# Patient Record
Sex: Female | Born: 1940 | Race: Black or African American | Hispanic: No | Smoking: Never smoker
Health system: Southern US, Community
[De-identification: ages and names within clinical notes are randomized; demographics above are authoritative.]

## PROBLEM LIST (undated history)

## (undated) DIAGNOSIS — E7800 Pure hypercholesterolemia, unspecified: Secondary | ICD-10-CM

## (undated) DIAGNOSIS — Z9889 Other specified postprocedural states: Secondary | ICD-10-CM

## (undated) DIAGNOSIS — F329 Major depressive disorder, single episode, unspecified: Secondary | ICD-10-CM

## (undated) DIAGNOSIS — M1990 Unspecified osteoarthritis, unspecified site: Secondary | ICD-10-CM

## (undated) DIAGNOSIS — F32A Depression, unspecified: Secondary | ICD-10-CM

## (undated) DIAGNOSIS — I10 Essential (primary) hypertension: Secondary | ICD-10-CM

## (undated) HISTORY — PX: UTERINE FIBROID SURGERY: SHX826

## (undated) HISTORY — DX: Essential (primary) hypertension: I10

## (undated) HISTORY — PX: TOTAL ABDOMINAL HYSTERECTOMY: SHX209

## (undated) HISTORY — DX: Pure hypercholesterolemia, unspecified: E78.00

## (undated) HISTORY — DX: Other specified postprocedural states: Z98.890

---

## 2001-07-26 ENCOUNTER — Encounter: Payer: Self-pay | Admitting: *Deleted

## 2001-07-26 ENCOUNTER — Ambulatory Visit (HOSPITAL_COMMUNITY): Admission: RE | Admit: 2001-07-26 | Discharge: 2001-07-26 | Payer: Self-pay | Admitting: *Deleted

## 2002-08-08 ENCOUNTER — Ambulatory Visit (HOSPITAL_COMMUNITY): Admission: RE | Admit: 2002-08-08 | Discharge: 2002-08-08 | Payer: Self-pay | Admitting: *Deleted

## 2002-08-08 ENCOUNTER — Encounter: Payer: Self-pay | Admitting: *Deleted

## 2003-08-19 ENCOUNTER — Ambulatory Visit (HOSPITAL_COMMUNITY): Admission: RE | Admit: 2003-08-19 | Discharge: 2003-08-19 | Payer: Self-pay | Admitting: Family Medicine

## 2004-08-31 ENCOUNTER — Ambulatory Visit (HOSPITAL_COMMUNITY): Admission: RE | Admit: 2004-08-31 | Discharge: 2004-08-31 | Payer: Self-pay | Admitting: Family Medicine

## 2005-02-17 ENCOUNTER — Ambulatory Visit: Payer: Self-pay | Admitting: Internal Medicine

## 2005-02-17 ENCOUNTER — Ambulatory Visit (HOSPITAL_COMMUNITY): Admission: RE | Admit: 2005-02-17 | Discharge: 2005-02-17 | Payer: Self-pay | Admitting: Internal Medicine

## 2005-02-17 HISTORY — PX: COLONOSCOPY: SHX174

## 2005-09-05 ENCOUNTER — Ambulatory Visit (HOSPITAL_COMMUNITY): Admission: RE | Admit: 2005-09-05 | Discharge: 2005-09-05 | Payer: Self-pay | Admitting: Family Medicine

## 2006-10-02 ENCOUNTER — Ambulatory Visit (HOSPITAL_COMMUNITY): Admission: RE | Admit: 2006-10-02 | Discharge: 2006-10-02 | Payer: Self-pay | Admitting: Family Medicine

## 2007-10-24 DIAGNOSIS — Z9889 Other specified postprocedural states: Secondary | ICD-10-CM

## 2007-10-24 HISTORY — DX: Other specified postprocedural states: Z98.890

## 2007-11-01 ENCOUNTER — Ambulatory Visit (HOSPITAL_COMMUNITY): Admission: RE | Admit: 2007-11-01 | Discharge: 2007-11-01 | Payer: Self-pay | Admitting: Family Medicine

## 2008-02-17 ENCOUNTER — Ambulatory Visit (HOSPITAL_COMMUNITY): Admission: RE | Admit: 2008-02-17 | Discharge: 2008-02-17 | Payer: Self-pay | Admitting: Internal Medicine

## 2008-02-17 ENCOUNTER — Encounter: Payer: Self-pay | Admitting: Internal Medicine

## 2008-02-17 ENCOUNTER — Ambulatory Visit: Payer: Self-pay | Admitting: Internal Medicine

## 2008-02-18 HISTORY — PX: COLONOSCOPY: SHX174

## 2008-11-06 ENCOUNTER — Ambulatory Visit (HOSPITAL_COMMUNITY): Admission: RE | Admit: 2008-11-06 | Discharge: 2008-11-06 | Payer: Self-pay | Admitting: Family Medicine

## 2009-11-08 ENCOUNTER — Ambulatory Visit (HOSPITAL_COMMUNITY): Admission: RE | Admit: 2009-11-08 | Discharge: 2009-11-08 | Payer: Self-pay | Admitting: Family Medicine

## 2010-11-10 ENCOUNTER — Ambulatory Visit (HOSPITAL_COMMUNITY)
Admission: RE | Admit: 2010-11-10 | Discharge: 2010-11-10 | Payer: Self-pay | Source: Home / Self Care | Attending: Family Medicine | Admitting: Family Medicine

## 2010-11-13 ENCOUNTER — Encounter: Payer: Self-pay | Admitting: Family Medicine

## 2010-11-23 ENCOUNTER — Encounter: Payer: Self-pay | Admitting: Family Medicine

## 2011-01-18 ENCOUNTER — Ambulatory Visit (INDEPENDENT_AMBULATORY_CARE_PROVIDER_SITE_OTHER): Payer: Medicare Other | Admitting: Gastroenterology

## 2011-01-18 ENCOUNTER — Encounter: Payer: Self-pay | Admitting: Gastroenterology

## 2011-01-18 VITALS — BP 122/78 | HR 104 | Temp 97.2°F | Ht 68.0 in | Wt 230.0 lb

## 2011-01-18 DIAGNOSIS — K6289 Other specified diseases of anus and rectum: Secondary | ICD-10-CM

## 2011-01-18 DIAGNOSIS — K629 Disease of anus and rectum, unspecified: Secondary | ICD-10-CM

## 2011-01-18 NOTE — Progress Notes (Signed)
Denies abdominal pain, no N/V, no GERD. No dysphagia, odynophagia. Has BM once/day. No blood in stool.  Noticed a "knot" a few weeks ago. Tried preparation H, still did not go away. Was using bathroom and had small amount of soreness, says it "moves". No itching.

## 2011-01-19 ENCOUNTER — Encounter: Payer: Medicare Other | Admitting: Internal Medicine

## 2011-01-19 DIAGNOSIS — K629 Disease of anus and rectum, unspecified: Secondary | ICD-10-CM

## 2011-01-19 LAB — IFOBT (OCCULT BLOOD): IFOBT: NEGATIVE

## 2011-01-19 NOTE — Progress Notes (Signed)
Referring Provider: No ref. provider found Primary Care Physician:  COMSTOCK,[place], [place]  Chief Complaint  Patient presents with  . Rectal Pain    taking shower and "checked" rectum and found a "knott"    HPI:  [name] is a  70 year old African American female who presents today with concerns regarding a knot in her rectum. She has essentially has no other complaints today. She denies any abdominal pain, nausea or vomiting. She has one bowel movement daily. She has seen no melena or bright red blood per rectum. She reports that a few weeks ago, at she was having a BM, she had a mild amount of discomfort in the rectum area. She actually digitally inserted her finger to check her rectum and felt a "knot". She used preparation H., but states it is not painful at all. She is just quite concerned about this as she does not understand why there is a knot in her rectum. Her last colonoscopy was performed in 2009. It was noted at that time that she had a rectal polyp and right colon polyp. Otherwise she had a normal rectum and colon. Again the biopsies were noted to be a tubular adenoma. She is due for repeat colonoscopy in 5 years.  Past Medical History  Diagnosis Date  . HTN (hypertension)   . Hypercholesteremia   . S/P colonoscopy 2009    adenoma, repeat 5 years    Past Surgical History  Procedure Date  . Total abdominal hysterectomy     fibroid tumors    Current Outpatient Prescriptions  Medication Sig Dispense Refill  . hydrochlorothiazide 25 MG tablet Take 25 mg by mouth daily.        Marland Kitchen lisinopril (PRINIVIL,ZESTRIL) 5 MG tablet Take 5 mg by mouth daily.        Marland Kitchen lovastatin (MEVACOR) 20 MG tablet Take 20 mg by mouth at bedtime.        . naproxen (NAPROSYN) 500 MG tablet Take 500 mg by mouth 2 (two) times daily with a meal.          Allergies as of 01/18/2011  . (No Known Allergies)    Family History  Problem Relation Age of Onset  . Stroke Mother     deceased  .  Emphysema Father     deceased    History   Social History  . Marital Status: Divorced    Spouse Name: N/A    Number of Children: N/A  . 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 Smoking status: Never Smoker   . Smokeless tobacco: Never Used  . Alcohol Use: No  . Drug Use: No  . Sexually Active: No   Other Topics Concern  . Not on file   Social History Narrative  . No narrative on file    Review of Systems: Gen: Denies any fever, chills, sweats, anorexia CV: Denies chest pain, angina, palpitations, syncope, orthopnea, PND, peripheral edema Resp: Denies dyspnea at rest, wheezing,  pleurisy. GI: SEE HPI Derm: Denies rash, itching, dry skin Psych: Denies depression, anxiety, memory loss, suicidal ideation, confusion Heme: Denies bruising, bleeding, and enlarged lymph nodes.  Physical Exam: BP 122/78  Pulse 104  Temp 97.2 F (36.2 C)  Ht 5\' 8"  (1.727 m)  Wt 230 lb (104.327 kg)  BMI 34.97 kg/m2 General:   Alert,  Well-developed, well-nourished, pleasant and cooperative in NAD Head:  Normocephalic and  atraumatic. Eyes:  Sclera clear, no icterus.   Conjunctiva pink. Mouth:  No deformity or lesions, dentition normal. Neck:  Supple; no masses or thyromegaly. Heart:  Regular rate and rhythm; no murmurs, clicks, rubs,  or gallops. Abdomen:  Soft, nontender and nondistended. No masses, hepatosplenomegaly or hernias noted. Normal bowel sounds, without guarding, and without rebound.   Msk:  Symmetrical without gross deformities. Normal posture. Rectal: external visualization showed evidence of several hemorrhoid tags, no thrombosed or bleeding hemorrhoids. DRE performed; good sphincter tone. Unable to appreciate any palpable masses; however, there was a small, almost grainy-like palpable area, like a piece of rice, non-fixed, posteriorly in rectal vault. Anoscope was inserted without any difficulties. No obvious lesions,  mass noted. No overt evidence of bleeding. Unable to appreciate supposed area of concern.   Extremities:  Without edema. Neurologic:  Alert and  oriented x4;  grossly normal neurologically. Skin:  Intact without significant lesions or rashes. Cervical Nodes:  No significant cervical adenopathy. Psych:  Alert and cooperative. Normal mood and affect.

## 2011-01-19 NOTE — Assessment & Plan Note (Signed)
70 year old [place] American female who presents with concerns regarding a rectal "knot" that she noticed 2 weeks ago while having a bowel movement. She denies any discomfort, itching or pain currently. She states that she felt the knot herself and was concerned. Denies melena or brbpr.  She has bowel movements every other day. Digital rectal exam revealed good sphincter tone and a small posterior area that was non-fixed about the size of a grain of rice. It is quite difficult to characterize what this is. Anoscope was used as well without any evidence of lesion or mass or if overt bleeding.  #1  we will obtain recent outside labs. If she has not had a CBC, we will have to proceed with this. #2 although her last colonoscopy was in 2009, we will proceed with an ifobt to assess for any occult bleeding. #3 after review of her labs and stool sample, we will consider proceeding with a flexible sigmoidoscopy in order to assess her internal rectal vault.

## 2011-01-20 NOTE — Progress Notes (Signed)
Pt aware ifobt is neg. And we will call with any further recomendations

## 2011-01-25 NOTE — Progress Notes (Signed)
You may inform her that we are waiting on outside labs that have been requested. She will need a CBC if not already done at outside facility.

## 2011-02-02 NOTE — Progress Notes (Unsigned)
Please have pt obtain a CBC. Need to assess where she is .

## 2011-02-06 ENCOUNTER — Other Ambulatory Visit: Payer: Self-pay | Admitting: Gastroenterology

## 2011-02-06 DIAGNOSIS — K6289 Other specified diseases of anus and rectum: Secondary | ICD-10-CM

## 2011-02-06 NOTE — Progress Notes (Signed)
Unable to reach pt- lab order and letter mailed to pt

## 2011-02-07 ENCOUNTER — Encounter: Payer: Self-pay | Admitting: Gastroenterology

## 2011-03-07 NOTE — Op Note (Signed)
NAMEDAGMAR, ADCOX               ACCOUNT NO.:  [id number]   MEDICAL RECORD NO.:  [id number]          PATIENT TYPE:  AMB   LOCATION:  DAY                           FACILITY:  [facility]   PHYSICIAN:  R. Roetta Sessions, M.D. DATE OF BIRTH:  02/22/1941   DATE OF PROCEDURE:  DATE OF DISCHARGE:                               OPERATIVE REPORT   INDICATIONS FOR PROCEDURE:  A 70 year old lady with a history of colonic  adenomas removed back in 2006.  She is here for surveillance.  Risks,  benefits, and alternatives have been reviewed, questions answered, and  she is agreeable.  Please see the documentation in the medical record.   PROCEDURE NOTE:  O2 saturation, blood pressure, pulse, and respirations  were monitored throughout the entire procedure.   CONSCIOUS SEDATION:  Versed 5 mg IV and Demerol 100 g IV in divided  doses.   INSTRUMENT:  Pentax video chip system.   FINDINGS:  Digital rectal exam revealed no abnormalities.  Endoscopic  findings:  The prep was good.  Colon:  Colonic mucosa was surveyed from  the rectosigmoid junction through the left, transverse, right colon,  to  the appendiceal orifice, ileocecal valve, and cecum.  These structures  were well seen and photographed for the record.  From this level, the  scope was slowly cautiously withdrawn.  All previously mentioned mucosal  surfaces were again seen.  The patient had a 7-mm polyp in the hepatic  flexure which was cold snared.  There were 2 diminutive polyps at the  base of the cecum, which were cold biopsied/removed.  The remainder of  the colonic mucosa appeared normal.  The patient had a relatively  haustral left and transverse segments which made the exam straight  forward.  The scope was pulled down to the rectum where thorough  examination of rectal mucosa including retroflexed view of the anal  verge demonstrated no abnormalities.  The patient tolerated the  procedure well and was reactive in endoscopy.   IMPRESSION:  1. Normal rectum.  2. Polyp in the hepatic flexure removed with snare.  Remainder of the      cecal polyps were cold biopsied/removed; remainder of the colonic      mucosa appeared normal.   RECOMMENDATIONS:  1. Follow up on path.  2. Further recommendations to follow.       Jonathon Bellows, M.D.  Electronically Signed     RMR/MEDQ  D:  02/17/2008  T:  02/18/2008  Job:  [id number]

## 2011-03-10 NOTE — Op Note (Signed)
[name], [name]               ACCOUNT NO.:  [id number]   MEDICAL RECORD NO.:  [id number]          PATIENT TYPE:  AMB   LOCATION:  DAY                           FACILITY:  [facility]   PHYSICIAN:  R. Roetta Sessions, M.D. DATE OF BIRTH:  10/11/1943   DATE OF PROCEDURE:  02/17/2005  DATE OF DISCHARGE:                                 OPERATIVE REPORT   PROCEDURE:  Colonoscopy with snare polypectomy.   INDICATION FOR PROCEDURE:  The patient is a 70 year old lady who is referred  out of courtesy of Dr. Bethena Midget in [place] for colorectal cancer  screening.  I performed a sigmoidoscopy on her back in 2000 for screening.  She had a negative exam to 60 cm.  She is devoid of any lower GI tract  symptoms.  Family history continues to be negative for colorectal neoplasia.  Colonoscopy is now being done as a screening maneuver.  This approach has  been discussed with the patient at length, the potential risks, benefits,  and alternatives have been reviewed, questions answered.  She is agreeable.  Please see documentation in the medical record.   PROCEDURE NOTE:  O2 saturation, blood pressure, pulse and respirations were  monitored throughout the entire procedure.   CONSCIOUS SEDATION:  Demerol 75 mg IV, Versed 4 mg IV in divided doses.   INSTRUMENT USED:  Olympus video chip system.   FINDINGS:  Digital rectal exam revealed no abnormalities.  Endoscopic  findings:  Prep was good.   Rectum:  Examination of the rectal mucosa including retroflexion of the anal  verge revealed a 4 mm polyp in at 15 cm from the anal verge.  Please see  photos.  The remainder of the rectal mucosa appeared normal.   Colon:  The colonic mucosa was surveyed from the rectosigmoid junction  through the left, transverse and right colon to the area of the appendiceal  orifice, ileocecal valve and cecum.  These structures were well-seen and  photographed for the record.  From this level the scope was slowly  withdrawn  and all previously-mentioned mucosal surfaces were again seen.  The patient  was noted to have a 7 mm polyp between two folds in the right colon just 5  cm distal to the ileocecal valve.  This lesion was resected with snare  cautery and recovered through the scope.  The remainder of the colonic  mucosa appeared normal.  The rectal polyp likewise was removed with snare  cautery and recovered through the scope.  The patient tolerated the  procedure very well, was reacted in endoscopy.   IMPRESSION:  1.  Rectal polyp and right colon polyp, status post snare polypectomy.  2.  Otherwise normal rectum and colon.   RECOMMENDATIONS:  1.  No aspirin or arthritis medications for the next 10 days.  2.  Follow up on pathology.  3.  Further recommendations to follow.      RMR/MEDQ  D:  02/17/2005  T:  02/17/2005  Job:  [id number]   cc:   Bethena Midget, M.D.  [facility]  [place], [place]

## 2011-05-09 NOTE — Telephone Encounter (Signed)
Opened in error

## 2011-10-27 ENCOUNTER — Other Ambulatory Visit (HOSPITAL_COMMUNITY): Payer: Self-pay | Admitting: Family Medicine

## 2011-10-27 DIAGNOSIS — Z139 Encounter for screening, unspecified: Secondary | ICD-10-CM

## 2011-11-13 ENCOUNTER — Ambulatory Visit (HOSPITAL_COMMUNITY)
Admission: RE | Admit: 2011-11-13 | Discharge: 2011-11-13 | Disposition: A | Discharge status: Home / Self Care | Payer: Medicare Other | Source: Ambulatory Visit | Attending: Family Medicine | Admitting: Family Medicine

## 2011-11-13 DIAGNOSIS — Z1231 Encounter for screening mammogram for malignant neoplasm of breast: Secondary | ICD-10-CM | POA: Insufficient documentation

## 2011-11-13 DIAGNOSIS — Z139 Encounter for screening, unspecified: Secondary | ICD-10-CM

## 2012-02-13 ENCOUNTER — Telehealth: Payer: Self-pay | Admitting: Gastroenterology

## 2012-02-13 NOTE — Telephone Encounter (Signed)
After review of chart, no labs performed when last seen. ifobt was negative, however. Let's have pt return for a routine visit (non-urgent) to see how she is doing.  Reason: f/u rectal concerns, consider TCS if indicated.

## 2012-02-22 ENCOUNTER — Encounter: Payer: Self-pay | Admitting: Internal Medicine

## 2012-02-22 NOTE — Telephone Encounter (Signed)
Mailed letter to patient to call office to set up OV °

## 2012-10-23 ENCOUNTER — Other Ambulatory Visit: Payer: Self-pay | Admitting: Occupational Therapy

## 2012-10-23 ENCOUNTER — Other Ambulatory Visit (HOSPITAL_COMMUNITY): Payer: Self-pay | Admitting: Nurse Practitioner

## 2012-10-23 DIAGNOSIS — Z139 Encounter for screening, unspecified: Secondary | ICD-10-CM

## 2012-11-12 ENCOUNTER — Ambulatory Visit (HOSPITAL_COMMUNITY): Payer: Medicare Other

## 2012-11-14 ENCOUNTER — Ambulatory Visit (HOSPITAL_COMMUNITY)
Admission: RE | Admit: 2012-11-14 | Discharge: 2012-11-14 | Disposition: A | Discharge status: Home / Self Care | Payer: Medicare Other | Source: Ambulatory Visit | Attending: Nurse Practitioner | Admitting: Nurse Practitioner

## 2012-11-14 DIAGNOSIS — Z1231 Encounter for screening mammogram for malignant neoplasm of breast: Secondary | ICD-10-CM | POA: Insufficient documentation

## 2012-11-14 DIAGNOSIS — Z139 Encounter for screening, unspecified: Secondary | ICD-10-CM

## 2013-03-04 ENCOUNTER — Telehealth: Payer: Self-pay | Admitting: Internal Medicine

## 2013-03-04 NOTE — Telephone Encounter (Signed)
I called and spoke to pt and she has OV with Gerrit Halls, NP on 03/18/2013 at 2:30 PM. ( She has hx of tubular adenoma ) She is asking to have an appt letter mailed to her with address and time and I am doing so now.

## 2013-03-04 NOTE — Telephone Encounter (Signed)
Thank you :)

## 2013-03-04 NOTE — Telephone Encounter (Signed)
Pt called to see when we were going to set up her next colonoscopy. I told her that see was due this month for a 5 year colonoscopy and she would need to come in for an OV first prior to setting her up. She refuses OV because she has never had to come in before. I tried to explain that our policy was if you have a Hx of polyps or family history of cancer that we bring patients into the office versus being triaged. She does not want OV and said the nurse can set it up and mail her the information. I tried to tell her that the nurse will need to speak with her to get some information first and she said that the nurse could call her in the afternoons and DO NOT call her in the mornings. Please call and advise after 2pm. [phone]

## 2013-03-14 ENCOUNTER — Encounter: Payer: Self-pay | Admitting: Internal Medicine

## 2013-03-18 ENCOUNTER — Encounter: Payer: Self-pay | Admitting: Gastroenterology

## 2013-03-18 ENCOUNTER — Ambulatory Visit (INDEPENDENT_AMBULATORY_CARE_PROVIDER_SITE_OTHER): Payer: Medicare Other | Admitting: Gastroenterology

## 2013-03-18 VITALS — BP 109/75 | HR 109 | Temp 97.4°F | Ht 69.0 in | Wt 236.6 lb

## 2013-03-18 DIAGNOSIS — D126 Benign neoplasm of colon, unspecified: Secondary | ICD-10-CM

## 2013-03-18 MED ORDER — PEG 3350-KCL-NA BICARB-NACL 420 G PO SOLR: 4000.0000 mL | ORAL | Status: DC

## 2013-03-18 NOTE — Patient Instructions (Addendum)
We have scheduled you for a colonoscopy with Dr. Rourk in the near future.  Further recommendations to follow!   

## 2013-03-18 NOTE — Progress Notes (Signed)
Referring Provider: Reynolds Bowl, MD Primary Care Physician:  Reynolds Bowl, MD Primary GI: Dr. Jena Gauss   Chief Complaint  Patient presents with  . Colonoscopy    HPI:   [name] is a pleasant 72 year old female with a history of adenomatous polyps, due for routine surveillance. Last colonoscopy in 2009 by Dr. Jena Gauss. +Constipation, bloating, gas. BM usually every day but reports lower abdominal discomfort. Improved after a BM. Intermittent. Small amount. Present X 1 month. Scant hematochezia with straining. No N/V. No GERD. No dysphagia. Good appetite.   Past Medical History  Diagnosis Date  . HTN (hypertension)   . Hypercholesteremia   . S/P colonoscopy 2009    adenoma, repeat 5 years    Past Surgical History  Procedure Laterality Date  . Total abdominal hysterectomy      fibroid tumors  . Colonoscopy  02/17/2005    YNW:GNFAOZ polyp and right colon polyp, status post snare polypectomy/Otherwise normal rectum and colon  . Colonoscopy  02/18/2008    RMR: Normal rectum/Polyp in the hepatic flexure removed with snare cecal polyps were cold biopsied/removed; remainder of the colonic mucosa appeared normal. Tubluar adenoma    Current Outpatient Prescriptions  Medication Sig Dispense Refill  . hydrochlorothiazide 25 MG tablet Take 25 mg by mouth daily.        Marland Kitchen lisinopril (PRINIVIL,ZESTRIL) 5 MG tablet Take 5 mg by mouth daily.        Marland Kitchen lovastatin (MEVACOR) 20 MG tablet Take 20 mg by mouth at bedtime.        . naproxen (NAPROSYN) 500 MG tablet Take 500 mg by mouth 2 (two) times daily with a meal.         No current facility-administered medications for this visit.    Allergies as of 03/18/2013  . (No Known Allergies)    Family History  Problem Relation Age of Onset  . Stroke Mother     deceased  . Emphysema Father     deceased  . Colon cancer Neg Hx     History   Social History  . Marital Status: Divorced    Spouse Name: N/A    Number of Children: N/A  .  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 Smoking status: Never Smoker   . Smokeless tobacco: Never Used  . Alcohol Use: No  . Drug Use: No  . Sexually Active: No   Other Topics Concern  . None   Social History Narrative  . None    Review of Systems: Gen: Denies fever, chills, anorexia. Denies fatigue, weakness, weight loss.  CV: Denies chest pain, palpitations, syncope, peripheral edema, and claudication. Resp: Denies dyspnea at rest, cough, wheezing, coughing up blood, and pleurisy. GI: SEE HPI Derm: Denies rash, itching, dry skin Psych: Denies depression, anxiety, memory loss, confusion. No homicidal or suicidal ideation.  Heme: Denies bruising, bleeding, and enlarged lymph nodes.  Physical Exam: BP 109/75  Pulse 109  Temp(Src) 97.4 F (36.3 C) (Oral)  Ht 5\' 9"  (1.753 m)  Wt 236 lb 9.6 oz (107.321 kg)  BMI 34.92 kg/m2 General:   Alert and oriented. No distress noted. Pleasant and cooperative.  Head:  Normocephalic and atraumatic. Eyes:  Conjuctiva clear without scleral icterus. Mouth:  Oral mucosa pink and moist. Good dentition. No lesions. Neck:  Supple, without mass or thyromegaly. Heart:  S1, S2 present without murmurs, rubs, or gallops. Regular  rate and rhythm. Abdomen:  +BS, soft, non-tender and non-distended. No rebound or guarding. No HSM or masses noted. Msk:  Symmetrical without gross deformities. Normal posture. Extremities:  Left pedal edema, chronic per patient Neurologic:  Alert and  oriented x4;  grossly normal neurologically. Skin:  Intact without significant lesions or rashes. Cervical Nodes:  No significant cervical adenopathy. Psych:  Alert and cooperative. Normal mood and affect.

## 2013-03-19 ENCOUNTER — Encounter (HOSPITAL_COMMUNITY): Payer: Self-pay | Admitting: Pharmacy Technician

## 2013-03-19 NOTE — Progress Notes (Signed)
Cc PCP 

## 2013-03-19 NOTE — Assessment & Plan Note (Signed)
72 year old female due for surveillance colonoscopy at this time due to history of adenomatous polyps; last colonoscopy in 2009 by Dr. Jena Gauss. She has scant hematochezia in the presence of intermittent constipation, likely benign. May benefit from Linzess in the future.  Proceed with TCS with Dr. Jena Gauss in near future: the risks, benefits, and alternatives have been discussed with the patient in detail. The patient states understanding and desires to proceed.

## 2013-03-24 ENCOUNTER — Telehealth: Payer: Self-pay | Admitting: Internal Medicine

## 2013-03-24 NOTE — Telephone Encounter (Signed)
I LMOM for patient to call back

## 2013-03-24 NOTE — Telephone Encounter (Signed)
Pt LMOM over the weekend. She is scheduled on 6/4 with RMR. [id number]

## 2013-03-26 ENCOUNTER — Ambulatory Visit (HOSPITAL_COMMUNITY)
Admission: RE | Admit: 2013-03-26 | Discharge: 2013-03-26 | Disposition: A | Discharge status: Home / Self Care | Payer: Medicare Other | Source: Ambulatory Visit | Attending: Internal Medicine | Admitting: Internal Medicine

## 2013-03-26 ENCOUNTER — Encounter (HOSPITAL_COMMUNITY): Payer: Self-pay | Admitting: *Deleted

## 2013-03-26 ENCOUNTER — Encounter (HOSPITAL_COMMUNITY)
Admission: RE | Disposition: A | Discharge status: Home / Self Care | Payer: Self-pay | Source: Ambulatory Visit | Attending: Internal Medicine

## 2013-03-26 DIAGNOSIS — Z860100 Personal history of colon polyps, unspecified: Secondary | ICD-10-CM | POA: Insufficient documentation

## 2013-03-26 DIAGNOSIS — I10 Essential (primary) hypertension: Secondary | ICD-10-CM | POA: Insufficient documentation

## 2013-03-26 DIAGNOSIS — Z1211 Encounter for screening for malignant neoplasm of colon: Secondary | ICD-10-CM

## 2013-03-26 DIAGNOSIS — E7800 Pure hypercholesterolemia, unspecified: Secondary | ICD-10-CM | POA: Insufficient documentation

## 2013-03-26 DIAGNOSIS — D126 Benign neoplasm of colon, unspecified: Secondary | ICD-10-CM

## 2013-03-26 HISTORY — PX: COLONOSCOPY: SHX5424

## 2013-03-26 SURGERY — COLONOSCOPY
Anesthesia: Moderate Sedation

## 2013-03-26 MED ORDER — MIDAZOLAM HCL 5 MG/5ML IJ SOLN
INTRAMUSCULAR | Status: DC | PRN
  Administered 2013-03-26: 2 mg via INTRAVENOUS
  Administered 2013-03-26: 1 mg via INTRAVENOUS
  Administered 2013-03-26: 2 mg via INTRAVENOUS

## 2013-03-26 MED ORDER — MEPERIDINE HCL 100 MG/ML IJ SOLN
INTRAMUSCULAR | Status: DC | PRN
  Administered 2013-03-26 (×2): 25 mg via INTRAVENOUS
  Administered 2013-03-26: 50 mg via INTRAVENOUS

## 2013-03-26 MED ORDER — MEPERIDINE HCL 100 MG/ML IJ SOLN
INTRAMUSCULAR | Status: AC
  Filled 2013-03-26: qty 2

## 2013-03-26 MED ORDER — ONDANSETRON HCL 4 MG/2ML IJ SOLN
INTRAMUSCULAR | Status: DC | PRN
  Administered 2013-03-26: 4 mg via INTRAVENOUS

## 2013-03-26 MED ORDER — MIDAZOLAM HCL 5 MG/5ML IJ SOLN
INTRAMUSCULAR | Status: AC
  Filled 2013-03-26: qty 10

## 2013-03-26 MED ORDER — ONDANSETRON HCL 4 MG/2ML IJ SOLN
INTRAMUSCULAR | Status: AC
  Filled 2013-03-26: qty 2

## 2013-03-26 MED ORDER — SODIUM CHLORIDE 0.9 % IV SOLN
INTRAVENOUS | Status: DC
  Administered 2013-03-26: 1000 mL via INTRAVENOUS

## 2013-03-26 MED ORDER — STERILE WATER FOR IRRIGATION IR SOLN
Status: DC | PRN
  Administered 2013-03-26: 13:00:00

## 2013-03-26 NOTE — Interval H&P Note (Signed)
History and Physical Interval Note:  03/26/2013 1:21 PM  [name]  has presented today for surgery, with the diagnosis of ADENOMATOUS POLYPS  The various methods of treatment have been discussed with the patient and family. After consideration of risks, benefits and other options for treatment, the patient has consented to  Procedure(s) with comments: COLONOSCOPY (N/A) - 1:15 as a surgical intervention .  The patient's history has been reviewed, patient examined, no change in status, stable for surgery.  I have reviewed the patient's chart and labs.  Questions were answered to the patient's satisfaction.     [name]  Colonoscopy today per plan.The risks, benefits, limitations, alternatives and imponderables have been reviewed with the patient. Questions have been answered. All parties are agreeable.

## 2013-03-26 NOTE — H&P (View-Only) (Signed)
 Referring Provider: Comstock, Lloyd, MD Primary Care Physician:  COMSTOCK, LLOYD, MD Primary GI: Dr. Rourk   Chief Complaint  Patient presents with  . Colonoscopy    HPI:   [name] is a pleasant 71-year-old female with a history of adenomatous polyps, due for routine surveillance. Last colonoscopy in 2009 by Dr. Rourk. +Constipation, bloating, gas. BM usually every day but reports lower abdominal discomfort. Improved after a BM. Intermittent. Small amount. Present X 1 month. Scant hematochezia with straining. No N/V. No GERD. No dysphagia. Good appetite.   Past Medical History  Diagnosis Date  . HTN (hypertension)   . Hypercholesteremia   . S/P colonoscopy 2009    adenoma, repeat 5 years    Past Surgical History  Procedure Laterality Date  . Total abdominal hysterectomy      fibroid tumors  . Colonoscopy  02/17/2005    RMR:Rectal polyp and right colon polyp, status post snare polypectomy/Otherwise normal rectum and colon  . Colonoscopy  02/18/2008    RMR: Normal rectum/Polyp in the hepatic flexure removed with snare cecal polyps were cold biopsied/removed; remainder of the colonic mucosa appeared normal. Tubluar adenoma    Current Outpatient Prescriptions  Medication Sig Dispense Refill  . hydrochlorothiazide 25 MG tablet Take 25 mg by mouth daily.        . lisinopril (PRINIVIL,ZESTRIL) 5 MG tablet Take 5 mg by mouth daily.        . lovastatin (MEVACOR) 20 MG tablet Take 20 mg by mouth at bedtime.        . naproxen (NAPROSYN) 500 MG tablet Take 500 mg by mouth 2 (two) times daily with a meal.         No current facility-administered medications for this visit.    Allergies as of 03/18/2013  . (No Known Allergies)    Family History  Problem Relation Age of Onset  . Stroke Mother     deceased  . Emphysema Father     deceased  . Colon cancer Neg Hx     History   Social History  . Marital Status: Divorced    Spouse Name: N/A    Number of Children: N/A  .  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 Smoking status: Never Smoker   . Smokeless tobacco: Never Used  . Alcohol Use: No  . Drug Use: No  . Sexually Active: No   Other Topics Concern  . None   Social History Narrative  . None    Review of Systems: Gen: Denies fever, chills, anorexia. Denies fatigue, weakness, weight loss.  CV: Denies chest pain, palpitations, syncope, peripheral edema, and claudication. Resp: Denies dyspnea at rest, cough, wheezing, coughing up blood, and pleurisy. GI: SEE HPI Derm: Denies rash, itching, dry skin Psych: Denies depression, anxiety, memory loss, confusion. No homicidal or suicidal ideation.  Heme: Denies bruising, bleeding, and enlarged lymph nodes.  Physical Exam: BP 109/75  Pulse 109  Temp(Src) 97.4 F (36.3 C) (Oral)  Ht 5' 9" (1.753 m)  Wt 236 lb 9.6 oz (107.321 kg)  BMI 34.92 kg/m2 General:   Alert and oriented. No distress noted. Pleasant and cooperative.  Head:  Normocephalic and atraumatic. Eyes:  Conjuctiva clear without scleral icterus. Mouth:  Oral mucosa pink and moist. Good dentition. No lesions. Neck:  Supple, without mass or thyromegaly. Heart:  S1, S2 present without murmurs, rubs, or gallops. Regular   rate and rhythm. Abdomen:  +BS, soft, non-tender and non-distended. No rebound or guarding. No HSM or masses noted. Msk:  Symmetrical without gross deformities. Normal posture. Extremities:  Left pedal edema, chronic per patient Neurologic:  Alert and  oriented x4;  grossly normal neurologically. Skin:  Intact without significant lesions or rashes. Cervical Nodes:  No significant cervical adenopathy. Psych:  Alert and cooperative. Normal mood and affect.  

## 2013-03-26 NOTE — Op Note (Signed)
[facility] [address] [place], [address]   COLONOSCOPY PROCEDURE REPORT  PATIENT: Denise, [name]  MR#:         [id number] BIRTHDATE: 11-May-1941 , 71  yrs. old GENDER: Female ENDOSCOPIST: R.  Roetta Sessions, MD FACP [facility] REFERRED BY:  Reynolds Bowl, M.D. PROCEDURE DATE:  03/26/2013 PROCEDURE:     Colonoscopy with biopsy  INDICATIONS: history of colonic adenoma  INFORMED CONSENT:  The risks, benefits, alternatives and imponderables including but not limited to bleeding, perforation as well as the possibility of a missed lesion have been reviewed.  The potential for biopsy, lesion removal, etc. have also been discussed.  Questions have been answered.  All parties agreeable. Please see the history and physical in the medical record for more information.  MEDICATIONS: Versed 5 mg IV and Demerol 100 mg IV in divided doses. Zofran 4 mg IV  DESCRIPTION OF PROCEDURE:  After a digital rectal exam was performed, the EC-[id number] ([id number])  colonoscope was advanced from the anus through the rectum and colon to the area of the cecum, ileocecal valve and appendiceal orifice.  The cecum was deeply intubated.  These structures were well-seen and photographed for the record.  From the level of the cecum and ileocecal valve, the scope was slowly and cautiously withdrawn.  The mucosal surfaces were carefully surveyed utilizing scope tip deflection to facilitate fold flattening as needed.  The scope was pulled down into the rectum where a thorough examination including retroflexion was performed.    FINDINGS:  Adequate preparation. Normal rectum. (1) 2 mm polyp in the base of the cecum; otherwise, the remainder of the colonic mucosa appeared normal.  THERAPEUTIC / DIAGNOSTIC MANEUVERS PERFORMED:  The above-mentioned polyp wascold biopsied/removed.  COMPLICATIONS: None  CECAL WITHDRAWAL TIME:  6 minutes  IMPRESSION:  Single colonic polyp-removed as described  above  RECOMMENDATIONS: Followup up on pathology.   _______________________________ eSigned:  R. Roetta Sessions, MD FACP [facility] 03/26/2013 1:52 PM   CC:    PATIENT NAME:  Denise, [name] MR#: [id number]

## 2013-03-28 ENCOUNTER — Encounter: Payer: Self-pay | Admitting: Internal Medicine

## 2013-03-31 ENCOUNTER — Encounter (HOSPITAL_COMMUNITY): Payer: Self-pay | Admitting: Internal Medicine

## 2013-06-05 ENCOUNTER — Emergency Department (HOSPITAL_COMMUNITY)
Admission: EM | Admit: 2013-06-05 | Discharge: 2013-06-05 | Disposition: A | Discharge status: Home / Self Care | Payer: Medicare Other | Attending: Emergency Medicine | Admitting: Emergency Medicine

## 2013-06-05 ENCOUNTER — Emergency Department (HOSPITAL_COMMUNITY): Payer: Medicare Other

## 2013-06-05 ENCOUNTER — Encounter (HOSPITAL_COMMUNITY): Payer: Self-pay | Admitting: Emergency Medicine

## 2013-06-05 DIAGNOSIS — M5416 Radiculopathy, lumbar region: Secondary | ICD-10-CM

## 2013-06-05 DIAGNOSIS — IMO0002 Reserved for concepts with insufficient information to code with codable children: Secondary | ICD-10-CM | POA: Insufficient documentation

## 2013-06-05 DIAGNOSIS — I10 Essential (primary) hypertension: Secondary | ICD-10-CM | POA: Insufficient documentation

## 2013-06-05 DIAGNOSIS — E7800 Pure hypercholesterolemia, unspecified: Secondary | ICD-10-CM | POA: Insufficient documentation

## 2013-06-05 DIAGNOSIS — Z79899 Other long term (current) drug therapy: Secondary | ICD-10-CM | POA: Insufficient documentation

## 2013-06-05 DIAGNOSIS — Z8659 Personal history of other mental and behavioral disorders: Secondary | ICD-10-CM | POA: Insufficient documentation

## 2013-06-05 HISTORY — DX: Depression, unspecified: F32.A

## 2013-06-05 HISTORY — DX: Major depressive disorder, single episode, unspecified: F32.9

## 2013-06-05 LAB — URINALYSIS, ROUTINE W REFLEX MICROSCOPIC
Nitrite: NEGATIVE
Specific Gravity, Urine: >1.03 — ABNORMAL HIGH (ref 1.005–1.030)
Urobilinogen, UA: 2 mg/dL — ABNORMAL HIGH (ref 0.0–1.0)
pH: 5.5 (ref 5.0–8.0)

## 2013-06-05 LAB — URINE MICROSCOPIC-ADD ON

## 2013-06-05 MED ORDER — HYDROCODONE-ACETAMINOPHEN 5-325 MG PO TABS: ORAL_TABLET | ORAL | Status: DC

## 2013-06-05 MED ORDER — HYDROCODONE-ACETAMINOPHEN 5-325 MG PO TABS
1.0000 | ORAL_TABLET | Freq: Once | ORAL | Status: AC
  Administered 2013-06-05: 1 via ORAL
  Filled 2013-06-05: qty 1

## 2013-06-05 NOTE — ED Notes (Signed)
Patient c/o lower back pain that alternates sides radiating down legs. Per patient started last week while moving bags of clothes. Patient reports taking ibuprofen 600mg  with no relief.

## 2013-06-05 NOTE — ED Notes (Signed)
Pt c/o lower back pain x1 week. Pt states pain began after moving a heavy bag of clothes. Pain becomes worse with any movement. Pt was seen for pain previously and given 600mg  ibuprofen for home. Pt denies any change in symptoms.

## 2013-06-05 NOTE — ED Provider Notes (Signed)
CSN: [id number]     Arrival date & time 06/05/13  1478 History     First MD Initiated Contact with Patient 06/05/13 [phone]     Chief Complaint  Patient presents with  . Back Pain   (Consider location/radiation/quality/duration/timing/severity/associated sxs/prior Treatment) HPI Comments: [name] is a 72 y.o. female who presents to the Emergency Department complaining of low back pain for one week.  States the pain began after lifting heavy bags of clothing.  She states she was seen by her PMD for same and was given Ibuprofen which is not controlling her pain.  She states the pain is mostly in her lower back but also states that "it moves from leg to leg".  She currently states the pain is radiating into her left leg down to her thigh.  She denies numbness, incontinence of bowel or bladder, dysuria, abdominal pain, or fever. No hx of trauma  Patient is a 72 y.o. female presenting with back pain. The history is provided by the patient.  Back Pain Location:  Lumbar spine Quality:  Aching Radiates to:  L posterior upper leg Pain severity:  Moderate Pain is:  Same all the time Onset quality:  Gradual Duration:  1 week Timing:  Constant Progression:  Worsening Chronicity:  New Context: lifting heavy objects and twisting   Relieved by:  Bed rest Worsened by:  Ambulation, bending, standing and twisting Ineffective treatments:  NSAIDs Associated symptoms: leg pain   Associated symptoms: no abdominal pain, no abdominal swelling, no bladder incontinence, no bowel incontinence, no chest pain, no dysuria, no fever, no headaches, no numbness, no paresthesias, no pelvic pain, no perianal numbness, no tingling and no weakness     Past Medical History  Diagnosis Date  . HTN (hypertension)   . Hypercholesteremia   . S/P colonoscopy 2009    adenoma, repeat 5 years  . Depression    Past Surgical History  Procedure Laterality Date  . Total abdominal hysterectomy      fibroid tumors  .  Colonoscopy  02/17/2005    OZH:[id number] polyp and right colon polyp, status post snare polypectomy/Otherwise normal rectum and colon  . Colonoscopy  02/18/2008    RMR: Normal rectum/Polyp in the hepatic flexure removed with snare cecal polyps were cold biopsied/removed; remainder of the colonic mucosa appeared normal. Tubluar adenoma  . Colonoscopy N/A 03/26/2013    Procedure: COLONOSCOPY;  Surgeon: Corbin Ade, MD;  Location: [facility];  Service: Endoscopy;  Laterality: N/A;  1:15  . Uterine fibroid surgery     Family History  Problem Relation Age of Onset  . Stroke Mother     deceased  . Emphysema Father     deceased  . Colon cancer Neg Hx   . Stroke Brother    History  Substance Use Topics  . Smoking status: Never Smoker   . Smokeless tobacco: Never Used  . Alcohol Use: No   OB History   Grav Para Term Preterm Abortions TAB SAB Ect Mult Living   3 3 3       3      Review of Systems  Constitutional: Negative for fever.  Respiratory: Negative for shortness of breath.   Cardiovascular: Negative for chest pain.  Gastrointestinal: Negative for vomiting, abdominal pain, constipation and bowel incontinence.  Genitourinary: Negative for bladder incontinence, dysuria, hematuria, flank pain, decreased urine volume, difficulty urinating and pelvic pain.       No perineal numbness or incontinence of urine or feces  Musculoskeletal: Positive for back pain. Negative for joint swelling.  Skin: Negative for rash.  Neurological: Negative for tingling, weakness, numbness, headaches and paresthesias.  All other systems reviewed and are negative.    Allergies  Review of patient's allergies indicates no known allergies.  Home Medications   Current Outpatient Rx  Name  Route  Sig  Dispense  Refill  . hydrochlorothiazide 25 MG tablet   Oral   Take 25 mg by mouth daily.           Marland Kitchen ibuprofen (ADVIL,MOTRIN) 600 MG tablet   Oral   Take 600 mg by mouth 3 (three) times daily as  needed (back pain).         Marland Kitchen lisinopril (PRINIVIL,ZESTRIL) 5 MG tablet   Oral   Take 5 mg by mouth daily.           Marland Kitchen lovastatin (MEVACOR) 20 MG tablet   Oral   Take 20 mg by mouth at bedtime.            BP 130/81  Pulse 107  Temp(Src) 97.9 F (36.6 C) (Oral)  Resp 21  Ht 5\' 9"  (1.753 m)  Wt 227 lb (102.967 kg)  BMI 33.51 kg/m2  SpO2 98% Physical Exam  Nursing note and vitals reviewed. Constitutional: She is oriented to person, place, and time. She appears well-developed and well-nourished. No distress.  HENT:  Head: Normocephalic and atraumatic.  Neck: Normal range of motion. Neck supple.  Cardiovascular: Normal rate, regular rhythm, normal heart sounds and intact distal pulses.   No murmur heard. Pulmonary/Chest: Effort normal and breath sounds normal. No respiratory distress.  Abdominal: Soft. She exhibits no distension. There is no tenderness. There is no rebound and no guarding.  Musculoskeletal: She exhibits tenderness. She exhibits no edema.       Lumbar back: She exhibits tenderness and pain. She exhibits normal range of motion, no swelling, no deformity, no laceration and normal pulse.  ttp of the bilateral lumbar paraspinal muscles.  No spinal tenderness.  DP pulses are brisk and symmetrical.  Distal sensation intact.  Hip Flexors/Extensors are intact  Neurological: She is alert and oriented to person, place, and time. No cranial nerve deficit or sensory deficit. She exhibits normal muscle tone. Coordination and gait normal.  Reflex Scores:      Patellar reflexes are 2+ on the right side and 2+ on the left side.      Achilles reflexes are 2+ on the right side and 2+ on the left side. Skin: Skin is warm and dry.    ED Course   Procedures (including critical care time)  Labs Reviewed  URINALYSIS, ROUTINE W REFLEX MICROSCOPIC - Abnormal; Notable for the following:    Specific Gravity, Urine >1.030 (*)    Hgb urine dipstick TRACE (*)    Bilirubin Urine  SMALL (*)    Urobilinogen, UA 2.0 (*)    Leukocytes, UA MODERATE (*)    All other components within normal limits  URINE MICROSCOPIC-ADD ON - Abnormal; Notable for the following:    Squamous Epithelial / LPF MANY (*)    Bacteria, UA MANY (*)    All other components within normal limits  URINE CULTURE   Dg Lumbar Spine Complete  06/05/2013   *RADIOLOGY REPORT*  Clinical Data: Low back pain.  LUMBAR SPINE - COMPLETE 4+ VIEW  Comparison: None.  Findings: Alignment is anatomic.  Vertebral body height is maintained.  Mild endplate degenerative changes at L3-4.  Moderate degenerative endplate  changes at L4-5 with slight loss of disc space height.  Advanced endplate degenerative changes and loss of disc space height at L5-S1.  Facet hypertrophy in the mid and lower lumbar spine.  No definite pars defects.  A fair amount of stool is seen in the colon.  IMPRESSION: Multilevel spondylosis, worst at L5-S1.   Original Report Authenticated By: Leanna Battles, M.D.   Urine culture is pending.    MDM     Patient is feeling better, pain improved. Now rates pain at "4" down from "10".  No focal neuro deficits, ambulates with a steady gait.  Discussed x-ray results with the patient including constipation.  I doubt emergent neurological or infectious process.     She appears stable for discharge.  Denies urinary sx's, so U/A is likely contaminant.   She agrees to f/u with her PMD for recheck and to increase fluids and take OTC Miralax as directed for constipation  Miyo Aina L. Trisha Mangle, PA-C 06/06/13 2148

## 2013-06-06 LAB — URINE CULTURE

## 2013-06-08 NOTE — ED Provider Notes (Signed)
Medical screening examination/treatment/procedure(s) were performed by non-physician practitioner and as supervising physician I was immediately available for consultation/collaboration.    Vida Roller, MD 06/08/13 [phone]

## 2013-09-10 ENCOUNTER — Encounter (HOSPITAL_COMMUNITY): Payer: Self-pay

## 2013-09-10 ENCOUNTER — Encounter (HOSPITAL_COMMUNITY)
Admission: RE | Admit: 2013-09-10 | Discharge: 2013-09-10 | Disposition: A | Discharge status: Home / Self Care | Payer: Medicare Other | Source: Ambulatory Visit | Attending: Ophthalmology | Admitting: Ophthalmology

## 2013-09-10 ENCOUNTER — Other Ambulatory Visit: Payer: Self-pay

## 2013-09-10 ENCOUNTER — Encounter (HOSPITAL_COMMUNITY): Payer: Self-pay | Admitting: Pharmacy Technician

## 2013-09-10 DIAGNOSIS — Z01810 Encounter for preprocedural cardiovascular examination: Secondary | ICD-10-CM | POA: Insufficient documentation

## 2013-09-10 DIAGNOSIS — Z01812 Encounter for preprocedural laboratory examination: Secondary | ICD-10-CM | POA: Insufficient documentation

## 2013-09-10 DIAGNOSIS — Z01818 Encounter for other preprocedural examination: Secondary | ICD-10-CM | POA: Insufficient documentation

## 2013-09-10 HISTORY — DX: Unspecified osteoarthritis, unspecified site: M19.90

## 2013-09-10 LAB — HEMOGLOBIN AND HEMATOCRIT, BLOOD
HCT: 38.9 % (ref 36.0–46.0)
Hemoglobin: 12.5 g/dL (ref 12.0–15.0)

## 2013-09-10 LAB — BASIC METABOLIC PANEL
GFR calc Af Amer: 85 mL/min — ABNORMAL LOW (ref >90)
GFR calc non Af Amer: 73 mL/min — ABNORMAL LOW (ref >90)
Potassium: 3.7 mEq/L (ref 3.5–5.1)
Sodium: 140 mEq/L (ref 135–145)

## 2013-09-10 NOTE — Patient Instructions (Signed)
Your procedure is scheduled on: 09/16/2013  Report to [facility] at   700  AM.  Call this number if you have problems the morning of surgery: [phone]   Do not eat food or drink liquids :After Midnight.      Take these medicines the morning of surgery with A SIP OF WATER: norco, lisinopril   Do not wear jewelry, make-up or nail polish.  Do not wear lotions, powders, or perfumes.   Do not shave 48 hours prior to surgery.  Do not bring valuables to the hospital.  Contacts, dentures or bridgework may not be worn into surgery.  Leave suitcase in the car. After surgery it may be brought to your room.  For patients admitted to the hospital, checkout time is 11:00 AM the day of discharge.   Patients discharged the day of surgery will not be allowed to drive home.  :     Please read over the following fact sheets that you were given: Coughing and Deep Breathing, Surgical Site Infection Prevention, Anesthesia Post-op Instructions and Care and Recovery After Surgery    Cataract A cataract is a clouding of the lens of the eye. When a lens becomes cloudy, vision is reduced based on the degree and nature of the clouding. Many cataracts reduce vision to some degree. Some cataracts make people more near-sighted as they develop. Other cataracts increase glare. Cataracts that are ignored and become worse can sometimes look white. The white color can be seen through the pupil. CAUSES   Aging. However, cataracts may occur at any age, even in newborns.   Certain drugs.   Trauma to the eye.   Certain diseases such as diabetes.   Specific eye diseases such as chronic inflammation inside the eye or a sudden attack of a rare form of glaucoma.   Inherited or acquired medical problems.  SYMPTOMS   Gradual, progressive drop in vision in the affected eye.   Severe, rapid visual loss. This most often happens when trauma is the cause.  DIAGNOSIS  To detect a cataract, an eye doctor examines the lens.  Cataracts are best diagnosed with an exam of the eyes with the pupils enlarged (dilated) by drops.  TREATMENT  For an early cataract, vision may improve by using different eyeglasses or stronger lighting. If that does not help your vision, surgery is the only effective treatment. A cataract needs to be surgically removed when vision loss interferes with your everyday activities, such as driving, reading, or watching TV. A cataract may also have to be removed if it prevents examination or treatment of another eye problem. Surgery removes the cloudy lens and usually replaces it with a substitute lens (intraocular lens, IOL).  At a time when both you and your doctor agree, the cataract will be surgically removed. If you have cataracts in both eyes, only one is usually removed at a time. This allows the operated eye to heal and be out of danger from any possible problems after surgery (such as infection or poor wound healing). In rare cases, a cataract may be doing damage to your eye. In these cases, your caregiver may advise surgical removal right away. The vast majority of people who have cataract surgery have better vision afterward. HOME CARE INSTRUCTIONS  If you are not planning surgery, you may be asked to do the following:  Use different eyeglasses.   Use stronger or brighter lighting.   Ask your eye doctor about reducing your medicine dose or changing  medicines if it is thought that a medicine caused your cataract. Changing medicines does not make the cataract go away on its own.   Become familiar with your surroundings. Poor vision can lead to injury. Avoid bumping into things on the affected side. You are at a higher risk for tripping or falling.   Exercise extreme care when driving or operating machinery.   Wear sunglasses if you are sensitive to bright light or experiencing problems with glare.  SEEK IMMEDIATE MEDICAL CARE IF:   You have a worsening or sudden vision loss.   You notice  redness, swelling, or increasing pain in the eye.   You have a fever.  Document Released: 10/09/2005 Document Revised: 09/28/2011 Document Reviewed: 06/02/2011 [facility] Patient Information 2012 [place].PATIENT INSTRUCTIONS POST-ANESTHESIA  IMMEDIATELY FOLLOWING SURGERY:  Do not drive or operate machinery for the first twenty four hours after surgery.  Do not make any important decisions for twenty four hours after surgery or while taking narcotic pain medications or sedatives.  If you develop intractable nausea and vomiting or a severe headache please notify your doctor immediately.  FOLLOW-UP:  Please make an appointment with your surgeon as instructed. You do not need to follow up with anesthesia unless specifically instructed to do so.  WOUND CARE INSTRUCTIONS (if applicable):  Keep a dry clean dressing on the anesthesia/puncture wound site if there is drainage.  Once the wound has quit draining you may leave it open to air.  Generally you should leave the bandage intact for twenty four hours unless there is drainage.  If the epidural site drains for more than 36-48 hours please call the anesthesia department.  QUESTIONS?:  Please feel free to call your physician or the hospital operator if you have any questions, and they will be happy to assist you.

## 2013-09-23 ENCOUNTER — Encounter (HOSPITAL_COMMUNITY): Payer: Self-pay

## 2013-09-23 ENCOUNTER — Other Ambulatory Visit (HOSPITAL_COMMUNITY): Payer: Medicare Other

## 2013-09-23 MED ORDER — CYCLOPENTOLATE-PHENYLEPHRINE 0.2-1 % OP SOLN: 1.0000 [drp] | OPHTHALMIC | Status: DC

## 2013-09-23 MED ORDER — TETRACAINE HCL 0.5 % OP SOLN: 1.0000 [drp] | OPHTHALMIC | Status: DC

## 2013-09-23 MED ORDER — KETOROLAC TROMETHAMINE 0.5 % OP SOLN: 1.0000 [drp] | OPHTHALMIC | Status: DC

## 2013-09-23 MED ORDER — PHENYLEPHRINE HCL 2.5 % OP SOLN: 1.0000 [drp] | OPHTHALMIC | Status: DC

## 2013-09-24 ENCOUNTER — Encounter (HOSPITAL_COMMUNITY)
Admission: RE | Admit: 2013-09-24 | Discharge: 2013-09-24 | Disposition: A | Discharge status: Home / Self Care | Payer: Medicare Other | Source: Ambulatory Visit | Attending: Ophthalmology | Admitting: Ophthalmology

## 2013-09-29 ENCOUNTER — Encounter (HOSPITAL_COMMUNITY): Payer: Self-pay

## 2013-09-30 ENCOUNTER — Ambulatory Visit (HOSPITAL_COMMUNITY): Admission: RE | Admit: 2013-09-30 | Payer: Medicare Other | Source: Ambulatory Visit | Admitting: Ophthalmology

## 2013-09-30 ENCOUNTER — Encounter (HOSPITAL_COMMUNITY): Admission: RE | Payer: Self-pay | Source: Ambulatory Visit

## 2013-09-30 SURGERY — PHACOEMULSIFICATION, CATARACT, WITH IOL INSERTION
Anesthesia: Monitor Anesthesia Care | Laterality: Right

## 2013-10-08 ENCOUNTER — Other Ambulatory Visit (HOSPITAL_COMMUNITY): Payer: Medicare Other

## 2013-10-14 ENCOUNTER — Encounter (HOSPITAL_COMMUNITY): Admission: RE | Payer: Self-pay | Source: Ambulatory Visit

## 2013-10-14 ENCOUNTER — Ambulatory Visit (HOSPITAL_COMMUNITY): Admission: RE | Admit: 2013-10-14 | Payer: Medicare Other | Source: Ambulatory Visit | Admitting: Ophthalmology

## 2013-10-14 SURGERY — PHACOEMULSIFICATION, CATARACT, WITH IOL INSERTION
Anesthesia: Monitor Anesthesia Care | Laterality: Left

## 2013-10-20 ENCOUNTER — Other Ambulatory Visit (HOSPITAL_COMMUNITY): Payer: Self-pay | Admitting: Family Medicine

## 2013-10-20 DIAGNOSIS — Z139 Encounter for screening, unspecified: Secondary | ICD-10-CM

## 2013-11-17 ENCOUNTER — Ambulatory Visit (HOSPITAL_COMMUNITY)
Admission: RE | Admit: 2013-11-17 | Discharge: 2013-11-17 | Disposition: A | Discharge status: Home / Self Care | Payer: Medicare Other | Source: Ambulatory Visit | Attending: Family Medicine | Admitting: Family Medicine

## 2013-11-17 DIAGNOSIS — Z1231 Encounter for screening mammogram for malignant neoplasm of breast: Secondary | ICD-10-CM | POA: Insufficient documentation

## 2013-11-17 DIAGNOSIS — Z139 Encounter for screening, unspecified: Secondary | ICD-10-CM

## 2014-04-07 ENCOUNTER — Encounter (HOSPITAL_COMMUNITY): Payer: Self-pay | Admitting: Pharmacy Technician

## 2014-04-09 NOTE — Patient Instructions (Signed)
Your procedure is scheduled on:  04/14/14  Report to [facility] at 07:30 AM.  Call this number if you have problems the morning of surgery: [phone]   Remember:   Do not eat food or drink liquids after midnight.   Take these medicines the morning of surgery with A SIP OF WATER: Hydrochlorothiazide   Do not wear jewelry, make-up or nail polish.  Do not wear lotions, powders, or perfumes. You may wear deodorant.  Do not bring valuables to the hospital.  [facility] is not responsible for any belongings or valuables.               Contacts, dentures or bridgework may not be worn into surgery.               Patients discharged the day of surgery will not be allowed to drive home.   Special Instructions: Start using your eye drops prior to surgery as directed by your eye doctor.   Please read over the following fact sheets that you were given: Anesthesia Post-op Instructions and Care and Recovery After Surgery     Cataract Surgery  A cataract is a clouding of the lens of the eye. When a lens becomes cloudy, vision is reduced based on the degree and nature of the clouding. Surgery may be needed to improve vision. Surgery removes the cloudy lens and usually replaces it with a substitute lens (intraocular lens, IOL). LET YOUR EYE DOCTOR KNOW ABOUT:  Allergies to food or medicine.  Medicines taken including herbs, eyedrops, over-the-counter medicines, and creams.  Use of steroids (by mouth or creams).  Previous problems with anesthetics or numbing medicine.  History of bleeding problems or blood clots.  Previous surgery.  Other health problems, including diabetes and kidney problems.  Possibility of pregnancy, if this applies. RISKS AND COMPLICATIONS  Infection.  Inflammation of the eyeball (endophthalmitis) that can spread to both eyes (sympathetic ophthalmia).  Poor wound healing.  If an IOL is inserted, it can later fall out of proper position. This is very  uncommon.  Clouding of the part of your eye that holds an IOL in place. This is called an "after-cataract." These are uncommon, but easily treated. BEFORE THE PROCEDURE  Do not eat or drink anything except small amounts of water for 8 to 12 before your surgery, or as directed by your caregiver.  Unless you are told otherwise, continue any eyedrops you have been prescribed.  Talk to your primary caregiver about all other medicines that you take (both prescription and non-prescription). In some cases, you may need to stop or change medicines near the time of your surgery. This is most important if you are taking blood-thinning medicine.Do not stop medicines unless you are told to do so.  Arrange for someone to drive you to and from the procedure.  Do not put contact lenses in either eye on the day of your surgery. PROCEDURE There is more than one method for safely removing a cataract. Your doctor can explain the differences and help determine which is best for you. Phacoemulsification surgery is the most common form of cataract surgery.  An injection is given behind the eye or eyedrops are given to make this a painless procedure.  A small cut (incision) is made on the edge of the clear, dome-shaped surface that covers the front of the eye (cornea).  A tiny probe is painlessly inserted into the eye. This device gives off ultrasound waves that soften and break up the  cloudy center of the lens. This makes it easier for the cloudy lens to be removed by suction.  An IOL may be implanted.  The normal lens of the eye is covered by a clear capsule. Part of that capsule is intentionally left in the eye to support the IOL.  Your surgeon may or may not use stitches to close the incision. There are other forms of cataract surgery that require a larger incision and stiches to close the eye. This approach is taken in cases where the doctor feels that the cataract cannot be easily removed using  phacoemulsification. AFTER THE PROCEDURE  When an IOL is implanted, it does not need care. It becomes a permanent part of your eye and cannot be seen or felt.  Your doctor will schedule follow-up exams to check on your progress.  Review your other medicines with your doctor to see which can be resumed after surgery.  Use eyedrops or take medicine as prescribed by your doctor. Document Released: 09/28/2011 Document Revised: 01/01/2012 Document Reviewed: 09/28/2011 [facility] Patient Information 2013 [place].    PATIENT INSTRUCTIONS POST-ANESTHESIA  IMMEDIATELY FOLLOWING SURGERY:  Do not drive or operate machinery for the first twenty four hours after surgery.  Do not make any important decisions for twenty four hours after surgery or while taking narcotic pain medications or sedatives.  If you develop intractable nausea and vomiting or a severe headache please notify your doctor immediately.  FOLLOW-UP:  Please make an appointment with your surgeon as instructed. You do not need to follow up with anesthesia unless specifically instructed to do so.  WOUND CARE INSTRUCTIONS (if applicable):  Keep a dry clean dressing on the anesthesia/puncture wound site if there is drainage.  Once the wound has quit draining you may leave it open to air.  Generally you should leave the bandage intact for twenty four hours unless there is drainage.  If the epidural site drains for more than 36-48 hours please call the anesthesia department.  QUESTIONS?:  Please feel free to call your physician or the hospital operator if you have any questions, and they will be happy to assist you.

## 2014-04-10 ENCOUNTER — Encounter (HOSPITAL_COMMUNITY): Payer: Self-pay

## 2014-04-10 ENCOUNTER — Encounter (HOSPITAL_COMMUNITY)
Admission: RE | Admit: 2014-04-10 | Discharge: 2014-04-10 | Disposition: A | Discharge status: Home / Self Care | Payer: Medicare Other | Source: Ambulatory Visit | Attending: Ophthalmology | Admitting: Ophthalmology

## 2014-04-10 DIAGNOSIS — Z01812 Encounter for preprocedural laboratory examination: Secondary | ICD-10-CM | POA: Diagnosis present

## 2014-04-10 LAB — BASIC METABOLIC PANEL
BUN: 19 mg/dL (ref 6–23)
CHLORIDE: 99 meq/L (ref 96–112)
CO2: 28 mEq/L (ref 19–32)
Calcium: 9.2 mg/dL (ref 8.4–10.5)
Creatinine, Ser: 0.72 mg/dL (ref 0.50–1.10)
GFR calc non Af Amer: 84 mL/min — ABNORMAL LOW (ref >90)
Glucose, Bld: 92 mg/dL (ref 70–99)
POTASSIUM: 4.3 meq/L (ref 3.7–5.3)
Sodium: 140 mEq/L (ref 137–147)

## 2014-04-10 LAB — CBC WITH DIFFERENTIAL/PLATELET
Basophils Absolute: 0 10*3/uL (ref 0.0–0.1)
Basophils Relative: 0 % (ref 0–1)
Eosinophils Absolute: 0.1 10*3/uL (ref 0.0–0.7)
Eosinophils Relative: 3 % (ref 0–5)
HCT: 37.2 % (ref 36.0–46.0)
Hemoglobin: 12.3 g/dL (ref 12.0–15.0)
Lymphocytes Relative: 45 % (ref 12–46)
Lymphs Abs: 2.2 10*3/uL (ref 0.7–4.0)
MCH: 27.6 pg (ref 26.0–34.0)
MCHC: 33.1 g/dL (ref 30.0–36.0)
MCV: 83.4 fL (ref 78.0–100.0)
Monocytes Absolute: 0.5 10*3/uL (ref 0.1–1.0)
Monocytes Relative: 10 % (ref 3–12)
Neutro Abs: 2 10*3/uL (ref 1.7–7.7)
Neutrophils Relative %: 42 % — ABNORMAL LOW (ref 43–77)
Platelets: 311 10*3/uL (ref 150–400)
RBC: 4.46 MIL/uL (ref 3.87–5.11)
RDW: 13 % (ref 11.5–15.5)
WBC: 4.9 10*3/uL (ref 4.0–10.5)

## 2014-04-10 NOTE — Pre-Procedure Instructions (Signed)
Patient given information to sign up for my chart at home. 

## 2014-04-14 ENCOUNTER — Ambulatory Visit (HOSPITAL_COMMUNITY): Payer: Medicare Other | Admitting: Anesthesiology

## 2014-04-14 ENCOUNTER — Encounter (HOSPITAL_COMMUNITY)
Admission: RE | Disposition: A | Discharge status: Home / Self Care | Payer: Self-pay | Source: Ambulatory Visit | Attending: Ophthalmology

## 2014-04-14 ENCOUNTER — Encounter (HOSPITAL_COMMUNITY): Payer: Self-pay | Admitting: *Deleted

## 2014-04-14 ENCOUNTER — Encounter (HOSPITAL_COMMUNITY): Payer: Medicare Other | Admitting: Anesthesiology

## 2014-04-14 ENCOUNTER — Ambulatory Visit (HOSPITAL_COMMUNITY)
Admission: RE | Admit: 2014-04-14 | Discharge: 2014-04-14 | Disposition: A | Discharge status: Home / Self Care | Payer: Medicare Other | Source: Ambulatory Visit | Attending: Ophthalmology | Admitting: Ophthalmology

## 2014-04-14 DIAGNOSIS — Z79899 Other long term (current) drug therapy: Secondary | ICD-10-CM | POA: Insufficient documentation

## 2014-04-14 DIAGNOSIS — H2510 Age-related nuclear cataract, unspecified eye: Secondary | ICD-10-CM | POA: Insufficient documentation

## 2014-04-14 DIAGNOSIS — I10 Essential (primary) hypertension: Secondary | ICD-10-CM | POA: Insufficient documentation

## 2014-04-14 DIAGNOSIS — F329 Major depressive disorder, single episode, unspecified: Secondary | ICD-10-CM | POA: Insufficient documentation

## 2014-04-14 DIAGNOSIS — F3289 Other specified depressive episodes: Secondary | ICD-10-CM | POA: Insufficient documentation

## 2014-04-14 HISTORY — PX: CATARACT EXTRACTION W/PHACO: SHX586

## 2014-04-14 SURGERY — PHACOEMULSIFICATION, CATARACT, WITH IOL INSERTION
Anesthesia: Monitor Anesthesia Care | Site: Eye | Laterality: Right

## 2014-04-14 MED ORDER — BSS IO SOLN
INTRAOCULAR | Status: DC | PRN
  Administered 2014-04-14: 15 mL via INTRAOCULAR

## 2014-04-14 MED ORDER — CYCLOPENTOLATE-PHENYLEPHRINE OP SOLN OPTIME - NO CHARGE
OPHTHALMIC | Status: AC
  Filled 2014-04-14: qty 2

## 2014-04-14 MED ORDER — CYCLOPENTOLATE-PHENYLEPHRINE 0.2-1 % OP SOLN
1.0000 [drp] | OPHTHALMIC | Status: AC
  Administered 2014-04-14 (×3): 1 [drp] via OPHTHALMIC

## 2014-04-14 MED ORDER — TETRACAINE HCL 0.5 % OP SOLN
1.0000 [drp] | OPHTHALMIC | Status: AC
  Administered 2014-04-14 (×3): 1 [drp] via OPHTHALMIC

## 2014-04-14 MED ORDER — TETRACAINE HCL 0.5 % OP SOLN
OPHTHALMIC | Status: AC
Start: 2014-04-14 — End: 2014-04-14
  Filled 2014-04-14: qty 2

## 2014-04-14 MED ORDER — FENTANYL CITRATE 0.05 MG/ML IJ SOLN
25.0000 ug | INTRAMUSCULAR | Status: AC
  Administered 2014-04-14 (×2): 25 ug via INTRAVENOUS

## 2014-04-14 MED ORDER — PROVISC 10 MG/ML IO SOLN
INTRAOCULAR | Status: DC | PRN
  Administered 2014-04-14: 0.85 mL via INTRAOCULAR

## 2014-04-14 MED ORDER — MIDAZOLAM HCL 2 MG/2ML IJ SOLN
1.0000 mg | INTRAMUSCULAR | Status: DC | PRN
  Administered 2014-04-14: 2 mg via INTRAVENOUS

## 2014-04-14 MED ORDER — KETOROLAC TROMETHAMINE 0.5 % OP SOLN
OPHTHALMIC | Status: AC
  Filled 2014-04-14: qty 5

## 2014-04-14 MED ORDER — KETOROLAC TROMETHAMINE 0.5 % OP SOLN
1.0000 [drp] | OPHTHALMIC | Status: AC
  Administered 2014-04-14 (×3): 1 [drp] via OPHTHALMIC

## 2014-04-14 MED ORDER — PHENYLEPHRINE HCL 2.5 % OP SOLN
1.0000 [drp] | OPHTHALMIC | Status: AC
  Administered 2014-04-14 (×3): 1 [drp] via OPHTHALMIC

## 2014-04-14 MED ORDER — MIDAZOLAM HCL 2 MG/2ML IJ SOLN
INTRAMUSCULAR | Status: AC
  Filled 2014-04-14: qty 2

## 2014-04-14 MED ORDER — PHENYLEPHRINE HCL 2.5 % OP SOLN
OPHTHALMIC | Status: AC
  Filled 2014-04-14: qty 15

## 2014-04-14 MED ORDER — TETRACAINE 0.5 % OP SOLN OPTIME - NO CHARGE
OPHTHALMIC | Status: DC | PRN
  Administered 2014-04-14: 2 [drp] via OPHTHALMIC

## 2014-04-14 MED ORDER — EPINEPHRINE HCL 1 MG/ML IJ SOLN
INTRAOCULAR | Status: DC | PRN
  Administered 2014-04-14: 09:00:00

## 2014-04-14 MED ORDER — FENTANYL CITRATE 0.05 MG/ML IJ SOLN
INTRAMUSCULAR | Status: AC
  Filled 2014-04-14: qty 2

## 2014-04-14 MED ORDER — LACTATED RINGERS IV SOLN
INTRAVENOUS | Status: DC
  Administered 2014-04-14: 1000 mL via INTRAVENOUS

## 2014-04-14 MED ORDER — EPINEPHRINE HCL 1 MG/ML IJ SOLN
INTRAMUSCULAR | Status: AC
  Filled 2014-04-14: qty 1

## 2014-04-14 SURGICAL SUPPLY — 25 items
CAPSULAR TENSION RING-AMO (OPHTHALMIC RELATED) IMPLANT
CLOTH BEACON ORANGE TIMEOUT ST (SAFETY) ×1 IMPLANT
EYE SHIELD UNIVERSAL CLEAR (GAUZE/BANDAGES/DRESSINGS) ×1 IMPLANT
GLOVE BIO SURGEON STRL SZ 6.5 (GLOVE) IMPLANT
GLOVE BIOGEL PI IND STRL 6.5 (GLOVE) IMPLANT
GLOVE BIOGEL PI INDICATOR 6.5 (GLOVE) ×1
GLOVE ECLIPSE 6.5 STRL STRAW (GLOVE) IMPLANT
GLOVE ECLIPSE 7.0 STRL STRAW (GLOVE) IMPLANT
GLOVE EXAM NITRILE LRG STRL (GLOVE) IMPLANT
GLOVE EXAM NITRILE MD LF STRL (GLOVE) IMPLANT
GLOVE SKINSENSE NS SZ6.5 (GLOVE)
GLOVE SKINSENSE STRL SZ6.5 (GLOVE) IMPLANT
HEALON 5 0.6 ML (INTRAOCULAR LENS) IMPLANT
KIT VITRECTOMY (OPHTHALMIC RELATED) IMPLANT
PAD ARMBOARD 7.5X6 YLW CONV (MISCELLANEOUS) ×1 IMPLANT
PROC W NO LENS (INTRAOCULAR LENS)
PROC W SPEC LENS (INTRAOCULAR LENS)
PROCESS W NO LENS (INTRAOCULAR LENS) IMPLANT
PROCESS W SPEC LENS (INTRAOCULAR LENS) IMPLANT
RING MALYGIN (MISCELLANEOUS) IMPLANT
SIGHTPATH CAT PROC W REG LENS (Ophthalmic Related) ×2 IMPLANT
TAPE SURG TRANSPORE 1 IN (GAUZE/BANDAGES/DRESSINGS) IMPLANT
TAPE SURGICAL TRANSPORE 1 IN (GAUZE/BANDAGES/DRESSINGS) ×1
VISCOELASTIC ADDITIONAL (OPHTHALMIC RELATED) IMPLANT
WATER STERILE IRR 250ML POUR (IV SOLUTION) ×1 IMPLANT

## 2014-04-14 NOTE — H&P (Signed)
The patient was re examined and there is no change in the patients condition since the original H and P. 

## 2014-04-14 NOTE — Anesthesia Postprocedure Evaluation (Signed)
  Anesthesia Post-op Note  Patient: [name]  Procedure(s) Performed: Procedure(s): CATARACT EXTRACTION PHACO AND INTRAOCULAR LENS PLACEMENT (IOC) CDE=6.22 (Right)  Patient Location: Short Stay  Anesthesia Type:MAC  Level of Consciousness: awake, alert , oriented and patient cooperative  Airway and Oxygen Therapy: Patient Spontanous Breathing  Post-op Pain: none  Post-op Assessment: Post-op Vital signs reviewed and Patient's Cardiovascular Status Stable  Post-op Vital Signs: Reviewed and stable  Last Vitals: There were no vitals filed for this visit.  Complications: No apparent anesthesia complications

## 2014-04-14 NOTE — Transfer of Care (Signed)
Immediate Anesthesia Transfer of Care Note  Patient: [name]  Procedure(s) Performed: Procedure(s): CATARACT EXTRACTION PHACO AND INTRAOCULAR LENS PLACEMENT (IOC) CDE=6.22 (Right)  Patient Location: Short Stay  Anesthesia Type:MAC  Level of Consciousness: awake, alert , oriented and patient cooperative  Airway & Oxygen Therapy: Patient Spontanous Breathing  Post-op Assessment: Report given to PACU RN, Post -op Vital signs reviewed and stable and Patient moving all extremities  Post vital signs: Reviewed and stable  Complications: No apparent anesthesia complications

## 2014-04-14 NOTE — Op Note (Signed)
Patient brought to the operating room and prepped and draped in the usual manner.  Lid speculum inserted in right eye.  Stab incision made at the twelve o'clock position.  Provisc instilled in the anterior chamber.   A 2.4 mm. Stab incision was made temporally.  An anterior capsulotomy was done with a bent 25 gauge needle.  The nucleus was hydrodissected.  The Phaco tip was inserted in the anterior chamber and the nucleus was emulsified.  CDE was 6.22.  The cortical material was then removed with the I and A tip.  Posterior capsule was the polished.  The anterior chamber was deepened with Provisc.  A 22.0 [place] was then inserted in the capsular bag.  Provisc was then removed with the I and A tip.  The wound was then hydrated.  Patient sent to the Recovery Room in good condition with follow up in my office.  Preoperative Diagnosis:  Nuclear Cataract OD Postoperative Diagnosis:  Same Procedure name: Kelman Phacoemulsification OD with IOL

## 2014-04-14 NOTE — Discharge Instructions (Signed)
Cataract Surgery °Care After °Refer to this sheet in the next few weeks. These instructions provide you with information on caring for yourself after your procedure. Your caregiver may also give you more specific instructions. Your treatment has been planned according to current medical practices, but problems sometimes occur. Call your caregiver if you have any problems or questions after your procedure.  °HOME CARE INSTRUCTIONS  °· Avoid strenuous activities as directed by your caregiver. °· Ask your caregiver when you can resume driving. °· Use eyedrops or other medicines to help healing and control pressure inside your eye as directed by your caregiver. °· Only take over-the-counter or prescription medicines for pain, discomfort, or fever as directed by your caregiver. °· Do not to touch or rub your eyes. °· You may be instructed to use a protective shield during the first few days and nights after surgery. If not, wear sunglasses to protect your eyes. This is to protect the eye from pressure or from being accidentally bumped. °· Keep the area around your eye clean and dry. Avoid swimming or allowing water to hit you directly in the face while showering. Keep soap and shampoo out of your eyes. °· Do not bend or lift heavy objects. Bending increases pressure in the eye. You can walk, climb stairs, and do light household chores. °· Do not put a contact lens into the eye that had surgery until your caregiver says it is okay to do so. °· Ask your doctor when you can return to work. This will depend on the kind of work that you do. If you work in a dusty environment, you may be advised to wear protective eyewear for a period of time. °· Ask your caregiver when it will be safe to engage in sexual activity. °· Continue with your regular eye exams as directed by your caregiver. °What to expect: °· It is normal to feel itching and mild discomfort for a few days after cataract surgery. Some fluid discharge is also common,  and your eye may be sensitive to light and touch. °· After 1 to 2 days, even moderate discomfort should disappear. In most cases, healing will take about 6 weeks. °· If you received an intraocular lens (IOL), you may notice that colors are very bright or have a blue tinge. Also, if you have been in bright sunlight, everything may appear reddish for a few hours. If you see these color tinges, it is because your lens is clear and no longer cloudy. Within a few months after receiving an IOL, these extra colors should go away. When you have healed, you will probably need new glasses. °SEEK MEDICAL CARE IF:  °· You have increased bruising around your eye. °· You have discomfort not helped by medicine. °SEEK IMMEDIATE MEDICAL CARE IF:  °· You have a  fever. °· You have a worsening or sudden vision loss. °· You have redness, swelling, or increasing pain in the eye. °· You have a thick discharge from the eye that had surgery. °MAKE SURE YOU: °· Understand these instructions. °· Will watch your condition. °· Will get help right away if you are not doing well or get worse. °Document Released: 04/28/2005 Document Revised: 01/01/2012 Document Reviewed: 06/02/2011 °[employer]® Patient Information ©2015 [employer]. This information is not intended to replace advice given to you by your health care provider. Make sure you discuss any questions you have with your health care provider. ° °

## 2014-04-14 NOTE — Anesthesia Preprocedure Evaluation (Addendum)
Anesthesia Evaluation  Patient identified by MRN, date of birth, ID band Patient awake    Reviewed: Allergy & Precautions, H&P , NPO status , Patient's Chart, lab work & pertinent test results  Airway Mallampati: II TM Distance: >3 FB     Dental  (+) Teeth Intact   Pulmonary neg pulmonary ROS,  breath sounds clear to auscultation        Cardiovascular hypertension, Pt. on medications Rhythm:Regular Rate:Normal     Neuro/Psych PSYCHIATRIC DISORDERS Depression    GI/Hepatic negative GI ROS,   Endo/Other    Renal/GU      Musculoskeletal   Abdominal   Peds  Hematology   Anesthesia Other Findings   Reproductive/Obstetrics                         Anesthesia Physical Anesthesia Plan  ASA: III  Anesthesia Plan: MAC   Post-op Pain Management:    Induction: Intravenous  Airway Management Planned: Nasal Cannula  Additional Equipment:   Intra-op Plan:   Post-operative Plan:   Informed Consent: I have reviewed the patients History and Physical, chart, labs and discussed the procedure including the risks, benefits and alternatives for the proposed anesthesia with the patient or authorized representative who has indicated his/her understanding and acceptance.     Plan Discussed with:   Anesthesia Plan Comments:        Anesthesia Quick Evaluation

## 2014-04-15 ENCOUNTER — Encounter (HOSPITAL_COMMUNITY): Payer: Self-pay | Admitting: Ophthalmology

## 2014-04-20 ENCOUNTER — Encounter (HOSPITAL_COMMUNITY): Payer: Self-pay | Admitting: Pharmacy Technician

## 2014-04-29 MED ORDER — ONDANSETRON HCL 4 MG/2ML IJ SOLN: 4.0000 mg | Freq: Once | INTRAMUSCULAR | Status: AC | PRN

## 2014-04-29 MED ORDER — FENTANYL CITRATE 0.05 MG/ML IJ SOLN: 25.0000 ug | INTRAMUSCULAR | Status: DC | PRN

## 2014-04-30 ENCOUNTER — Encounter (HOSPITAL_COMMUNITY)
Admission: RE | Admit: 2014-04-30 | Discharge: 2014-04-30 | Disposition: A | Discharge status: Home / Self Care | Payer: Medicare Other | Source: Ambulatory Visit | Attending: Ophthalmology | Admitting: Ophthalmology

## 2014-05-05 ENCOUNTER — Ambulatory Visit (HOSPITAL_COMMUNITY): Admission: RE | Admit: 2014-05-05 | Payer: Medicare Other | Source: Ambulatory Visit | Admitting: Ophthalmology

## 2014-05-05 ENCOUNTER — Encounter (HOSPITAL_COMMUNITY): Admission: RE | Payer: Self-pay | Source: Ambulatory Visit

## 2014-05-05 SURGERY — PHACOEMULSIFICATION, CATARACT, WITH IOL INSERTION
Anesthesia: Monitor Anesthesia Care | Laterality: Left

## 2014-06-05 ENCOUNTER — Encounter (HOSPITAL_COMMUNITY): Payer: Self-pay | Admitting: Pharmacy Technician

## 2014-06-09 ENCOUNTER — Encounter (HOSPITAL_COMMUNITY)
Admission: RE | Admit: 2014-06-09 | Discharge: 2014-06-09 | Disposition: A | Discharge status: Home / Self Care | Payer: Medicare Other | Source: Ambulatory Visit | Attending: Ophthalmology | Admitting: Ophthalmology

## 2014-06-09 NOTE — Patient Instructions (Signed)
Your procedure is scheduled on: 06/16/2014  Report to [facility] at  7:00     AM.  Call this number if you have problems the morning of surgery: [phone]   Do not eat food or drink liquids :After Midnight.      Take these medicines the morning of surgery with A SIP OF WATER: Zyrtec and Lisinopril   Do not wear jewelry, make-up or nail polish.  Do not wear lotions, powders, or perfumes. You may wear deodorant.  Do not shave 48 hours prior to surgery.  Do not bring valuables to the hospital.  Contacts, dentures or bridgework may not be worn into surgery.  Leave suitcase in the car. After surgery it may be brought to your room.  For patients admitted to the hospital, checkout time is 11:00 AM the day of discharge.   Patients discharged the day of surgery will not be allowed to drive home.  :     Please read over the following fact sheets that you were given: Coughing and Deep Breathing, Surgical Site Infection Prevention, Anesthesia Post-op Instructions and Care and Recovery After Surgery    Cataract A cataract is a clouding of the lens of the eye. When a lens becomes cloudy, vision is reduced based on the degree and nature of the clouding. Many cataracts reduce vision to some degree. Some cataracts make people more near-sighted as they develop. Other cataracts increase glare. Cataracts that are ignored and become worse can sometimes look white. The white color can be seen through the pupil. CAUSES   Aging. However, cataracts may occur at any age, even in newborns.   Certain drugs.   Trauma to the eye.   Certain diseases such as diabetes.   Specific eye diseases such as chronic inflammation inside the eye or a sudden attack of a rare form of glaucoma.   Inherited or acquired medical problems.  SYMPTOMS   Gradual, progressive drop in vision in the affected eye.   Severe, rapid visual loss. This most often happens when trauma is the cause.  DIAGNOSIS  To detect a cataract, an  eye doctor examines the lens. Cataracts are best diagnosed with an exam of the eyes with the pupils enlarged (dilated) by drops.  TREATMENT  For an early cataract, vision may improve by using different eyeglasses or stronger lighting. If that does not help your vision, surgery is the only effective treatment. A cataract needs to be surgically removed when vision loss interferes with your everyday activities, such as driving, reading, or watching TV. A cataract may also have to be removed if it prevents examination or treatment of another eye problem. Surgery removes the cloudy lens and usually replaces it with a substitute lens (intraocular lens, IOL).  At a time when both you and your doctor agree, the cataract will be surgically removed. If you have cataracts in both eyes, only one is usually removed at a time. This allows the operated eye to heal and be out of danger from any possible problems after surgery (such as infection or poor wound healing). In rare cases, a cataract may be doing damage to your eye. In these cases, your caregiver may advise surgical removal right away. The vast majority of people who have cataract surgery have better vision afterward. HOME CARE INSTRUCTIONS  If you are not planning surgery, you may be asked to do the following:  Use different eyeglasses.   Use stronger or brighter lighting.   Ask your eye doctor about  reducing your medicine dose or changing medicines if it is thought that a medicine caused your cataract. Changing medicines does not make the cataract go away on its own.   Become familiar with your surroundings. Poor vision can lead to injury. Avoid bumping into things on the affected side. You are at a higher risk for tripping or falling.   Exercise extreme care when driving or operating machinery.   Wear sunglasses if you are sensitive to bright light or experiencing problems with glare.  SEEK IMMEDIATE MEDICAL CARE IF:   You have a worsening or sudden  vision loss.   You notice redness, swelling, or increasing pain in the eye.   You have a fever.  Document Released: 10/09/2005 Document Revised: 09/28/2011 Document Reviewed: 06/02/2011 [facility] Patient Information 2012 [place]. PATIENT INSTRUCTIONS POST-ANESTHESIA  IMMEDIATELY FOLLOWING SURGERY:  Do not drive or operate machinery for the first twenty four hours after surgery.  Do not make any important decisions for twenty four hours after surgery or while taking narcotic pain medications or sedatives.  If you develop intractable nausea and vomiting or a severe headache please notify your doctor immediately.  FOLLOW-UP:  Please make an appointment with your surgeon as instructed. You do not need to follow up with anesthesia unless specifically instructed to do so.  WOUND CARE INSTRUCTIONS (if applicable):  Keep a dry clean dressing on the anesthesia/puncture wound site if there is drainage.  Once the wound has quit draining you may leave it open to air.  Generally you should leave the bandage intact for twenty four hours unless there is drainage.  If the epidural site drains for more than 36-48 hours please call the anesthesia department.  QUESTIONS?:  Please feel free to call your physician or the hospital operator if you have any questions, and they will be happy to assist you.

## 2014-06-15 MED ORDER — TETRACAINE HCL 0.5 % OP SOLN
OPHTHALMIC | Status: AC
  Filled 2014-06-15: qty 2

## 2014-06-15 MED ORDER — PHENYLEPHRINE HCL 2.5 % OP SOLN
OPHTHALMIC | Status: AC
  Filled 2014-06-15: qty 15

## 2014-06-15 MED ORDER — KETOROLAC TROMETHAMINE 0.5 % OP SOLN
OPHTHALMIC | Status: AC
  Filled 2014-06-15: qty 5

## 2014-06-15 MED ORDER — CYCLOPENTOLATE-PHENYLEPHRINE OP SOLN OPTIME - NO CHARGE
OPHTHALMIC | Status: AC
  Filled 2014-06-15: qty 2

## 2014-06-16 ENCOUNTER — Ambulatory Visit (HOSPITAL_COMMUNITY): Payer: Medicare Other | Admitting: Anesthesiology

## 2014-06-16 ENCOUNTER — Encounter (HOSPITAL_COMMUNITY)
Admission: RE | Disposition: A | Discharge status: Home / Self Care | Payer: Self-pay | Source: Ambulatory Visit | Attending: Ophthalmology

## 2014-06-16 ENCOUNTER — Ambulatory Visit (HOSPITAL_COMMUNITY)
Admission: RE | Admit: 2014-06-16 | Discharge: 2014-06-16 | Disposition: A | Discharge status: Home / Self Care | Payer: Medicare Other | Source: Ambulatory Visit | Attending: Ophthalmology | Admitting: Ophthalmology

## 2014-06-16 ENCOUNTER — Encounter (HOSPITAL_COMMUNITY): Payer: Medicare Other | Admitting: Anesthesiology

## 2014-06-16 DIAGNOSIS — F3289 Other specified depressive episodes: Secondary | ICD-10-CM | POA: Insufficient documentation

## 2014-06-16 DIAGNOSIS — Z79899 Other long term (current) drug therapy: Secondary | ICD-10-CM | POA: Insufficient documentation

## 2014-06-16 DIAGNOSIS — H2510 Age-related nuclear cataract, unspecified eye: Secondary | ICD-10-CM | POA: Insufficient documentation

## 2014-06-16 DIAGNOSIS — I10 Essential (primary) hypertension: Secondary | ICD-10-CM | POA: Diagnosis not present

## 2014-06-16 DIAGNOSIS — F329 Major depressive disorder, single episode, unspecified: Secondary | ICD-10-CM | POA: Diagnosis not present

## 2014-06-16 HISTORY — PX: CATARACT EXTRACTION W/PHACO: SHX586

## 2014-06-16 SURGERY — PHACOEMULSIFICATION, CATARACT, WITH IOL INSERTION
Anesthesia: Monitor Anesthesia Care | Site: Eye | Laterality: Left

## 2014-06-16 MED ORDER — KETOROLAC TROMETHAMINE 0.5 % OP SOLN
1.0000 [drp] | OPHTHALMIC | Status: AC
  Administered 2014-06-16 (×3): 1 [drp] via OPHTHALMIC

## 2014-06-16 MED ORDER — EPINEPHRINE HCL 1 MG/ML IJ SOLN
INTRAOCULAR | Status: DC | PRN
  Administered 2014-06-16: 08:00:00

## 2014-06-16 MED ORDER — PHENYLEPHRINE HCL 2.5 % OP SOLN
1.0000 [drp] | OPHTHALMIC | Status: AC
  Administered 2014-06-16 (×3): 1 [drp] via OPHTHALMIC

## 2014-06-16 MED ORDER — MIDAZOLAM HCL 2 MG/2ML IJ SOLN
1.0000 mg | INTRAMUSCULAR | Status: DC | PRN
  Administered 2014-06-16 (×2): 2 mg via INTRAVENOUS
  Filled 2014-06-16 (×2): qty 2

## 2014-06-16 MED ORDER — TETRACAINE 0.5 % OP SOLN OPTIME - NO CHARGE
OPHTHALMIC | Status: DC | PRN
  Administered 2014-06-16: 2 [drp] via OPHTHALMIC

## 2014-06-16 MED ORDER — LACTATED RINGERS IV SOLN
INTRAVENOUS | Status: DC
  Administered 2014-06-16: 1000 mL via INTRAVENOUS

## 2014-06-16 MED ORDER — CYCLOPENTOLATE-PHENYLEPHRINE 0.2-1 % OP SOLN
1.0000 [drp] | OPHTHALMIC | Status: AC
  Administered 2014-06-16 (×3): 1 [drp] via OPHTHALMIC

## 2014-06-16 MED ORDER — TETRACAINE HCL 0.5 % OP SOLN
1.0000 [drp] | OPHTHALMIC | Status: AC
  Administered 2014-06-16 (×3): 1 [drp] via OPHTHALMIC

## 2014-06-16 MED ORDER — PROVISC 10 MG/ML IO SOLN
INTRAOCULAR | Status: DC | PRN
Start: 2014-06-16 — End: 2014-06-16
  Administered 2014-06-16: 0.85 mL via INTRAOCULAR

## 2014-06-16 MED ORDER — FENTANYL CITRATE 0.05 MG/ML IJ SOLN
25.0000 ug | INTRAMUSCULAR | Status: AC
  Administered 2014-06-16 (×2): 25 ug via INTRAVENOUS
  Filled 2014-06-16: qty 2

## 2014-06-16 MED ORDER — EPINEPHRINE HCL 1 MG/ML IJ SOLN
INTRAMUSCULAR | Status: AC
  Filled 2014-06-16: qty 1

## 2014-06-16 MED ORDER — BSS IO SOLN
INTRAOCULAR | Status: DC | PRN
  Administered 2014-06-16: 15 mL via INTRAOCULAR

## 2014-06-16 SURGICAL SUPPLY — 24 items
CAPSULAR TENSION RING-AMO (OPHTHALMIC RELATED) IMPLANT
CLOTH BEACON ORANGE TIMEOUT ST (SAFETY) ×1 IMPLANT
EYE SHIELD UNIVERSAL CLEAR (GAUZE/BANDAGES/DRESSINGS) ×1 IMPLANT
GLOVE BIO SURGEON STRL SZ 6.5 (GLOVE) ×1 IMPLANT
GLOVE ECLIPSE 6.5 STRL STRAW (GLOVE) IMPLANT
GLOVE ECLIPSE 7.0 STRL STRAW (GLOVE) IMPLANT
GLOVE EXAM NITRILE LRG STRL (GLOVE) IMPLANT
GLOVE EXAM NITRILE MD LF STRL (GLOVE) ×1 IMPLANT
GLOVE SKINSENSE NS SZ6.5 (GLOVE)
GLOVE SKINSENSE STRL SZ6.5 (GLOVE) IMPLANT
HEALON 5 0.6 ML (INTRAOCULAR LENS) IMPLANT
KIT VITRECTOMY (OPHTHALMIC RELATED) IMPLANT
PAD ARMBOARD 7.5X6 YLW CONV (MISCELLANEOUS) ×1 IMPLANT
PROC W NO LENS (INTRAOCULAR LENS)
PROC W SPEC LENS (INTRAOCULAR LENS)
PROCESS W NO LENS (INTRAOCULAR LENS) IMPLANT
PROCESS W SPEC LENS (INTRAOCULAR LENS) IMPLANT
RETRACTOR IRIS SIGHTPATH (OPHTHALMIC RELATED) ×1 IMPLANT
RING MALYGIN (MISCELLANEOUS) IMPLANT
SIGHTPATH CAT PROC W REG LENS (Ophthalmic Related) ×2 IMPLANT
TAPE SURG TRANSPORE 1 IN (GAUZE/BANDAGES/DRESSINGS) IMPLANT
TAPE SURGICAL TRANSPORE 1 IN (GAUZE/BANDAGES/DRESSINGS) ×1
VISCOELASTIC ADDITIONAL (OPHTHALMIC RELATED) IMPLANT
WATER STERILE IRR 250ML POUR (IV SOLUTION) ×1 IMPLANT

## 2014-06-16 NOTE — Anesthesia Preprocedure Evaluation (Signed)
Anesthesia Evaluation  Patient identified by MRN, date of birth, ID band Patient awake    Reviewed: Allergy & Precautions, H&P , NPO status , Patient's Chart, lab work & pertinent test results  Airway Mallampati: II TM Distance: >3 FB     Dental  (+) Teeth Intact   Pulmonary neg pulmonary ROS,  breath sounds clear to auscultation        Cardiovascular hypertension, Pt. on medications Rhythm:Regular Rate:Normal     Neuro/Psych PSYCHIATRIC DISORDERS Depression    GI/Hepatic negative GI ROS,   Endo/Other    Renal/GU      Musculoskeletal   Abdominal   Peds  Hematology   Anesthesia Other Findings   Reproductive/Obstetrics                           Anesthesia Physical Anesthesia Plan  ASA: III  Anesthesia Plan: MAC   Post-op Pain Management:    Induction: Intravenous  Airway Management Planned: Nasal Cannula  Additional Equipment:   Intra-op Plan:   Post-operative Plan:   Informed Consent: I have reviewed the patients History and Physical, chart, labs and discussed the procedure including the risks, benefits and alternatives for the proposed anesthesia with the patient or authorized representative who has indicated his/her understanding and acceptance.     Plan Discussed with:   Anesthesia Plan Comments:         Anesthesia Quick Evaluation

## 2014-06-16 NOTE — Anesthesia Procedure Notes (Signed)
Procedure Name: MAC Date/Time: 06/16/2014 8:19 AM Performed by: Vista Deck Pre-anesthesia Checklist: Patient identified, Emergency Drugs available, Suction available, Timeout performed and Patient being monitored Patient Re-evaluated:Patient Re-evaluated prior to inductionOxygen Delivery Method: Nasal Cannula

## 2014-06-16 NOTE — Anesthesia Postprocedure Evaluation (Signed)
  Anesthesia Post-op Note  Patient: [name]  Procedure(s) Performed: Procedure(s) (LRB): CATARACT EXTRACTION PHACO AND INTRAOCULAR LENS PLACEMENT (IOC) (Left)  Patient Location:  Short Stay  Anesthesia Type: MAC  Level of Consciousness: awake  Airway and Oxygen Therapy: Patient Spontanous Breathing  Post-op Pain: none  Post-op Assessment: Post-op Vital signs reviewed, Patient's Cardiovascular Status Stable, Respiratory Function Stable, Patent Airway, No signs of Nausea or vomiting and Pain level controlled  Post-op Vital Signs: Reviewed and stable  Complications: No apparent anesthesia complications

## 2014-06-16 NOTE — H&P (Signed)
The patient was re examined and there is no change in the patients condition since the original H and P. 

## 2014-06-16 NOTE — Discharge Instructions (Signed)
NAYANA LENIG  06/16/2014           [facility] Instructions [place] [address]      1. Avoid closing eyes tightly. One often closes the eye tightly when laughing, talking, sneezing, coughing or if they feel irritated. At these times, you should be careful not to close your eyes tightly.  2. Instill eye drops as instructed. To instill drops in your eye, open it, look up and have someone gently pull the lower lid down and instill a couple of drops inside the lower lid.  3. Do not touch upper lid.  4. Take Advil or Tylenol for pain.  5. You may use either eye for near work, such as reading or sewing and you may watch television.  6. You may have your hair done at the beauty parlor at any time.  7. Wear dark glasses with or without your own glasses if you are in bright light.  8. Call our office at [phone] or [phone] if you have sharp pain in your eye or unusual symptoms.  9. Do not be concerned because vision in the operative eye is not good. It will not be good, no matter how successful the operation, until you get a special lens for it. Your old glasses will not be suited to the new eye that was operated on and you will not be ready for a new lens for about a month.  10. Follow up at the [facility] office.    I have received a copy of the above instructions and will follow them.   PATIENT INSTRUCTIONS POST-ANESTHESIA  IMMEDIATELY FOLLOWING SURGERY:  Do not drive or operate machinery for the first twenty four hours after surgery.  Do not make any important decisions for twenty four hours after surgery or while taking narcotic pain medications or sedatives.  If you develop intractable nausea and vomiting or a severe headache please notify your doctor immediately.  FOLLOW-UP:  Please make an appointment with your surgeon as instructed. You do not need to follow up with anesthesia unless specifically instructed to do  so.  WOUND CARE INSTRUCTIONS (if applicable):  Keep a dry clean dressing on the anesthesia/puncture wound site if there is drainage.  Once the wound has quit draining you may leave it open to air.  Generally you should leave the bandage intact for twenty four hours unless there is drainage.  If the epidural site drains for more than 36-48 hours please call the anesthesia department.  QUESTIONS?:  Please feel free to call your physician or the hospital operator if you have any questions, and they will be happy to assist you.

## 2014-06-16 NOTE — Transfer of Care (Signed)
Immediate Anesthesia Transfer of Care Note  Patient: [name]  Procedure(s) Performed: Procedure(s) (LRB): CATARACT EXTRACTION PHACO AND INTRAOCULAR LENS PLACEMENT (IOC) (Left)  Patient Location: Shortstay  Anesthesia Type: MAC  Level of Consciousness: awake  Airway & Oxygen Therapy: Patient Spontanous Breathing   Post-op Assessment: Report given to PACU RN, Post -op Vital signs reviewed and stable and Patient moving all extremities  Post vital signs: Reviewed and stable  Complications: No apparent anesthesia complications

## 2014-06-16 NOTE — Op Note (Signed)
Patient brought to the operating room and prepped and draped in the usual manner.  Lid speculum inserted in left eye.  Stab incision made at the twelve o'clock position.  Provisc instilled in the anterior chamber.   A 2.4 mm. Stab incision was made temporally.  An anterior capsulotomy was done with a bent 25 gauge needle.  The nucleus was hydrodissected.  The Phaco tip was inserted in the anterior chamber and the nucleus was emulsified.  CDE was 8.01.  The cortical material was then removed with the I and A tip.  Posterior capsule was the polished.  The anterior chamber was deepened with Provisc.  A 22.0 Benson was then inserted in the capsular bag.  Provisc was then removed with the I and A tip.  The wound was then hydrated.  Patient sent to the Recovery Room in good condition with follow up in my office.  Preoperative Diagnosis:  Nuclear Cataract OS Postoperative Diagnosis:  Same Procedure name: Kelman Phacoemulsification OS with IOL

## 2014-06-17 ENCOUNTER — Encounter (HOSPITAL_COMMUNITY): Payer: Self-pay | Admitting: Ophthalmology

## 2014-08-24 ENCOUNTER — Encounter (HOSPITAL_COMMUNITY): Payer: Self-pay | Admitting: Ophthalmology

## 2014-10-20 ENCOUNTER — Other Ambulatory Visit (HOSPITAL_COMMUNITY): Payer: Self-pay | Admitting: Family Medicine

## 2014-10-20 DIAGNOSIS — Z1231 Encounter for screening mammogram for malignant neoplasm of breast: Secondary | ICD-10-CM

## 2014-11-20 ENCOUNTER — Ambulatory Visit (HOSPITAL_COMMUNITY)
Admission: RE | Admit: 2014-11-20 | Discharge: 2014-11-20 | Disposition: A | Discharge status: Home / Self Care | Payer: Medicare Other | Source: Ambulatory Visit | Attending: Family Medicine | Admitting: Family Medicine

## 2014-11-20 DIAGNOSIS — Z1231 Encounter for screening mammogram for malignant neoplasm of breast: Secondary | ICD-10-CM

## 2015-10-19 ENCOUNTER — Other Ambulatory Visit (HOSPITAL_COMMUNITY): Payer: Self-pay | Admitting: Family Medicine

## 2015-10-19 DIAGNOSIS — Z1231 Encounter for screening mammogram for malignant neoplasm of breast: Secondary | ICD-10-CM

## 2015-11-22 ENCOUNTER — Ambulatory Visit (HOSPITAL_COMMUNITY): Payer: Medicare Other

## 2015-11-22 ENCOUNTER — Ambulatory Visit (HOSPITAL_COMMUNITY)
Admission: RE | Admit: 2015-11-22 | Discharge: 2015-11-22 | Disposition: A | Discharge status: Home / Self Care | Payer: Medicare Other | Source: Ambulatory Visit | Attending: Family Medicine | Admitting: Family Medicine

## 2015-11-22 DIAGNOSIS — Z1231 Encounter for screening mammogram for malignant neoplasm of breast: Secondary | ICD-10-CM | POA: Insufficient documentation

## 2016-02-15 ENCOUNTER — Other Ambulatory Visit (HOSPITAL_COMMUNITY): Payer: Self-pay | Admitting: Family

## 2016-02-15 DIAGNOSIS — Z780 Asymptomatic menopausal state: Secondary | ICD-10-CM

## 2016-02-21 ENCOUNTER — Inpatient Hospital Stay (HOSPITAL_COMMUNITY): Admission: RE | Admit: 2016-02-21 | Payer: Medicare Other | Source: Ambulatory Visit

## 2016-03-14 ENCOUNTER — Other Ambulatory Visit (HOSPITAL_COMMUNITY): Payer: Medicare Other

## 2016-04-17 ENCOUNTER — Ambulatory Visit (HOSPITAL_COMMUNITY)
Admission: RE | Admit: 2016-04-17 | Discharge: 2016-04-17 | Disposition: A | Discharge status: Home / Self Care | Payer: Medicare Other | Source: Ambulatory Visit | Attending: Family | Admitting: Family

## 2016-04-17 DIAGNOSIS — Z13820 Encounter for screening for osteoporosis: Secondary | ICD-10-CM | POA: Insufficient documentation

## 2016-04-17 DIAGNOSIS — Z780 Asymptomatic menopausal state: Secondary | ICD-10-CM

## 2016-10-27 ENCOUNTER — Emergency Department (HOSPITAL_COMMUNITY): Payer: Medicare Other

## 2016-10-27 ENCOUNTER — Encounter (HOSPITAL_COMMUNITY): Payer: Self-pay | Admitting: *Deleted

## 2016-10-27 ENCOUNTER — Emergency Department (HOSPITAL_COMMUNITY)
Admission: EM | Admit: 2016-10-27 | Discharge: 2016-10-27 | Disposition: A | Discharge status: Home / Self Care | Payer: Medicare Other | Attending: Emergency Medicine | Admitting: Emergency Medicine

## 2016-10-27 DIAGNOSIS — I10 Essential (primary) hypertension: Secondary | ICD-10-CM | POA: Insufficient documentation

## 2016-10-27 DIAGNOSIS — R112 Nausea with vomiting, unspecified: Secondary | ICD-10-CM | POA: Insufficient documentation

## 2016-10-27 DIAGNOSIS — E876 Hypokalemia: Secondary | ICD-10-CM | POA: Insufficient documentation

## 2016-10-27 DIAGNOSIS — R51 Headache: Secondary | ICD-10-CM | POA: Insufficient documentation

## 2016-10-27 DIAGNOSIS — Z79899 Other long term (current) drug therapy: Secondary | ICD-10-CM | POA: Insufficient documentation

## 2016-10-27 DIAGNOSIS — R519 Headache, unspecified: Secondary | ICD-10-CM

## 2016-10-27 LAB — I-STAT CG4 LACTIC ACID, ED
Lactic Acid, Venous: 2.37 mmol/L (ref 0.5–1.9)
Lactic Acid, Venous: 1.7 mmol/L (ref 0.5–1.9)

## 2016-10-27 LAB — CBC WITH DIFFERENTIAL/PLATELET
BASOS PCT: 0 %
Basophils Absolute: 0 10*3/uL (ref 0.0–0.1)
EOS ABS: 0 10*3/uL (ref 0.0–0.7)
EOS PCT: 0 %
HEMATOCRIT: 39.8 % (ref 36.0–46.0)
Hemoglobin: 13.3 g/dL (ref 12.0–15.0)
Lymphocytes Relative: 6 %
Lymphs Abs: 0.4 10*3/uL — ABNORMAL LOW (ref 0.7–4.0)
MCH: 27.9 pg (ref 26.0–34.0)
MCHC: 33.4 g/dL (ref 30.0–36.0)
MCV: 83.4 fL (ref 78.0–100.0)
MONO ABS: 0.2 10*3/uL (ref 0.1–1.0)
Monocytes Relative: 3 %
Neutro Abs: 5.6 10*3/uL (ref 1.7–7.7)
Neutrophils Relative %: 91 %
Platelets: 245 10*3/uL (ref 150–400)
RBC: 4.77 MIL/uL (ref 3.87–5.11)
RDW: 13.6 % (ref 11.5–15.5)
WBC: 6.2 10*3/uL (ref 4.0–10.5)

## 2016-10-27 LAB — COMPREHENSIVE METABOLIC PANEL
ALBUMIN: 3.8 g/dL (ref 3.5–5.0)
ALT: 19 U/L (ref 14–54)
ANION GAP: 9 (ref 5–15)
AST: 23 U/L (ref 15–41)
Alkaline Phosphatase: 68 U/L (ref 38–126)
BILIRUBIN TOTAL: 0.5 mg/dL (ref 0.3–1.2)
BUN: 27 mg/dL — ABNORMAL HIGH (ref 6–20)
CO2: 25 mmol/L (ref 22–32)
Calcium: 9 mg/dL (ref 8.9–10.3)
Chloride: 103 mmol/L (ref 101–111)
Creatinine, Ser: 0.67 mg/dL (ref 0.44–1.00)
GFR calc Af Amer: >60 mL/min (ref >60)
GFR calc non Af Amer: >60 mL/min (ref >60)
GLUCOSE: 166 mg/dL — AB (ref 65–99)
POTASSIUM: 3 mmol/L — AB (ref 3.5–5.1)
SODIUM: 137 mmol/L (ref 135–145)
TOTAL PROTEIN: 7.2 g/dL (ref 6.5–8.1)

## 2016-10-27 LAB — TROPONIN I

## 2016-10-27 LAB — LIPASE, BLOOD: Lipase: 21 U/L (ref 11–51)

## 2016-10-27 LAB — COOXEMETRY PANEL
Carboxyhemoglobin: 0.7 % (ref 0.5–1.5)
Methemoglobin: 0.8 % (ref 0.0–1.5)
O2 SAT: 98.4 %
Total hemoglobin: 13.4 g/dL (ref 12.0–16.0)

## 2016-10-27 MED ORDER — TETRACAINE HCL 0.5 % OP SOLN
2.0000 [drp] | Freq: Once | OPHTHALMIC | Status: AC
Start: 2016-10-27 — End: 2016-10-27
  Administered 2016-10-27: 2 [drp] via OPHTHALMIC
  Filled 2016-10-27: qty 4

## 2016-10-27 MED ORDER — SODIUM CHLORIDE 0.9 % IV BOLUS (SEPSIS)
1000.0000 mL | Freq: Once | INTRAVENOUS | Status: AC
  Administered 2016-10-27: 1000 mL via INTRAVENOUS

## 2016-10-27 MED ORDER — SODIUM CHLORIDE 0.9 % IV BOLUS (SEPSIS): 1000.0000 mL | Freq: Once | INTRAVENOUS | Status: DC

## 2016-10-27 MED ORDER — ONDANSETRON 4 MG PO TBDP: 4.0000 mg | ORAL_TABLET | Freq: Three times a day (TID) | ORAL | 0 refills | Status: DC | PRN

## 2016-10-27 MED ORDER — POTASSIUM CHLORIDE CRYS ER 20 MEQ PO TBCR
40.0000 meq | EXTENDED_RELEASE_TABLET | Freq: Once | ORAL | Status: AC
  Administered 2016-10-27: 40 meq via ORAL
  Filled 2016-10-27: qty 2

## 2016-10-27 NOTE — ED Notes (Signed)
Barrister's clerk for patient at this time.

## 2016-10-27 NOTE — ED Provider Notes (Signed)
Pt transferred from [place] b/c of headache, n/v.  [name]'s CT scanner is broken, and it was felt that pt needed a head CT.  Pt is feeling much better.  Ct head ordered.  Ct scan came back and it is ok.  Pt feels back to normal.  Pt is able to tolerate po fluids.  She is stable for d/c.   Isla Pence, MD 10/27/16 [phone]

## 2016-10-27 NOTE — ED Triage Notes (Signed)
Pt c/o n/v/d since 2130 last night; pt was given 4mg  zofran IV en route to ED;  Pt states she has a headache and feels nauseous

## 2016-10-27 NOTE — ED Notes (Signed)
Report given to Charge RN at [facility] ED

## 2016-10-27 NOTE — ED Provider Notes (Signed)
[facility] DEPT Provider Note   CSN: [id number] Arrival date & time: 10/27/16  0524     History   Chief Complaint Chief Complaint  Patient presents with  . Emesis    HPI [name] is a 76 y.o. female.  Patient presents by EMS with headache and vomiting and nausea that onset yesterday evening about 9 PM. Contrary to triage note there is no diarrhea. She states she fell asleep in a recliner 8:30 PM and felt okay. When she woke up at 9:00 she had a diffuse frontal headache and vomited several times. She states she's been vomiting approximately 4-5 times all night. Headache is in the front of her head is progressively worsening. It was not there when she went to sleep. She states she's had headaches similar to this but never this intense. Denies any visual change. Denies any fever. Denies any focal weakness, numbness or tingling. Denies any abdominal pain, back pain, urinary symptoms. No chest pain or shortness of breath. She states that she did eat a fish sandwich which tasted funny. No one else ate the same thing. No recent travel or antibiotic use.   The history is provided by the patient and the EMS personnel.  Emesis   Associated symptoms include headaches. Pertinent negatives include no abdominal pain, no arthralgias, no cough, no diarrhea, no fever and no myalgias.    Past Medical History:  Diagnosis Date  . Arthritis   . Depression   . HTN (hypertension)   . Hypercholesteremia   . S/P colonoscopy 2009   adenoma, repeat 5 years    Patient Active Problem List   Diagnosis Date Noted  . Adenomatous colon polyp 03/18/2013    Past Surgical History:  Procedure Laterality Date  . CATARACT EXTRACTION W/PHACO Right 04/14/2014   Procedure: CATARACT EXTRACTION PHACO AND INTRAOCULAR LENS PLACEMENT (IOC) CDE=6.22;  Surgeon: Elta Guadeloupe T. Gershon Crane, MD;  Location: [facility];  Service: Ophthalmology;  Laterality: Right;  . CATARACT EXTRACTION W/PHACO Left 06/16/2014   Procedure:  CATARACT EXTRACTION PHACO AND INTRAOCULAR LENS PLACEMENT (IOC);  Surgeon: Elta Guadeloupe T. Gershon Crane, MD;  Location: [facility];  Service: Ophthalmology;  Laterality: Left;  CDE:8.01  . COLONOSCOPY  02/17/2005   [id number] polyp and right colon polyp, status post snare polypectomy/Otherwise normal rectum and colon  . COLONOSCOPY  02/18/2008   RMR: Normal rectum/Polyp in the hepatic flexure removed with snare cecal polyps were cold biopsied/removed; remainder of the colonic mucosa appeared normal. Tubluar adenoma  . COLONOSCOPY N/A 03/26/2013   Procedure: COLONOSCOPY;  Surgeon: Daneil Dolin, MD;  Location: [facility] SU[facility];  Service: Endoscopy;  Laterality: N/A;  1:15  . TOTAL ABDOMINAL HYSTERECTOMY     fibroid tumors  . UTERINE FIBROID SURGERY      OB History    Gravida Para Term Preterm AB Living   3 3 3     3    SAB TAB Ectopic Multiple Live Births                   Home Medications    Prior to Admission medications   Medication Sig Start Date End Date Taking? Authorizing Provider  cetirizine (ZYRTEC) 10 MG tablet Take 10 mg by mouth daily.   Yes Historical Provider, MD  doxylamine, Sleep, (RA NIGHT SLEEP AID) 25 MG tablet Take 25 mg by mouth at bedtime.   Yes Historical Provider, MD  hydrochlorothiazide 25 MG tablet Take 25 mg by mouth daily.     Yes Historical Provider, MD  lisinopril (PRINIVIL,ZESTRIL) 5 MG tablet Take 5 mg by mouth daily.     Yes Historical Provider, MD  lovastatin (MEVACOR) 20 MG tablet Take 20 mg by mouth at bedtime.     Yes Historical Provider, MD  naproxen sodium (ANAPROX) 220 MG tablet Take 440 mg by mouth daily.   Yes Historical Provider, MD    Family History Family History  Problem Relation Age of Onset  . Stroke Brother   . Stroke Mother     deceased  . Emphysema Father     deceased  . Colon cancer Neg Hx     Social History Social History  Substance Use Topics  . Smoking status: Never Smoker  . Smokeless tobacco: Never Used  . Alcohol use No      Allergies   Patient has no known allergies.   Review of Systems Review of Systems  Constitutional: Positive for activity change and appetite change. Negative for fatigue and fever.  HENT: Negative for congestion and rhinorrhea.   Eyes: Negative for photophobia and visual disturbance.  Respiratory: Negative for cough, chest tightness and shortness of breath.   Cardiovascular: Negative for chest pain.  Gastrointestinal: Positive for nausea and vomiting. Negative for abdominal pain and diarrhea.  Genitourinary: Negative for dysuria and hematuria.  Musculoskeletal: Negative for arthralgias and myalgias.  Skin: Negative for rash.  Neurological: Positive for weakness and headaches. Negative for dizziness, light-headedness and numbness.   A complete 10 system review of systems was obtained and all systems are negative except as noted in the HPI and PMH.    Physical Exam Updated Vital Signs BP 121/74   Pulse 111   Temp 97.7 F (36.5 C) (Oral)   Resp 13   Ht 5\' 9"  (1.753 m)   Wt 219 lb (99.3 kg)   SpO2 97%   BMI 32.34 kg/m   Physical Exam  Constitutional: She is oriented to person, place, and time. She appears well-developed and well-nourished. No distress.  HENT:  Head: Normocephalic and atraumatic.  Mouth/Throat: Oropharynx is clear and moist. No oropharyngeal exudate.  No temporal artery tenderness Mildly dry mucus membranes  Eyes: Conjunctivae and EOM are normal. Pupils are equal, round, and reactive to light.  IOP 14 bilaterally  Neck: Normal range of motion. Neck supple.  No meningismus.  Cardiovascular: Normal rate, normal heart sounds and intact distal pulses.   No murmur heard. Tachycardic 110-120s  Pulmonary/Chest: Effort normal and breath sounds normal. No respiratory distress.  Abdominal: Soft. There is no tenderness. There is no rebound and no guarding.  Musculoskeletal: Normal range of motion. She exhibits no edema or tenderness.  Neurological: She is  alert and oriented to person, place, and time. No cranial nerve deficit. She exhibits normal muscle tone. Coordination normal.  No ataxia on finger to nose bilaterally. No pronator drift. 5/5 strength throughout. CN 2-12 intact.Equal grip strength. Sensation intact.   Skin: Skin is warm.  Psychiatric: She has a normal mood and affect. Her behavior is normal.  Nursing note and vitals reviewed.    ED Treatments / Results  Labs (all labs ordered are listed, but only abnormal results are displayed) Labs Reviewed  CBC WITH DIFFERENTIAL/PLATELET - Abnormal; Notable for the following:       Result Value   Lymphs Abs 0.4 (*)    All other components within normal limits  I-STAT CG4 LACTIC ACID, ED - Abnormal; Notable for the following:    Lactic Acid, Venous 2.37 (*)    All other components  within normal limits  COMPREHENSIVE METABOLIC PANEL  LIPASE, BLOOD  URINALYSIS, ROUTINE W REFLEX MICROSCOPIC  TROPONIN I    EKG  EKG Interpretation  Date/Time:  Friday October 27 2016 05:51:04 EST Ventricular Rate:  111 PR Interval:    QRS Duration: 88 QT Interval:  332 QTC Calculation: 452 R Axis:   56 Text Interpretation:  Sinus tachycardia No significant change was found Confirmed by Wyvonnia Dusky  MD, Giulian Goldring ([id number]) on 10/27/2016 6:16:21 AM       Radiology No results found.  Procedures Procedures (including critical care time)  Medications Ordered in ED Medications  sodium chloride 0.9 % bolus 1,000 mL (1,000 mLs Intravenous New Bag/Given 10/27/16 0603)  tetracaine (PONTOCAINE) 0.5 % ophthalmic solution 2 drop (not administered)  sodium chloride 0.9 % bolus 1,000 mL (not administered)  sodium chloride 0.9 % bolus 1,000 mL (not administered)     Initial Impression / Assessment and Plan / ED Course  I have reviewed the triage vital signs and the nursing notes.  Pertinent labs & imaging results that were available during my care of the patient were reviewed by me and considered in my  medical decision making (see chart for details).  Clinical Course   Patient with frontal headache that woke her from sleep associated with nausea and vomiting. It is progressively worsening. Denies any focal neurological deficits. No fever. Abdomen is benign.  Patient is tachycardic and mildly dry appearing will be given IV fluids. Lactate is minimally elevated.  Patient has frontal headache that woke her from sleep associated with nausea and vomiting.  CT scan is indicated to evaluate for [facility].  CT not functional at this facility at this time.  Patient aggressively hydrated given tachycardia and elevated lactate.  She is agreeable to transfer for head CT.  States headache is improving. Was 8/10 at home, 5/10 on arrival to the ED, now 3/10. nonfocal neuro exam. IOP normal.  D/w Dr. Christy Gentles who accepts patient to [facility] for CT head. If CT negative and patient symptomatically improved, and HR improved, suspect she will be able to be discharged.   Final Clinical Impressions(s) / ED Diagnoses   Final diagnoses:  Headache, unspecified headache type  Non-intractable vomiting with nausea, unspecified vomiting type  Hypokalemia    New Prescriptions New Prescriptions   No medications on file     Ezequiel Essex, MD 10/27/16 [phone]

## 2016-10-27 NOTE — ED Notes (Addendum)
Pt. Able to tolerate fluids at this time.

## 2016-10-27 NOTE — ED Notes (Signed)
EDP at bedside  

## 2016-12-04 ENCOUNTER — Other Ambulatory Visit (HOSPITAL_COMMUNITY): Payer: Self-pay | Admitting: Family

## 2016-12-04 DIAGNOSIS — Z1231 Encounter for screening mammogram for malignant neoplasm of breast: Secondary | ICD-10-CM

## 2016-12-22 ENCOUNTER — Ambulatory Visit (HOSPITAL_COMMUNITY): Payer: Medicare Other

## 2016-12-25 ENCOUNTER — Ambulatory Visit (HOSPITAL_COMMUNITY)
Admission: RE | Admit: 2016-12-25 | Discharge: 2016-12-25 | Disposition: A | Discharge status: Home / Self Care | Payer: Medicare Other | Source: Ambulatory Visit | Attending: Family | Admitting: Family

## 2016-12-25 DIAGNOSIS — Z1231 Encounter for screening mammogram for malignant neoplasm of breast: Secondary | ICD-10-CM

## 2017-09-11 DIAGNOSIS — R350 Frequency of micturition: Secondary | ICD-10-CM | POA: Diagnosis not present

## 2017-09-11 DIAGNOSIS — Z1389 Encounter for screening for other disorder: Secondary | ICD-10-CM | POA: Diagnosis not present

## 2017-09-11 DIAGNOSIS — I10 Essential (primary) hypertension: Secondary | ICD-10-CM | POA: Diagnosis not present

## 2017-09-11 DIAGNOSIS — E785 Hyperlipidemia, unspecified: Secondary | ICD-10-CM | POA: Diagnosis not present

## 2017-10-22 DIAGNOSIS — R05 Cough: Secondary | ICD-10-CM | POA: Diagnosis not present

## 2017-10-22 DIAGNOSIS — I499 Cardiac arrhythmia, unspecified: Secondary | ICD-10-CM | POA: Diagnosis not present

## 2017-11-01 DIAGNOSIS — Z713 Dietary counseling and surveillance: Secondary | ICD-10-CM | POA: Diagnosis not present

## 2017-11-01 DIAGNOSIS — E7849 Other hyperlipidemia: Secondary | ICD-10-CM | POA: Diagnosis not present

## 2017-11-01 DIAGNOSIS — I10 Essential (primary) hypertension: Secondary | ICD-10-CM | POA: Diagnosis not present

## 2017-11-01 DIAGNOSIS — E669 Obesity, unspecified: Secondary | ICD-10-CM | POA: Diagnosis not present

## 2017-12-03 DIAGNOSIS — H524 Presbyopia: Secondary | ICD-10-CM | POA: Diagnosis not present

## 2018-02-01 ENCOUNTER — Other Ambulatory Visit (HOSPITAL_COMMUNITY): Payer: Self-pay | Admitting: Family

## 2018-02-01 DIAGNOSIS — Z1231 Encounter for screening mammogram for malignant neoplasm of breast: Secondary | ICD-10-CM

## 2018-02-04 ENCOUNTER — Ambulatory Visit (HOSPITAL_COMMUNITY)
Admission: RE | Admit: 2018-02-04 | Discharge: 2018-02-04 | Disposition: A | Discharge status: Home / Self Care | Payer: Medicare Other | Source: Ambulatory Visit | Attending: Family | Admitting: Family

## 2018-02-04 ENCOUNTER — Ambulatory Visit (HOSPITAL_COMMUNITY): Payer: Medicare Other

## 2018-02-04 DIAGNOSIS — Z1231 Encounter for screening mammogram for malignant neoplasm of breast: Secondary | ICD-10-CM

## 2018-02-18 ENCOUNTER — Encounter: Payer: Self-pay | Admitting: Internal Medicine

## 2018-03-25 ENCOUNTER — Ambulatory Visit: Payer: Medicare Other

## 2018-04-17 ENCOUNTER — Ambulatory Visit (INDEPENDENT_AMBULATORY_CARE_PROVIDER_SITE_OTHER): Payer: Self-pay

## 2018-04-17 DIAGNOSIS — Z86010 Personal history of colonic polyps: Secondary | ICD-10-CM

## 2018-04-17 MED ORDER — PEG 3350-KCL-NA BICARB-NACL 420 G PO SOLR: 4000.0000 mL | ORAL | 0 refills | Status: DC

## 2018-04-17 NOTE — Progress Notes (Signed)
Gastroenterology Pre-Procedure Review  Request Date:04/17/18 Requesting Physician: 5 year recall (last tcs RMR- 03/26/13 tubular adenoma)    PATIENT REVIEW QUESTIONS: The patient responded to the following health history questions as indicated:   Spoke with the pt regarding current guidelines for the age to stop having a tcs, she said she was doing well and would like to have one more tcs.   1. Diabetes Melitis: no 2. Joint replacements in the past 12 months: no 3. Major health problems in the past 3 months: no 4. Has an artificial valve or MVP: no 5. Has a defibrillator: no 6. Has been advised in past to take antibiotics in advance of a procedure like teeth cleaning: no 7. Family history of colon cancer: yes (brother in 18's)  31. Alcohol Use: no 9. History of sleep apnea: no  10. History of coronary artery or other vascular stents placed within the last 12 months: no 11. History of any prior anesthesia complications: no    MEDICATIONS & ALLERGIES:    Patient reports the following regarding taking any blood thinners:   Plavix? no Aspirin? no Coumadin? no Brilinta? no Xarelto? no Eliquis? no Pradaxa? no Savaysa? no Effient? no  Patient confirms/reports the following medications:  Current Outpatient Medications  Medication Sig Dispense Refill  . cetirizine (ZYRTEC) 10 MG tablet Take 10 mg by mouth daily.    . hydrochlorothiazide 25 MG tablet Take 25 mg by mouth daily.      Marland Kitchen lisinopril (PRINIVIL,ZESTRIL) 5 MG tablet Take 5 mg by mouth daily.      Marland Kitchen lovastatin (MEVACOR) 20 MG tablet Take 20 mg by mouth at bedtime.       No current facility-administered medications for this visit.     Patient confirms/reports the following allergies:  No Known Allergies  No orders of the defined types were placed in this encounter.   AUTHORIZATION INFORMATION Primary Insurance: BCBS [place] medicare,  [place] #: [id number] Pre-Cert / Auth required: no   SCHEDULE INFORMATION: Procedure has  been scheduled as follows:  Date: 06/05/18, Time: 10:30  Location: [facility]  This Gastroenterology Pre-Precedure Review Form is being routed to the following provider(s): AB

## 2018-04-17 NOTE — Patient Instructions (Signed)
[name]   August 20, 1941 MRN: [id number]    Procedure Date: 06/05/18 Time to register: 9:30am Place to register: [facility] Procedure Time: 10:30am Scheduled provider: R. Garfield Cornea, MD  PREPARATION FOR COLONOSCOPY WITH TRI-LYTE SPLIT PREP  Please notify us immediately if you are diabetic, take iron supplements, or if you are on Coumadin or any other blood thinners.     You will need to purchase 1 fleet enema and 1 box of Bisacodyl 38m tablets.   2 DAYS BEFORE PROCEDURE:  DATE: 06/03/18  DAY: Monday Begin clear liquid diet AFTER your lunch meal. NO SOLID FOODS after this point.  1 DAY BEFORE PROCEDURE:  DATE: 06/04/18   DAY: Tuesday Continue clear liquids the entire day - NO SOLID FOOD.     At 2:00 pm:  Take 2 Bisacodyl tablets.   At 4:00pm:  Start drinking your solution. Make sure you mix well per instructions on the bottle. Try to drink 1 (one) 8 ounce glass every 10-15 minutes until you have consumed HALF the jug. You should complete by 6:00pm.You must keep the left over solution refrigerated until completed next day.  Continue clear liquids. You must drink plenty of clear liquids to prevent dehyration and kidney failure. Nothing to eat or drink after midnight.  EXCEPTION: If you take medications for your heart, blood pressure or breathing, you may take these medications with a small amount of clear liquid.    DAY OF PROCEDURE:   DATE: 06/05/18  DAY: Wednesday    Five hours before your procedure time @ 5:30am:  Finish remaining amout of bowel prep, drinking 1 (one) 8 ounce glass every 10-15 minutes until complete. You have two hours to consume remaining prep.   Three hours before your procedure time '@7' :30am:  Nothing by mouth.   At least one hour before going to the hospital:  Give yourself one Fleet enema. You may take your morning medications with sip of water unless we have instructed otherwise.      Please see below for Dietary  Information.  CLEAR LIQUIDS INCLUDE:  Water Jello (NOT red in color)   Ice Popsicles (NOT red in color)   Tea (sugar ok, no milk/cream) Powdered fruit flavored drinks  Coffee (sugar ok, no milk/cream) Gatorade/ Lemonade/ Kool-Aid  (NOT red in color)   Juice: apple, white grape, white cranberry Soft drinks  Clear bullion, consomme, broth (fat free beef/chicken/vegetable)  Carbonated beverages (any kind)  Strained chicken noodle soup Hard Candy   Remember: Clear liquids are liquids that will allow you to see your fingers on the other side of a clear glass. Be sure liquids are NOT red in color, and not cloudy, but CLEAR.  DO NOT EAT OR DRINK ANY OF THE FOLLOWING:  Dairy products of any kind   Cranberry juice Tomato juice / V8 juice   Grapefruit juice Orange juice     Red grape juice  Do not eat any solid foods, including such foods as: cereal, oatmeal, yogurt, fruits, vegetables, creamed soups, eggs, bread, crackers, pureed foods in a blender, etc.   HELPFUL HINTS FOR DRINKING PREP SOLUTION:   Make sure prep is extremely cold. Mix and refrigerate the the morning of the prep. You may also put in the freezer.   You may try mixing some Crystal Light or Country Time Lemonade if you prefer. Mix in small amounts; add more if necessary.  Try drinking through a straw  Rinse mouth with water or a mouthwash between glasses,  to remove after-taste.  Try sipping on a cold beverage /ice/ popsicles between glasses of prep.  Place a piece of sugar-free hard candy in mouth between glasses.  If you become nauseated, try consuming smaller amounts, or stretch out the time between glasses. Stop for 30-60 minutes, then slowly start back drinking.        OTHER INSTRUCTIONS  You will need a responsible adult at least 76 years of age to accompany you and drive you home. This person must remain in the waiting room during your procedure. The hospital will cancel your procedure if you do not have a  responsible adult with you.   1. Wear loose fitting clothing that is easily removed. 2. Leave jewelry and other valuables at home.  3. Remove all body piercing jewelry and leave at home. 4. Total time from sign-in until discharge is approximately 2-3 hours. 5. You should go home directly after your procedure and rest. You can resume normal activities the day after your procedure. 6. The day of your procedure you should not:  Drive  Make legal decisions  Operate machinery  Drink alcohol  Return to work   You may call the office (Dept: [phone]) before 5:00pm, or page the doctor on call [phone]) after 5:00pm, for further instructions, if necessary.   Insurance Information YOU WILL NEED TO CHECK WITH YOUR INSURANCE COMPANY FOR THE BENEFITS OF COVERAGE YOU HAVE FOR THIS PROCEDURE.  UNFORTUNATELY, NOT ALL INSURANCE COMPANIES HAVE BENEFITS TO COVER ALL OR PART OF THESE TYPES OF PROCEDURES.  IT IS YOUR RESPONSIBILITY TO CHECK YOUR BENEFITS, HOWEVER, WE WILL BE GLAD TO ASSIST YOU WITH ANY CODES YOUR INSURANCE COMPANY MAY NEED.    PLEASE NOTE THAT MOST INSURANCE COMPANIES WILL NOT COVER A SCREENING COLONOSCOPY FOR PEOPLE UNDER THE AGE OF 50  IF YOU HAVE BCBS INSURANCE, YOU MAY HAVE BENEFITS FOR A SCREENING COLONOSCOPY BUT IF POLYPS ARE FOUND THE DIAGNOSIS WILL CHANGE AND THEN YOU MAY HAVE A DEDUCTIBLE THAT WILL NEED TO BE MET. SO PLEASE MAKE SURE YOU CHECK YOUR BENEFITS FOR A SCREENING COLONOSCOPY AS WELL AS A DIAGNOSTIC COLONOSCOPY.

## 2018-04-22 NOTE — Progress Notes (Signed)
She is right at 64. Since doing well without any health issues, can pursue.

## 2018-04-24 ENCOUNTER — Telehealth: Payer: Self-pay | Admitting: Internal Medicine

## 2018-04-24 NOTE — Telephone Encounter (Signed)
noted 

## 2018-04-24 NOTE — Telephone Encounter (Signed)
PATIENT CALLED AND SAID THAT HER INSURANCE WILL PAY FOR HER TCS

## 2018-05-06 DIAGNOSIS — E669 Obesity, unspecified: Secondary | ICD-10-CM | POA: Diagnosis not present

## 2018-05-06 DIAGNOSIS — Z713 Dietary counseling and surveillance: Secondary | ICD-10-CM | POA: Diagnosis not present

## 2018-05-06 DIAGNOSIS — E7849 Other hyperlipidemia: Secondary | ICD-10-CM | POA: Diagnosis not present

## 2018-05-06 DIAGNOSIS — R300 Dysuria: Secondary | ICD-10-CM | POA: Diagnosis not present

## 2018-05-06 DIAGNOSIS — I10 Essential (primary) hypertension: Secondary | ICD-10-CM | POA: Diagnosis not present

## 2018-06-05 ENCOUNTER — Encounter (HOSPITAL_COMMUNITY)
Admission: RE | Disposition: A | Discharge status: Home / Self Care | Payer: Self-pay | Source: Ambulatory Visit | Attending: Internal Medicine

## 2018-06-05 ENCOUNTER — Encounter (HOSPITAL_COMMUNITY): Payer: Self-pay | Admitting: *Deleted

## 2018-06-05 ENCOUNTER — Ambulatory Visit (HOSPITAL_COMMUNITY)
Admission: RE | Admit: 2018-06-05 | Discharge: 2018-06-05 | Disposition: A | Discharge status: Home / Self Care | Payer: Medicare Other | Source: Ambulatory Visit | Attending: Internal Medicine | Admitting: Internal Medicine

## 2018-06-05 ENCOUNTER — Other Ambulatory Visit: Payer: Self-pay

## 2018-06-05 DIAGNOSIS — E7800 Pure hypercholesterolemia, unspecified: Secondary | ICD-10-CM | POA: Insufficient documentation

## 2018-06-05 DIAGNOSIS — Z79899 Other long term (current) drug therapy: Secondary | ICD-10-CM | POA: Insufficient documentation

## 2018-06-05 DIAGNOSIS — F329 Major depressive disorder, single episode, unspecified: Secondary | ICD-10-CM | POA: Insufficient documentation

## 2018-06-05 DIAGNOSIS — Z800 Family history of malignant neoplasm of digestive organs: Secondary | ICD-10-CM | POA: Insufficient documentation

## 2018-06-05 DIAGNOSIS — Z1211 Encounter for screening for malignant neoplasm of colon: Secondary | ICD-10-CM | POA: Diagnosis not present

## 2018-06-05 DIAGNOSIS — Z86010 Personal history of colonic polyps: Secondary | ICD-10-CM | POA: Insufficient documentation

## 2018-06-05 DIAGNOSIS — I10 Essential (primary) hypertension: Secondary | ICD-10-CM | POA: Insufficient documentation

## 2018-06-05 DIAGNOSIS — Q438 Other specified congenital malformations of intestine: Secondary | ICD-10-CM | POA: Diagnosis not present

## 2018-06-05 HISTORY — PX: COLONOSCOPY: SHX5424

## 2018-06-05 SURGERY — COLONOSCOPY
Anesthesia: Moderate Sedation

## 2018-06-05 MED ORDER — ONDANSETRON HCL 4 MG/2ML IJ SOLN
INTRAMUSCULAR | Status: AC
  Filled 2018-06-05: qty 2

## 2018-06-05 MED ORDER — ONDANSETRON HCL 4 MG/2ML IJ SOLN
INTRAMUSCULAR | Status: DC | PRN
  Administered 2018-06-05: 4 mg via INTRAVENOUS

## 2018-06-05 MED ORDER — SODIUM CHLORIDE 0.9% FLUSH
INTRAVENOUS | Status: AC
  Filled 2018-06-05: qty 10

## 2018-06-05 MED ORDER — MEPERIDINE HCL 100 MG/ML IJ SOLN
INTRAMUSCULAR | Status: DC | PRN
  Administered 2018-06-05: 25 mg via INTRAVENOUS

## 2018-06-05 MED ORDER — SODIUM CHLORIDE 0.9 % IV SOLN
INTRAVENOUS | Status: DC
  Administered 2018-06-05: 1000 mL via INTRAVENOUS

## 2018-06-05 MED ORDER — STERILE WATER FOR IRRIGATION IR SOLN
Status: DC | PRN
  Administered 2018-06-05: 10:00:00

## 2018-06-05 MED ORDER — MIDAZOLAM HCL 5 MG/5ML IJ SOLN
INTRAMUSCULAR | Status: DC | PRN
  Administered 2018-06-05: 2 mg via INTRAVENOUS
  Administered 2018-06-05 (×3): 1 mg via INTRAVENOUS

## 2018-06-05 MED ORDER — MIDAZOLAM HCL 5 MG/5ML IJ SOLN
INTRAMUSCULAR | Status: AC
  Filled 2018-06-05: qty 5

## 2018-06-05 MED ORDER — MEPERIDINE HCL 50 MG/ML IJ SOLN
INTRAMUSCULAR | Status: AC
  Filled 2018-06-05: qty 1

## 2018-06-05 NOTE — Op Note (Signed)
[facility] Patient Name: [name] Procedure Date: 06/05/2018 10:11 AM MRN: [id number] Date of Birth: 09/30/1941 Attending MD: Norvel Richards , MD CSN: [id number] Age: 77 Admit Type: Outpatient Procedure:                Colonoscopy Indications:              High risk colon cancer surveillance: Personal                            history of colonic polyps Providers:                Norvel Richards, MD, Otis Peak B. Sharon Seller, RN,                            Nelma Rothman, Technician Referring MD:             Manual Meier. Comstock, MD Medicines:                Midazolam 6 mg IV, Meperidine 25 mg IV Complications:            No immediate complications. Estimated Blood Loss:     Estimated blood loss: none. Procedure:                Pre-Anesthesia Assessment:                           - Prior to the procedure, a History and Physical                            was performed, and patient medications and                            allergies were reviewed. The patient's tolerance of                            previous anesthesia was also reviewed. The risks                            and benefits of the procedure and the sedation                            options and risks were discussed with the patient.                            All questions were answered, and informed consent                            was obtained. Prior Anticoagulants: The patient has                            taken no previous anticoagulant or antiplatelet                            agents. ASA Grade Assessment: II - A patient with  mild systemic disease. After reviewing the risks                            and benefits, the patient was deemed in                            satisfactory condition to undergo the procedure.                           After obtaining informed consent, the colonoscope                            was passed under direct vision. Throughout the               procedure, the patient's blood pressure, pulse, and                            oxygen saturations were monitored continuously. The                            CF-HQ190L ([id number]) scope was introduced through                            the anus and advanced to the the cecum, identified                            by appendiceal orifice and ileocecal valve. The                            colonoscopy was performed without difficulty. The                            patient tolerated the procedure well. The quality                            of the bowel preparation was adequate. The                            ileocecal valve, appendiceal orifice, and rectum                            were photographed. The entire colon was well                            visualized. Scope In: 10:22:51 AM Scope Out: 10:39:35 AM Scope Withdrawal Time: 0 hours 6 minutes 13 seconds  Total Procedure Duration: 0 hours 16 minutes 44 seconds  Findings:      The perianal and digital rectal examinations were normal.      The colon (entire examined portion) was moderately redundant.      The exam was otherwise without abnormality on direct and retroflexion       views. Impression:               - Redundant colon.                           -  The examination was otherwise normal on direct                            and retroflexion views.                           - No specimens collected. Moderate Sedation:      Moderate (conscious) sedation was administered by the endoscopy nurse       and supervised by the endoscopist. The following parameters were       monitored: oxygen saturation, heart rate, blood pressure, respiratory       rate, EKG, adequacy of pulmonary ventilation, and response to care.       Total physician intraservice time was 21 minutes. Recommendation:           - Patient has a contact number available for                            emergencies. The signs and symptoms of potential                             delayed complications were discussed with the                            patient. Return to normal activities tomorrow.                            Written discharge instructions were provided to the                            patient.                           - Resume previous diet.                           - Continue present medications.                           - Await pathology results.                           - I do not recommend a future colonoscopy unless                            new symptoms develop..                           - Return to GI office PRN. Procedure Code(s):        --- Professional ---                           [phone], Colonoscopy, flexible; diagnostic, including                            collection of specimen(s) by brushing or washing,  when performed (separate procedure)                           G0500, Moderate sedation services provided by the                            same physician or other qualified health care                            professional performing a gastrointestinal                            endoscopic service that sedation supports,                            requiring the presence of an independent trained                            observer to assist in the monitoring of the                            patient's level of consciousness and physiological                            status; initial 15 minutes of intra-service time;                            patient age 62 years or older (additional time may                            be reported with [phone], as appropriate) Diagnosis Code(s):        --- Professional ---                           Z86.010, Personal history of colonic polyps                           Q43.8, Other specified congenital malformations of                            intestine CPT copyright 2017 American Medical Association. All rights reserved. The codes documented in this  report are preliminary and upon coder review may  be revised to meet current compliance requirements. Cristopher Estimable. Georgeana Oertel, MD Norvel Richards, MD 06/05/2018 11:03:24 AM This report has been signed electronically. Number of Addenda: 0

## 2018-06-05 NOTE — Discharge Instructions (Signed)
°  Colonoscopy Discharge Instructions  Read the instructions outlined below and refer to this sheet in the next few weeks. These discharge instructions provide you with general information on caring for yourself after you leave the hospital. Your doctor may also give you specific instructions. While your treatment has been planned according to the most current medical practices available, unavoidable complications occasionally occur. If you have any problems or questions after discharge, call Dr. Gala Romney at [phone]. ACTIVITY  You may resume your regular activity, but move at a slower pace for the next 24 hours.   Take frequent rest periods for the next 24 hours.   Walking will help get rid of the air and reduce the bloated feeling in your belly (abdomen).   No driving for 24 hours (because of the medicine (anesthesia) used during the test).    Do not sign any important legal documents or operate any machinery for 24 hours (because of the anesthesia used during the test).  NUTRITION  Drink plenty of fluids.   You may resume your normal diet as instructed by your doctor.   Begin with a light meal and progress to your normal diet. Heavy or fried foods are harder to digest and may make you feel sick to your stomach (nauseated).   Avoid alcoholic beverages for 24 hours or as instructed.  MEDICATIONS  You may resume your normal medications unless your doctor tells you otherwise.  WHAT YOU CAN EXPECT TODAY  Some feelings of bloating in the abdomen.   Passage of more gas than usual.   Spotting of blood in your stool or on the toilet paper.  IF YOU HAD POLYPS REMOVED DURING THE COLONOSCOPY:  No aspirin products for 7 days or as instructed.   No alcohol for 7 days or as instructed.   Eat a soft diet for the next 24 hours.  FINDING OUT THE RESULTS OF YOUR TEST Not all test results are available during your visit. If your test results are not back during the visit, make an appointment  with your caregiver to find out the results. Do not assume everything is normal if you have not heard from your caregiver or the medical facility. It is important for you to follow up on all of your test results.  SEEK IMMEDIATE MEDICAL ATTENTION IF:  You have more than a spotting of blood in your stool.   Your belly is swollen (abdominal distention).   You are nauseated or vomiting.   You have a temperature over 101.   You have abdominal pain or discomfort that is severe or gets worse throughout the day.     Your colonoscopy today was normal.  I do not recommend future  Colonoscopy unless new symptoms develop.

## 2018-06-05 NOTE — H&P (Signed)
@LOGO @   Primary Care Physician:  Joyice Faster, FNP Primary Gastroenterologist:  Dr. Gala Romney  Pre-Procedure History & Physical: HPI:  [name] is a 77 y.o. female here for for surveillance colonoscopy. History of multiple adenomas on multiple colonoscopies.  Discussed one more colonoscopy with patient feels she his in good shape and wants one more TCS. Past Medical History:  Diagnosis Date  . Arthritis   . Depression   . HTN (hypertension)   . Hypercholesteremia   . S/P colonoscopy 2009   adenoma, repeat 5 years    Past Surgical History:  Procedure Laterality Date  . CATARACT EXTRACTION W/PHACO Right 04/14/2014   Procedure: CATARACT EXTRACTION PHACO AND INTRAOCULAR LENS PLACEMENT (IOC) CDE=6.22;  Surgeon: Elta Guadeloupe T. Gershon Crane, MD;  Location: [facility];  Service: Ophthalmology;  Laterality: Right;  . CATARACT EXTRACTION W/PHACO Left 06/16/2014   Procedure: CATARACT EXTRACTION PHACO AND INTRAOCULAR LENS PLACEMENT (IOC);  Surgeon: Elta Guadeloupe T. Gershon Crane, MD;  Location: [facility];  Service: Ophthalmology;  Laterality: Left;  CDE:8.01  . COLONOSCOPY  02/17/2005   OVF:IEPPIR polyp and right colon polyp, status post snare polypectomy/Otherwise normal rectum and colon  . COLONOSCOPY  02/18/2008   RMR: Normal rectum/Polyp in the hepatic flexure removed with snare cecal polyps were cold biopsied/removed; remainder of the colonic mucosa appeared normal. Tubluar adenoma  . COLONOSCOPY N/A 03/26/2013   Procedure: COLONOSCOPY;  Surgeon: Daneil Dolin, MD;  Location: [facility];  Service: Endoscopy;  Laterality: N/A;  1:15  . TOTAL ABDOMINAL HYSTERECTOMY     fibroid tumors  . UTERINE FIBROID SURGERY      Prior to Admission medications   Medication Sig Start Date End Date Taking? Authorizing Provider  cetirizine (ZYRTEC) 10 MG tablet Take 10 mg by mouth daily.   Yes [provider]  hydrochlorothiazide 25 MG tablet Take 25 mg by mouth daily.     Yes [provider]  lisinopril  (PRINIVIL,ZESTRIL) 5 MG tablet Take 5 mg by mouth daily.     Yes [provider]  lovastatin (MEVACOR) 20 MG tablet Take 20 mg by mouth at bedtime.     Yes [provider]  polyethylene glycol-electrolytes (TRILYTE) 420 g solution Take 4,000 mLs by mouth as directed. 04/17/18  Yes Annitta Needs, NP    Allergies as of 04/17/2018  . (No Known Allergies)    Family History  Problem Relation Age of Onset  . Stroke Brother   . Stroke Mother        deceased  . Emphysema Father        deceased  . Colon cancer Neg Hx     Social History   Socioeconomic History  . Marital status: Divorced    Spouse name: Not on file  . Number of children: Not on file  . Years of education: Not on file  . Highest education level: Not on file  Occupational History  . Occupation: CNA    Employer: [employer]    Comment: Alvis [employer], part-time  Social Needs  . Financial resource strain: Not on file  . Food insecurity:    Worry: Not on file    Inability: Not on file  . Transportation needs:    Medical: Not on file    Non-medical: Not on file  Tobacco Use  . Smoking status: Never Smoker  . Smokeless tobacco: Never Used  Substance and Sexual Activity  . Alcohol use: No  . Drug use: No  . Sexual activity: Never  Birth control/protection: None  Lifestyle  . Physical activity:    Days per week: Not on file    Minutes per session: Not on file  . Stress: Not on file  Relationships  . Social connections:    Talks on phone: Not on file    Gets together: Not on file    Attends religious service: Not on file    Active member of club or organization: Not on file    Attends meetings of clubs or organizations: Not on file    Relationship status: Not on file  . Intimate partner violence:    Fear of current or ex partner: Not on file    Emotionally abused: Not on file    Physically abused: Not on file    Forced sexual activity: Not on file  Other Topics Concern  . Not on file   Social History Narrative  . Not on file    Review of Systems: See HPI, otherwise negative ROS  Physical Exam: BP 134/87   Pulse 93   Temp 98.2 F (36.8 C) (Oral)   Resp 13   Ht 5\' 9"  (1.753 m)   Wt 92.5 kg   SpO2 100%   BMI 30.13 kg/m  General:   Alert,  Well-developed, well-nourished, pleasant and cooperative in NAD Skin:  Intact without significant lesions or rashes. Neck:  Supple; no masses or thyromegaly. No significant cervical adenopathy. Lungs:  Clear throughout to auscultation.   No wheezes, crackles, or rhonchi. No acute distress. Heart:  Regular rate and rhythm; no murmurs, clicks, rubs,  or gallops. Abdomen: Non-distended, normal bowel sounds.  Soft and nontender without appreciable mass or hepatosplenomegaly.  Pulses:  Normal pulses noted. Extremities:  Without clubbing or edema.  Impression/Plan:  77 year old lady with a history of multiple adenoma with multiple colonoscopies.  Here for surveillance colonoscopy.  The risks, benefits, limitations, alternatives and imponderables have been reviewed with the patient. Questions have been answered. All parties are agreeable.      Notice: This dictation was prepared with Dragon dictation along with smaller phrase technology. Any transcriptional errors that result from this process are unintentional and may not be corrected upon review.

## 2018-06-10 ENCOUNTER — Encounter (HOSPITAL_COMMUNITY): Payer: Self-pay | Admitting: Internal Medicine

## 2018-07-16 ENCOUNTER — Telehealth: Payer: Self-pay | Admitting: Orthopaedic Surgery

## 2018-07-16 NOTE — Telephone Encounter (Signed)
Patient left message requesting an appointment. I called her back and talked with her about her concerns. I suggested she may want to see her PCP first since its cramps.I told her that if her PCP thought she needed to be seen here then he would refer her here or maybe even some other office. Patient stated she understood and would give her PCP's office a call.

## 2018-07-17 DIAGNOSIS — M65331 Trigger finger, right middle finger: Secondary | ICD-10-CM | POA: Diagnosis not present

## 2018-07-17 DIAGNOSIS — R252 Cramp and spasm: Secondary | ICD-10-CM | POA: Diagnosis not present

## 2018-07-22 DIAGNOSIS — I10 Essential (primary) hypertension: Secondary | ICD-10-CM | POA: Diagnosis not present

## 2018-07-22 DIAGNOSIS — R252 Cramp and spasm: Secondary | ICD-10-CM | POA: Diagnosis not present

## 2018-07-22 DIAGNOSIS — Z713 Dietary counseling and surveillance: Secondary | ICD-10-CM | POA: Diagnosis not present

## 2018-07-22 DIAGNOSIS — E669 Obesity, unspecified: Secondary | ICD-10-CM | POA: Diagnosis not present

## 2018-07-22 DIAGNOSIS — E7849 Other hyperlipidemia: Secondary | ICD-10-CM | POA: Diagnosis not present

## 2018-10-08 DIAGNOSIS — I10 Essential (primary) hypertension: Secondary | ICD-10-CM | POA: Diagnosis not present

## 2018-10-08 DIAGNOSIS — R05 Cough: Secondary | ICD-10-CM | POA: Diagnosis not present

## 2018-10-08 DIAGNOSIS — Z6835 Body mass index (BMI) 35.0-35.9, adult: Secondary | ICD-10-CM | POA: Diagnosis not present

## 2018-10-08 DIAGNOSIS — Z23 Encounter for immunization: Secondary | ICD-10-CM | POA: Diagnosis not present

## 2018-10-26 ENCOUNTER — Other Ambulatory Visit: Payer: Self-pay

## 2018-10-26 ENCOUNTER — Encounter (HOSPITAL_COMMUNITY): Payer: Self-pay | Admitting: *Deleted

## 2018-10-26 ENCOUNTER — Emergency Department (HOSPITAL_COMMUNITY)
Admission: EM | Admit: 2018-10-26 | Discharge: 2018-10-26 | Disposition: A | Discharge status: Home / Self Care | Payer: Medicare Other | Attending: Emergency Medicine | Admitting: Emergency Medicine

## 2018-10-26 DIAGNOSIS — R112 Nausea with vomiting, unspecified: Secondary | ICD-10-CM | POA: Diagnosis present

## 2018-10-26 DIAGNOSIS — I10 Essential (primary) hypertension: Secondary | ICD-10-CM | POA: Diagnosis not present

## 2018-10-26 DIAGNOSIS — K529 Noninfective gastroenteritis and colitis, unspecified: Secondary | ICD-10-CM

## 2018-10-26 DIAGNOSIS — Z79899 Other long term (current) drug therapy: Secondary | ICD-10-CM | POA: Diagnosis not present

## 2018-10-26 LAB — CBC WITH DIFFERENTIAL/PLATELET
ABS IMMATURE GRANULOCYTES: 0.02 10*3/uL (ref 0.00–0.07)
Basophils Absolute: 0 10*3/uL (ref 0.0–0.1)
Basophils Relative: 0 %
Eosinophils Absolute: 0 10*3/uL (ref 0.0–0.5)
Eosinophils Relative: 1 %
HCT: 43.6 % (ref 36.0–46.0)
Hemoglobin: 13.3 g/dL (ref 12.0–15.0)
Immature Granulocytes: 1 %
LYMPHS ABS: 0.6 10*3/uL — AB (ref 0.7–4.0)
LYMPHS PCT: 13 %
MCH: 26 pg (ref 26.0–34.0)
MCHC: 30.5 g/dL (ref 30.0–36.0)
MCV: 85.3 fL (ref 80.0–100.0)
MONO ABS: 0.3 10*3/uL (ref 0.1–1.0)
Monocytes Relative: 7 %
NEUTROS PCT: 78 %
Neutro Abs: 3.5 10*3/uL (ref 1.7–7.7)
Platelets: 238 10*3/uL (ref 150–400)
RBC: 5.11 MIL/uL (ref 3.87–5.11)
RDW: 14 % (ref 11.5–15.5)
WBC: 4.4 10*3/uL (ref 4.0–10.5)
nRBC: 0 % (ref 0.0–0.2)

## 2018-10-26 LAB — COMPREHENSIVE METABOLIC PANEL
ALT: 17 U/L (ref 0–44)
AST: 18 U/L (ref 15–41)
Albumin: 3.8 g/dL (ref 3.5–5.0)
Alkaline Phosphatase: 71 U/L (ref 38–126)
Anion gap: 7 (ref 5–15)
BUN: 19 mg/dL (ref 8–23)
CO2: 25 mmol/L (ref 22–32)
Calcium: 8.8 mg/dL — ABNORMAL LOW (ref 8.9–10.3)
Chloride: 105 mmol/L (ref 98–111)
Creatinine, Ser: 0.56 mg/dL (ref 0.44–1.00)
GFR calc Af Amer: >60 mL/min (ref >60)
GFR calc non Af Amer: >60 mL/min (ref >60)
Glucose, Bld: 112 mg/dL — ABNORMAL HIGH (ref 70–99)
Potassium: 3.5 mmol/L (ref 3.5–5.1)
Sodium: 137 mmol/L (ref 135–145)
Total Bilirubin: 0.6 mg/dL (ref 0.3–1.2)
Total Protein: 7.2 g/dL (ref 6.5–8.1)

## 2018-10-26 LAB — LIPASE, BLOOD: Lipase: 27 U/L (ref 11–51)

## 2018-10-26 MED ORDER — ONDANSETRON HCL 4 MG PO TABS: 4.0000 mg | ORAL_TABLET | Freq: Four times a day (QID) | ORAL | 0 refills | Status: AC

## 2018-10-26 MED ORDER — ONDANSETRON HCL 4 MG/2ML IJ SOLN
4.0000 mg | Freq: Once | INTRAMUSCULAR | Status: AC
Start: 2018-10-26 — End: 2018-10-26
  Administered 2018-10-26: 4 mg via INTRAVENOUS
  Filled 2018-10-26: qty 2

## 2018-10-26 MED ORDER — SODIUM CHLORIDE 0.9 % IV BOLUS
1000.0000 mL | Freq: Once | INTRAVENOUS | Status: AC
  Administered 2018-10-26: 1000 mL via INTRAVENOUS

## 2018-10-26 NOTE — Discharge Instructions (Addendum)
Prescription for nausea medicine.  Suggest clear liquids.  Rest.

## 2018-10-26 NOTE — ED Triage Notes (Signed)
Pt c/o dizziness, headache, vomiting that started this morning. Pt woke up feeling fine and then shortly after started feeling bad. Pt reports her neighbor that lives underneath her apartment "does drugs and it comes up into my apartment and then I start feeling bad".

## 2018-10-26 NOTE — ED Provider Notes (Signed)
[facility] EMERGENCY DEPARTMENT Provider Note   CSN: [id number] Arrival date & time: 10/26/18  1749     History   Chief Complaint Chief Complaint  Patient presents with  . Emesis    HPI [name] is a 78 y.o. female.  Nausea, vomiting, dizziness since this a.m.  She claims vomiting x3 and diarrhea x2 today..  She feels slightly dehydrated.  No fever, sweats, chills, dysuria.  Severity of symptoms is mild to moderate.  Nothing makes symptoms better or worse.     Past Medical History:  Diagnosis Date  . Arthritis   . Depression   . HTN (hypertension)   . Hypercholesteremia   . S/P colonoscopy 2009   adenoma, repeat 5 years    Patient Active Problem List   Diagnosis Date Noted  . Adenomatous colon polyp 03/18/2013    Past Surgical History:  Procedure Laterality Date  . CATARACT EXTRACTION W/PHACO Right 04/14/2014   Procedure: CATARACT EXTRACTION PHACO AND INTRAOCULAR LENS PLACEMENT (IOC) CDE=6.22;  Surgeon: Elta Guadeloupe T. Gershon Crane, MD;  Location: [facility];  Service: Ophthalmology;  Laterality: Right;  . CATARACT EXTRACTION W/PHACO Left 06/16/2014   Procedure: CATARACT EXTRACTION PHACO AND INTRAOCULAR LENS PLACEMENT (IOC);  Surgeon: Elta Guadeloupe T. Gershon Crane, MD;  Location: [facility];  Service: Ophthalmology;  Laterality: Left;  CDE:8.01  . COLONOSCOPY  02/17/2005   CBS:WHQPRF polyp and right colon polyp, status post snare polypectomy/Otherwise normal rectum and colon  . COLONOSCOPY  02/18/2008   RMR: Normal rectum/Polyp in the hepatic flexure removed with snare cecal polyps were cold biopsied/removed; remainder of the colonic mucosa appeared normal. Tubluar adenoma  . COLONOSCOPY N/A 03/26/2013   Procedure: COLONOSCOPY;  Surgeon: Daneil Dolin, MD;  Location: [facility];  Service: Endoscopy;  Laterality: N/A;  1:15  . COLONOSCOPY N/A 06/05/2018   Procedure: COLONOSCOPY;  Surgeon: Daneil Dolin, MD;  Location: [facility];  Service: Endoscopy;  Laterality: N/A;  10:30  . TOTAL  ABDOMINAL HYSTERECTOMY     fibroid tumors  . UTERINE FIBROID SURGERY       OB History    Gravida  3   Para  3   Term  3   Preterm      AB      Living  3     SAB      TAB      Ectopic      Multiple      Live Births               Home Medications    Prior to Admission medications   Medication Sig Start Date End Date Taking? Authorizing Provider  acetaminophen (TYLENOL) 325 MG tablet Take 650 mg by mouth every 6 (six) hours as needed for mild pain or moderate pain.   Yes [provider]  cetirizine (ZYRTEC) 10 MG tablet Take 10 mg by mouth daily.   Yes [provider]  diphenhydramine-acetaminophen (TYLENOL PM) 25-500 MG TABS tablet Take 1 tablet by mouth at bedtime as needed (for sleep).   Yes [provider]  Glycerin-Hypromellose-PEG 400 (ARTIFICIAL TEARS) 0.2-0.2-1 % SOLN Apply 1-2 drops to eye daily as needed (for dry eyes).   Yes [provider]  hydrochlorothiazide 25 MG tablet Take 25 mg by mouth daily.     Yes [provider]  lisinopril (PRINIVIL,ZESTRIL) 5 MG tablet Take 5 mg by mouth daily.     Yes [provider]  lovastatin (MEVACOR) 20 MG tablet Take 20  mg by mouth at bedtime.     Yes [provider]  ondansetron (ZOFRAN) 4 MG tablet Take 1 tablet (4 mg total) by mouth every 6 (six) hours. 10/26/18   Nat Christen, MD    Family History Family History  Problem Relation Age of Onset  . Stroke Brother   . Stroke Mother        deceased  . Emphysema Father        deceased  . Colon cancer Neg Hx     Social History Social History   Tobacco Use  . Smoking status: Never Smoker  . Smokeless tobacco: Never Used  Substance Use Topics  . Alcohol use: No  . Drug use: No     Allergies   Patient has no known allergies.   Review of Systems Review of Systems  All other systems reviewed and are negative.    Physical Exam Updated Vital Signs BP (!) 110/91   Pulse (!) 108   Temp  98.1 F (36.7 C) (Oral)   Resp (!) 25   Ht 5\' 9"  (1.753 m)   Wt 96.6 kg   SpO2 95%   BMI 31.45 kg/m   Physical Exam Vitals signs and nursing note reviewed.  Constitutional:      Appearance: She is well-developed.     Comments: Mucous membranes are parched.  HENT:     Head: Normocephalic and atraumatic.  Eyes:     Conjunctiva/sclera: Conjunctivae normal.  Neck:     Musculoskeletal: Neck supple.  Cardiovascular:     Rate and Rhythm: Normal rate and regular rhythm.  Pulmonary:     Effort: Pulmonary effort is normal.     Breath sounds: Normal breath sounds.  Abdominal:     General: Bowel sounds are normal.     Palpations: Abdomen is soft.  Musculoskeletal: Normal range of motion.  Skin:    General: Skin is warm and dry.  Neurological:     Mental Status: She is alert and oriented to person, place, and time.  Psychiatric:        Behavior: Behavior normal.      ED Treatments / Results  Labs (all labs ordered are listed, but only abnormal results are displayed) Labs Reviewed  CBC WITH DIFFERENTIAL/PLATELET - Abnormal; Notable for the following components:      Result Value   Lymphs Abs 0.6 (*)    All other components within normal limits  COMPREHENSIVE METABOLIC PANEL - Abnormal; Notable for the following components:   Glucose, Bld 112 (*)    Calcium 8.8 (*)    All other components within normal limits  LIPASE, BLOOD    EKG EKG Interpretation  Date/Time:  Saturday October 26 2018 18:15:52 EST Ventricular Rate:  119 PR Interval:    QRS Duration: 80 QT Interval:  306 QTC Calculation: 431 R Axis:   129 Text Interpretation:  Right and left arm electrode reversal, interpretation assumes no reversal Sinus tachycardia Probable lateral infarct, age indeterminate Confirmed by Noemi Chapel [phone]) on 10/26/2018 6:21:10 PM   Radiology No results found.  Procedures Procedures (including critical care time)  Medications Ordered in ED Medications  ondansetron  (ZOFRAN) injection 4 mg (4 mg Intravenous Given 10/26/18 1923)  sodium chloride 0.9 % bolus 1,000 mL (0 mLs Intravenous Stopped 10/26/18 2027)     Initial Impression / Assessment and Plan / ED Course  I have reviewed the triage vital signs and the nursing notes.  Pertinent labs & imaging results that were available  during my care of the patient were reviewed by me and considered in my medical decision making (see chart for details).     Patient presents with nausea, vomiting, diarrhea.  Labs are reassuring.  She is slightly tachycardic which would go along with her dehydration.  She feels much better after IV fluids.  Final Clinical Impressions(s) / ED Diagnoses   Final diagnoses:  Gastroenteritis    ED Discharge Orders         Ordered    ondansetron (ZOFRAN) 4 MG tablet  Every 6 hours     10/26/18 2201           Nat Christen, MD 10/26/18 2204

## 2018-10-30 DIAGNOSIS — R112 Nausea with vomiting, unspecified: Secondary | ICD-10-CM | POA: Diagnosis not present

## 2018-10-30 DIAGNOSIS — I10 Essential (primary) hypertension: Secondary | ICD-10-CM | POA: Diagnosis not present

## 2018-12-02 DIAGNOSIS — E669 Obesity, unspecified: Secondary | ICD-10-CM | POA: Diagnosis not present

## 2018-12-02 DIAGNOSIS — Z0001 Encounter for general adult medical examination with abnormal findings: Secondary | ICD-10-CM | POA: Diagnosis not present

## 2018-12-02 DIAGNOSIS — E7800 Pure hypercholesterolemia, unspecified: Secondary | ICD-10-CM | POA: Diagnosis not present

## 2018-12-02 DIAGNOSIS — I10 Essential (primary) hypertension: Secondary | ICD-10-CM | POA: Diagnosis not present

## 2018-12-02 DIAGNOSIS — E785 Hyperlipidemia, unspecified: Secondary | ICD-10-CM | POA: Diagnosis not present

## 2018-12-09 DIAGNOSIS — R1011 Right upper quadrant pain: Secondary | ICD-10-CM | POA: Diagnosis not present

## 2018-12-09 DIAGNOSIS — I10 Essential (primary) hypertension: Secondary | ICD-10-CM | POA: Diagnosis not present

## 2018-12-09 DIAGNOSIS — M940 Chondrocostal junction syndrome [Tietze]: Secondary | ICD-10-CM | POA: Diagnosis not present

## 2018-12-09 DIAGNOSIS — R0781 Pleurodynia: Secondary | ICD-10-CM | POA: Diagnosis not present

## 2018-12-22 DIAGNOSIS — H524 Presbyopia: Secondary | ICD-10-CM | POA: Diagnosis not present

## 2019-01-06 ENCOUNTER — Other Ambulatory Visit (HOSPITAL_COMMUNITY): Payer: Self-pay | Admitting: Internal Medicine

## 2019-01-06 DIAGNOSIS — Z1231 Encounter for screening mammogram for malignant neoplasm of breast: Secondary | ICD-10-CM

## 2019-02-10 ENCOUNTER — Ambulatory Visit (HOSPITAL_COMMUNITY): Payer: Medicare Other

## 2019-03-24 ENCOUNTER — Ambulatory Visit (HOSPITAL_COMMUNITY): Payer: Medicare Other

## 2019-04-30 ENCOUNTER — Ambulatory Visit (HOSPITAL_COMMUNITY)
Admission: RE | Admit: 2019-04-30 | Discharge: 2019-04-30 | Disposition: A | Discharge status: Home / Self Care | Payer: Medicare Other | Source: Ambulatory Visit | Attending: Internal Medicine | Admitting: Internal Medicine

## 2019-04-30 ENCOUNTER — Other Ambulatory Visit: Payer: Self-pay

## 2019-04-30 ENCOUNTER — Encounter (HOSPITAL_COMMUNITY): Payer: Self-pay

## 2019-04-30 DIAGNOSIS — Z1231 Encounter for screening mammogram for malignant neoplasm of breast: Secondary | ICD-10-CM | POA: Insufficient documentation

## 2019-05-09 DIAGNOSIS — Z6831 Body mass index (BMI) 31.0-31.9, adult: Secondary | ICD-10-CM | POA: Diagnosis not present

## 2019-05-09 DIAGNOSIS — I10 Essential (primary) hypertension: Secondary | ICD-10-CM | POA: Diagnosis not present

## 2019-05-09 DIAGNOSIS — R7303 Prediabetes: Secondary | ICD-10-CM | POA: Diagnosis not present

## 2019-05-09 DIAGNOSIS — E785 Hyperlipidemia, unspecified: Secondary | ICD-10-CM | POA: Diagnosis not present

## 2019-06-09 DIAGNOSIS — E785 Hyperlipidemia, unspecified: Secondary | ICD-10-CM | POA: Diagnosis not present

## 2019-06-09 DIAGNOSIS — I10 Essential (primary) hypertension: Secondary | ICD-10-CM | POA: Diagnosis not present

## 2019-07-10 DIAGNOSIS — I10 Essential (primary) hypertension: Secondary | ICD-10-CM | POA: Diagnosis not present

## 2019-07-10 DIAGNOSIS — E785 Hyperlipidemia, unspecified: Secondary | ICD-10-CM | POA: Diagnosis not present

## 2019-07-18 DIAGNOSIS — Z20828 Contact with and (suspected) exposure to other viral communicable diseases: Secondary | ICD-10-CM | POA: Diagnosis not present

## 2019-07-18 DIAGNOSIS — R05 Cough: Secondary | ICD-10-CM | POA: Diagnosis not present

## 2019-08-09 DIAGNOSIS — E785 Hyperlipidemia, unspecified: Secondary | ICD-10-CM | POA: Diagnosis not present

## 2019-08-09 DIAGNOSIS — I10 Essential (primary) hypertension: Secondary | ICD-10-CM | POA: Diagnosis not present

## 2019-08-18 DIAGNOSIS — R05 Cough: Secondary | ICD-10-CM | POA: Diagnosis not present

## 2019-08-18 DIAGNOSIS — Z20828 Contact with and (suspected) exposure to other viral communicable diseases: Secondary | ICD-10-CM | POA: Diagnosis not present

## 2019-09-05 ENCOUNTER — Other Ambulatory Visit (HOSPITAL_COMMUNITY)
Admission: RE | Admit: 2019-09-05 | Discharge: 2019-09-05 | Disposition: A | Discharge status: Home / Self Care | Payer: Medicare Other | Source: Ambulatory Visit | Attending: Internal Medicine | Admitting: Internal Medicine

## 2019-09-05 ENCOUNTER — Other Ambulatory Visit: Payer: Self-pay

## 2019-09-05 DIAGNOSIS — R7303 Prediabetes: Secondary | ICD-10-CM | POA: Insufficient documentation

## 2019-09-05 DIAGNOSIS — I10 Essential (primary) hypertension: Secondary | ICD-10-CM | POA: Insufficient documentation

## 2019-09-05 DIAGNOSIS — E785 Hyperlipidemia, unspecified: Secondary | ICD-10-CM | POA: Insufficient documentation

## 2019-09-05 DIAGNOSIS — Z6831 Body mass index (BMI) 31.0-31.9, adult: Secondary | ICD-10-CM | POA: Insufficient documentation

## 2019-09-16 DIAGNOSIS — Z20828 Contact with and (suspected) exposure to other viral communicable diseases: Secondary | ICD-10-CM | POA: Diagnosis not present

## 2019-09-16 DIAGNOSIS — R05 Cough: Secondary | ICD-10-CM | POA: Diagnosis not present

## 2019-09-24 DIAGNOSIS — Z20828 Contact with and (suspected) exposure to other viral communicable diseases: Secondary | ICD-10-CM | POA: Diagnosis not present

## 2019-09-28 DIAGNOSIS — I10 Essential (primary) hypertension: Secondary | ICD-10-CM | POA: Diagnosis not present

## 2019-09-28 DIAGNOSIS — E785 Hyperlipidemia, unspecified: Secondary | ICD-10-CM | POA: Diagnosis not present

## 2019-09-30 DIAGNOSIS — Z20828 Contact with and (suspected) exposure to other viral communicable diseases: Secondary | ICD-10-CM | POA: Diagnosis not present

## 2019-10-09 DIAGNOSIS — Z20828 Contact with and (suspected) exposure to other viral communicable diseases: Secondary | ICD-10-CM | POA: Diagnosis not present

## 2019-10-16 DIAGNOSIS — Z20828 Contact with and (suspected) exposure to other viral communicable diseases: Secondary | ICD-10-CM | POA: Diagnosis not present

## 2019-10-16 DIAGNOSIS — R05 Cough: Secondary | ICD-10-CM | POA: Diagnosis not present

## 2019-10-21 DIAGNOSIS — M25552 Pain in left hip: Secondary | ICD-10-CM | POA: Diagnosis not present

## 2019-10-23 DIAGNOSIS — Z20828 Contact with and (suspected) exposure to other viral communicable diseases: Secondary | ICD-10-CM | POA: Diagnosis not present

## 2019-10-23 DIAGNOSIS — R05 Cough: Secondary | ICD-10-CM | POA: Diagnosis not present

## 2019-10-29 DIAGNOSIS — E785 Hyperlipidemia, unspecified: Secondary | ICD-10-CM | POA: Diagnosis not present

## 2019-10-29 DIAGNOSIS — I10 Essential (primary) hypertension: Secondary | ICD-10-CM | POA: Diagnosis not present

## 2019-10-30 DIAGNOSIS — Z20828 Contact with and (suspected) exposure to other viral communicable diseases: Secondary | ICD-10-CM | POA: Diagnosis not present

## 2019-10-30 DIAGNOSIS — R05 Cough: Secondary | ICD-10-CM | POA: Diagnosis not present

## 2019-11-06 DIAGNOSIS — R05 Cough: Secondary | ICD-10-CM | POA: Diagnosis not present

## 2019-11-06 DIAGNOSIS — Z20828 Contact with and (suspected) exposure to other viral communicable diseases: Secondary | ICD-10-CM | POA: Diagnosis not present

## 2019-11-13 DIAGNOSIS — R0982 Postnasal drip: Secondary | ICD-10-CM | POA: Diagnosis not present

## 2019-11-13 DIAGNOSIS — R05 Cough: Secondary | ICD-10-CM | POA: Diagnosis not present

## 2019-11-13 DIAGNOSIS — Z20828 Contact with and (suspected) exposure to other viral communicable diseases: Secondary | ICD-10-CM | POA: Diagnosis not present

## 2019-11-20 DIAGNOSIS — Z20828 Contact with and (suspected) exposure to other viral communicable diseases: Secondary | ICD-10-CM | POA: Diagnosis not present

## 2019-11-20 DIAGNOSIS — R05 Cough: Secondary | ICD-10-CM | POA: Diagnosis not present

## 2019-11-27 DIAGNOSIS — Z20828 Contact with and (suspected) exposure to other viral communicable diseases: Secondary | ICD-10-CM | POA: Diagnosis not present

## 2019-11-27 DIAGNOSIS — R05 Cough: Secondary | ICD-10-CM | POA: Diagnosis not present

## 2019-11-29 DIAGNOSIS — E785 Hyperlipidemia, unspecified: Secondary | ICD-10-CM | POA: Diagnosis not present

## 2019-11-29 DIAGNOSIS — I10 Essential (primary) hypertension: Secondary | ICD-10-CM | POA: Diagnosis not present

## 2019-12-04 DIAGNOSIS — Z20828 Contact with and (suspected) exposure to other viral communicable diseases: Secondary | ICD-10-CM | POA: Diagnosis not present

## 2019-12-10 DIAGNOSIS — R05 Cough: Secondary | ICD-10-CM | POA: Diagnosis not present

## 2019-12-10 DIAGNOSIS — J310 Chronic rhinitis: Secondary | ICD-10-CM | POA: Diagnosis not present

## 2019-12-10 DIAGNOSIS — Z20828 Contact with and (suspected) exposure to other viral communicable diseases: Secondary | ICD-10-CM | POA: Diagnosis not present

## 2019-12-18 DIAGNOSIS — Z20828 Contact with and (suspected) exposure to other viral communicable diseases: Secondary | ICD-10-CM | POA: Diagnosis not present

## 2019-12-18 DIAGNOSIS — J310 Chronic rhinitis: Secondary | ICD-10-CM | POA: Diagnosis not present

## 2019-12-18 DIAGNOSIS — R05 Cough: Secondary | ICD-10-CM | POA: Diagnosis not present

## 2019-12-25 DIAGNOSIS — Z20828 Contact with and (suspected) exposure to other viral communicable diseases: Secondary | ICD-10-CM | POA: Diagnosis not present

## 2019-12-25 DIAGNOSIS — J310 Chronic rhinitis: Secondary | ICD-10-CM | POA: Diagnosis not present

## 2019-12-25 DIAGNOSIS — R05 Cough: Secondary | ICD-10-CM | POA: Diagnosis not present

## 2019-12-27 DIAGNOSIS — E785 Hyperlipidemia, unspecified: Secondary | ICD-10-CM | POA: Diagnosis not present

## 2019-12-27 DIAGNOSIS — I10 Essential (primary) hypertension: Secondary | ICD-10-CM | POA: Diagnosis not present

## 2019-12-29 DIAGNOSIS — M25562 Pain in left knee: Secondary | ICD-10-CM | POA: Diagnosis not present

## 2020-01-01 DIAGNOSIS — R05 Cough: Secondary | ICD-10-CM | POA: Diagnosis not present

## 2020-01-01 DIAGNOSIS — Z20828 Contact with and (suspected) exposure to other viral communicable diseases: Secondary | ICD-10-CM | POA: Diagnosis not present

## 2020-01-01 DIAGNOSIS — M79609 Pain in unspecified limb: Secondary | ICD-10-CM | POA: Diagnosis not present

## 2020-01-08 DIAGNOSIS — R05 Cough: Secondary | ICD-10-CM | POA: Diagnosis not present

## 2020-01-08 DIAGNOSIS — Z20828 Contact with and (suspected) exposure to other viral communicable diseases: Secondary | ICD-10-CM | POA: Diagnosis not present

## 2020-01-15 DIAGNOSIS — Z20828 Contact with and (suspected) exposure to other viral communicable diseases: Secondary | ICD-10-CM | POA: Diagnosis not present

## 2020-01-15 DIAGNOSIS — R05 Cough: Secondary | ICD-10-CM | POA: Diagnosis not present

## 2020-01-15 DIAGNOSIS — M79609 Pain in unspecified limb: Secondary | ICD-10-CM | POA: Diagnosis not present

## 2020-01-22 DIAGNOSIS — Z20828 Contact with and (suspected) exposure to other viral communicable diseases: Secondary | ICD-10-CM | POA: Diagnosis not present

## 2020-01-27 DIAGNOSIS — I10 Essential (primary) hypertension: Secondary | ICD-10-CM | POA: Diagnosis not present

## 2020-01-27 DIAGNOSIS — E785 Hyperlipidemia, unspecified: Secondary | ICD-10-CM | POA: Diagnosis not present

## 2020-01-29 DIAGNOSIS — Z20828 Contact with and (suspected) exposure to other viral communicable diseases: Secondary | ICD-10-CM | POA: Diagnosis not present

## 2020-02-05 DIAGNOSIS — R05 Cough: Secondary | ICD-10-CM | POA: Diagnosis not present

## 2020-02-05 DIAGNOSIS — R0602 Shortness of breath: Secondary | ICD-10-CM | POA: Diagnosis not present

## 2020-02-05 DIAGNOSIS — Z20828 Contact with and (suspected) exposure to other viral communicable diseases: Secondary | ICD-10-CM | POA: Diagnosis not present

## 2020-02-12 DIAGNOSIS — Z20828 Contact with and (suspected) exposure to other viral communicable diseases: Secondary | ICD-10-CM | POA: Diagnosis not present

## 2020-02-26 DIAGNOSIS — Z20828 Contact with and (suspected) exposure to other viral communicable diseases: Secondary | ICD-10-CM | POA: Diagnosis not present

## 2020-02-26 DIAGNOSIS — R05 Cough: Secondary | ICD-10-CM | POA: Diagnosis not present

## 2020-03-03 ENCOUNTER — Other Ambulatory Visit (HOSPITAL_COMMUNITY): Payer: Self-pay | Admitting: Internal Medicine

## 2020-03-03 DIAGNOSIS — I10 Essential (primary) hypertension: Secondary | ICD-10-CM | POA: Diagnosis not present

## 2020-03-03 DIAGNOSIS — M1990 Unspecified osteoarthritis, unspecified site: Secondary | ICD-10-CM | POA: Diagnosis not present

## 2020-03-03 DIAGNOSIS — E785 Hyperlipidemia, unspecified: Secondary | ICD-10-CM | POA: Diagnosis not present

## 2020-03-03 DIAGNOSIS — Z1231 Encounter for screening mammogram for malignant neoplasm of breast: Secondary | ICD-10-CM

## 2020-03-25 DIAGNOSIS — H524 Presbyopia: Secondary | ICD-10-CM | POA: Diagnosis not present

## 2020-04-03 DIAGNOSIS — E785 Hyperlipidemia, unspecified: Secondary | ICD-10-CM | POA: Diagnosis not present

## 2020-04-03 DIAGNOSIS — I10 Essential (primary) hypertension: Secondary | ICD-10-CM | POA: Diagnosis not present

## 2020-04-14 DIAGNOSIS — R6889 Other general symptoms and signs: Secondary | ICD-10-CM | POA: Diagnosis not present

## 2020-05-03 ENCOUNTER — Other Ambulatory Visit: Payer: Self-pay

## 2020-05-03 ENCOUNTER — Ambulatory Visit (HOSPITAL_COMMUNITY)
Admission: RE | Admit: 2020-05-03 | Discharge: 2020-05-03 | Disposition: A | Discharge status: Home / Self Care | Payer: Medicare Other | Source: Ambulatory Visit | Attending: Internal Medicine | Admitting: Internal Medicine

## 2020-05-03 DIAGNOSIS — Z1231 Encounter for screening mammogram for malignant neoplasm of breast: Secondary | ICD-10-CM | POA: Diagnosis not present

## 2020-05-03 DIAGNOSIS — E785 Hyperlipidemia, unspecified: Secondary | ICD-10-CM | POA: Diagnosis not present

## 2020-05-03 DIAGNOSIS — I10 Essential (primary) hypertension: Secondary | ICD-10-CM | POA: Diagnosis not present

## 2020-06-03 DIAGNOSIS — E785 Hyperlipidemia, unspecified: Secondary | ICD-10-CM | POA: Diagnosis not present

## 2020-06-03 DIAGNOSIS — I10 Essential (primary) hypertension: Secondary | ICD-10-CM | POA: Diagnosis not present

## 2020-07-04 DIAGNOSIS — I10 Essential (primary) hypertension: Secondary | ICD-10-CM | POA: Diagnosis not present

## 2020-07-04 DIAGNOSIS — E785 Hyperlipidemia, unspecified: Secondary | ICD-10-CM | POA: Diagnosis not present

## 2020-07-29 DIAGNOSIS — Z20828 Contact with and (suspected) exposure to other viral communicable diseases: Secondary | ICD-10-CM | POA: Diagnosis not present

## 2020-08-03 DIAGNOSIS — I10 Essential (primary) hypertension: Secondary | ICD-10-CM | POA: Diagnosis not present

## 2020-08-03 DIAGNOSIS — E785 Hyperlipidemia, unspecified: Secondary | ICD-10-CM | POA: Diagnosis not present

## 2020-09-13 DIAGNOSIS — R7303 Prediabetes: Secondary | ICD-10-CM | POA: Diagnosis not present

## 2020-09-13 DIAGNOSIS — E785 Hyperlipidemia, unspecified: Secondary | ICD-10-CM | POA: Diagnosis not present

## 2020-09-13 DIAGNOSIS — E669 Obesity, unspecified: Secondary | ICD-10-CM | POA: Diagnosis not present

## 2020-10-17 DIAGNOSIS — E785 Hyperlipidemia, unspecified: Secondary | ICD-10-CM | POA: Diagnosis not present

## 2020-10-17 DIAGNOSIS — I10 Essential (primary) hypertension: Secondary | ICD-10-CM | POA: Diagnosis not present

## 2020-11-22 DIAGNOSIS — E785 Hyperlipidemia, unspecified: Secondary | ICD-10-CM | POA: Diagnosis not present

## 2020-11-22 DIAGNOSIS — I10 Essential (primary) hypertension: Secondary | ICD-10-CM | POA: Diagnosis not present

## 2020-12-20 DIAGNOSIS — I10 Essential (primary) hypertension: Secondary | ICD-10-CM | POA: Diagnosis not present

## 2020-12-20 DIAGNOSIS — E785 Hyperlipidemia, unspecified: Secondary | ICD-10-CM | POA: Diagnosis not present

## 2021-01-17 DIAGNOSIS — E785 Hyperlipidemia, unspecified: Secondary | ICD-10-CM | POA: Diagnosis not present

## 2021-01-17 DIAGNOSIS — I10 Essential (primary) hypertension: Secondary | ICD-10-CM | POA: Diagnosis not present

## 2021-02-17 DIAGNOSIS — E785 Hyperlipidemia, unspecified: Secondary | ICD-10-CM | POA: Diagnosis not present

## 2021-02-17 DIAGNOSIS — I10 Essential (primary) hypertension: Secondary | ICD-10-CM | POA: Diagnosis not present

## 2021-03-04 DIAGNOSIS — I10 Essential (primary) hypertension: Secondary | ICD-10-CM | POA: Diagnosis not present

## 2021-03-04 DIAGNOSIS — E559 Vitamin D deficiency, unspecified: Secondary | ICD-10-CM | POA: Diagnosis not present

## 2021-03-04 DIAGNOSIS — R7303 Prediabetes: Secondary | ICD-10-CM | POA: Diagnosis not present

## 2021-03-04 DIAGNOSIS — E785 Hyperlipidemia, unspecified: Secondary | ICD-10-CM | POA: Diagnosis not present

## 2021-03-28 DIAGNOSIS — H524 Presbyopia: Secondary | ICD-10-CM | POA: Diagnosis not present

## 2021-03-29 ENCOUNTER — Other Ambulatory Visit (HOSPITAL_COMMUNITY): Payer: Self-pay | Admitting: Family

## 2021-03-29 DIAGNOSIS — Z1231 Encounter for screening mammogram for malignant neoplasm of breast: Secondary | ICD-10-CM

## 2021-04-04 DIAGNOSIS — I10 Essential (primary) hypertension: Secondary | ICD-10-CM | POA: Diagnosis not present

## 2021-04-04 DIAGNOSIS — E785 Hyperlipidemia, unspecified: Secondary | ICD-10-CM | POA: Diagnosis not present

## 2021-05-04 DIAGNOSIS — E785 Hyperlipidemia, unspecified: Secondary | ICD-10-CM | POA: Diagnosis not present

## 2021-05-04 DIAGNOSIS — I10 Essential (primary) hypertension: Secondary | ICD-10-CM | POA: Diagnosis not present

## 2021-05-06 ENCOUNTER — Ambulatory Visit (HOSPITAL_COMMUNITY)
Admission: RE | Admit: 2021-05-06 | Discharge: 2021-05-06 | Disposition: A | Discharge status: Home / Self Care | Payer: Medicare Other | Source: Ambulatory Visit | Attending: Family | Admitting: Family

## 2021-05-06 ENCOUNTER — Other Ambulatory Visit: Payer: Self-pay

## 2021-05-06 ENCOUNTER — Ambulatory Visit (HOSPITAL_COMMUNITY): Payer: Medicare Other

## 2021-05-06 DIAGNOSIS — Z1231 Encounter for screening mammogram for malignant neoplasm of breast: Secondary | ICD-10-CM

## 2021-06-04 DIAGNOSIS — I10 Essential (primary) hypertension: Secondary | ICD-10-CM | POA: Diagnosis not present

## 2021-06-04 DIAGNOSIS — E785 Hyperlipidemia, unspecified: Secondary | ICD-10-CM | POA: Diagnosis not present

## 2021-07-05 DIAGNOSIS — I10 Essential (primary) hypertension: Secondary | ICD-10-CM | POA: Diagnosis not present

## 2021-07-05 DIAGNOSIS — E785 Hyperlipidemia, unspecified: Secondary | ICD-10-CM | POA: Diagnosis not present

## 2021-07-20 DIAGNOSIS — M25561 Pain in right knee: Secondary | ICD-10-CM | POA: Diagnosis not present

## 2021-08-15 DIAGNOSIS — M25561 Pain in right knee: Secondary | ICD-10-CM | POA: Diagnosis not present

## 2021-08-17 ENCOUNTER — Inpatient Hospital Stay (HOSPITAL_COMMUNITY)
Admission: EM | Admit: 2021-08-17 | Discharge: 2021-08-23 | DRG: 300 | Disposition: A | Discharge status: Skilled Nursing Facility | Payer: Medicare Other | Attending: Internal Medicine | Admitting: Internal Medicine

## 2021-08-17 ENCOUNTER — Other Ambulatory Visit: Payer: Self-pay

## 2021-08-17 ENCOUNTER — Encounter (HOSPITAL_COMMUNITY): Payer: Self-pay | Admitting: Emergency Medicine

## 2021-08-17 ENCOUNTER — Emergency Department (HOSPITAL_COMMUNITY): Payer: Medicare Other

## 2021-08-17 DIAGNOSIS — R262 Difficulty in walking, not elsewhere classified: Secondary | ICD-10-CM | POA: Diagnosis not present

## 2021-08-17 DIAGNOSIS — E7800 Pure hypercholesterolemia, unspecified: Secondary | ICD-10-CM | POA: Diagnosis present

## 2021-08-17 DIAGNOSIS — Z86010 Personal history of colonic polyps: Secondary | ICD-10-CM

## 2021-08-17 DIAGNOSIS — I82492 Acute embolism and thrombosis of other specified deep vein of left lower extremity: Principal | ICD-10-CM | POA: Diagnosis present

## 2021-08-17 DIAGNOSIS — R509 Fever, unspecified: Secondary | ICD-10-CM

## 2021-08-17 DIAGNOSIS — Z9841 Cataract extraction status, right eye: Secondary | ICD-10-CM | POA: Diagnosis not present

## 2021-08-17 DIAGNOSIS — Z79899 Other long term (current) drug therapy: Secondary | ICD-10-CM

## 2021-08-17 DIAGNOSIS — Z20822 Contact with and (suspected) exposure to covid-19: Secondary | ICD-10-CM | POA: Diagnosis present

## 2021-08-17 DIAGNOSIS — R600 Localized edema: Secondary | ICD-10-CM | POA: Diagnosis not present

## 2021-08-17 DIAGNOSIS — R9431 Abnormal electrocardiogram [ECG] [EKG]: Secondary | ICD-10-CM | POA: Diagnosis not present

## 2021-08-17 DIAGNOSIS — M1712 Unilateral primary osteoarthritis, left knee: Secondary | ICD-10-CM | POA: Diagnosis not present

## 2021-08-17 DIAGNOSIS — F32A Depression, unspecified: Secondary | ICD-10-CM | POA: Diagnosis present

## 2021-08-17 DIAGNOSIS — R000 Tachycardia, unspecified: Secondary | ICD-10-CM | POA: Diagnosis not present

## 2021-08-17 DIAGNOSIS — G9341 Metabolic encephalopathy: Secondary | ICD-10-CM | POA: Diagnosis not present

## 2021-08-17 DIAGNOSIS — M25562 Pain in left knee: Secondary | ICD-10-CM | POA: Diagnosis not present

## 2021-08-17 DIAGNOSIS — Z961 Presence of intraocular lens: Secondary | ICD-10-CM | POA: Diagnosis present

## 2021-08-17 DIAGNOSIS — Z781 Physical restraint status: Secondary | ICD-10-CM

## 2021-08-17 DIAGNOSIS — E042 Nontoxic multinodular goiter: Secondary | ICD-10-CM | POA: Diagnosis not present

## 2021-08-17 DIAGNOSIS — R634 Abnormal weight loss: Secondary | ICD-10-CM | POA: Diagnosis not present

## 2021-08-17 DIAGNOSIS — Z9842 Cataract extraction status, left eye: Secondary | ICD-10-CM

## 2021-08-17 DIAGNOSIS — I82409 Acute embolism and thrombosis of unspecified deep veins of unspecified lower extremity: Secondary | ICD-10-CM | POA: Diagnosis present

## 2021-08-17 DIAGNOSIS — M1711 Unilateral primary osteoarthritis, right knee: Secondary | ICD-10-CM | POA: Diagnosis present

## 2021-08-17 DIAGNOSIS — I10 Essential (primary) hypertension: Secondary | ICD-10-CM | POA: Diagnosis not present

## 2021-08-17 DIAGNOSIS — Z6839 Body mass index (BMI) 39.0-39.9, adult: Secondary | ICD-10-CM

## 2021-08-17 DIAGNOSIS — E876 Hypokalemia: Secondary | ICD-10-CM | POA: Diagnosis not present

## 2021-08-17 DIAGNOSIS — E785 Hyperlipidemia, unspecified: Secondary | ICD-10-CM | POA: Diagnosis not present

## 2021-08-17 DIAGNOSIS — G9340 Encephalopathy, unspecified: Secondary | ICD-10-CM | POA: Diagnosis present

## 2021-08-17 DIAGNOSIS — R52 Pain, unspecified: Secondary | ICD-10-CM | POA: Diagnosis present

## 2021-08-17 DIAGNOSIS — I82412 Acute embolism and thrombosis of left femoral vein: Secondary | ICD-10-CM | POA: Diagnosis not present

## 2021-08-17 DIAGNOSIS — Z90710 Acquired absence of both cervix and uterus: Secondary | ICD-10-CM | POA: Diagnosis not present

## 2021-08-17 DIAGNOSIS — M79652 Pain in left thigh: Secondary | ICD-10-CM | POA: Diagnosis not present

## 2021-08-17 DIAGNOSIS — G3184 Mild cognitive impairment, so stated: Secondary | ICD-10-CM | POA: Diagnosis not present

## 2021-08-17 DIAGNOSIS — M79662 Pain in left lower leg: Secondary | ICD-10-CM | POA: Diagnosis not present

## 2021-08-17 DIAGNOSIS — R5381 Other malaise: Secondary | ICD-10-CM | POA: Diagnosis not present

## 2021-08-17 LAB — CBC WITH DIFFERENTIAL/PLATELET
Abs Immature Granulocytes: 0.03 10*3/uL (ref 0.00–0.07)
Basophils Absolute: 0 10*3/uL (ref 0.0–0.1)
Basophils Relative: 0 %
Eosinophils Absolute: 0 10*3/uL (ref 0.0–0.5)
Eosinophils Relative: 0 %
HCT: 38.4 % (ref 36.0–46.0)
Hemoglobin: 12.4 g/dL (ref 12.0–15.0)
Immature Granulocytes: 1 %
Lymphocytes Relative: 9 %
Lymphs Abs: 0.6 10*3/uL — ABNORMAL LOW (ref 0.7–4.0)
MCH: 27.5 pg (ref 26.0–34.0)
MCHC: 32.3 g/dL (ref 30.0–36.0)
MCV: 85.1 fL (ref 80.0–100.0)
Monocytes Absolute: 0.7 10*3/uL (ref 0.1–1.0)
Monocytes Relative: 11 %
Neutro Abs: 5 10*3/uL (ref 1.7–7.7)
Neutrophils Relative %: 79 %
Platelets: 239 10*3/uL (ref 150–400)
RBC: 4.51 MIL/uL (ref 3.87–5.11)
RDW: 13.3 % (ref 11.5–15.5)
WBC: 6.3 10*3/uL (ref 4.0–10.5)
nRBC: 0 % (ref 0.0–0.2)

## 2021-08-17 LAB — C-REACTIVE PROTEIN: CRP: 22.8 mg/dL — ABNORMAL HIGH (ref <1.0)

## 2021-08-17 LAB — COMPREHENSIVE METABOLIC PANEL
ALT: 15 U/L (ref 0–44)
AST: 19 U/L (ref 15–41)
Albumin: 3.4 g/dL — ABNORMAL LOW (ref 3.5–5.0)
Alkaline Phosphatase: 58 U/L (ref 38–126)
Anion gap: 9 (ref 5–15)
BUN: 23 mg/dL (ref 8–23)
CO2: 28 mmol/L (ref 22–32)
Calcium: 9 mg/dL (ref 8.9–10.3)
Chloride: 98 mmol/L (ref 98–111)
Creatinine, Ser: 0.68 mg/dL (ref 0.44–1.00)
GFR, Estimated: >60 mL/min (ref >60)
Glucose, Bld: 152 mg/dL — ABNORMAL HIGH (ref 70–99)
Potassium: 3.1 mmol/L — ABNORMAL LOW (ref 3.5–5.1)
Sodium: 135 mmol/L (ref 135–145)
Total Bilirubin: 0.9 mg/dL (ref 0.3–1.2)
Total Protein: 7.3 g/dL (ref 6.5–8.1)

## 2021-08-17 LAB — RESP PANEL BY RT-PCR (FLU A&B, COVID) ARPGX2
Influenza A by PCR: NEGATIVE
Influenza B by PCR: NEGATIVE
SARS Coronavirus 2 by RT PCR: NEGATIVE

## 2021-08-17 LAB — SEDIMENTATION RATE: Sed Rate: 65 mm/hr — ABNORMAL HIGH (ref 0–22)

## 2021-08-17 LAB — MAGNESIUM: Magnesium: 2.1 mg/dL (ref 1.7–2.4)

## 2021-08-17 LAB — LACTIC ACID, PLASMA: Lactic Acid, Venous: 1.3 mmol/L (ref 0.5–1.9)

## 2021-08-17 LAB — HEPARIN LEVEL (UNFRACTIONATED): Heparin Unfractionated: <0.1 IU/mL — ABNORMAL LOW (ref 0.30–0.70)

## 2021-08-17 MED ORDER — OXYCODONE HCL 5 MG PO TABS
5.0000 mg | ORAL_TABLET | ORAL | Status: DC | PRN
  Administered 2021-08-17: 5 mg via ORAL
  Administered 2021-08-17 – 2021-08-18 (×2): 10 mg via ORAL
  Administered 2021-08-18: 5 mg via ORAL
  Filled 2021-08-17: qty 2
  Filled 2021-08-17: qty 1
  Filled 2021-08-17: qty 2
  Filled 2021-08-17: qty 1

## 2021-08-17 MED ORDER — METOPROLOL TARTRATE 25 MG PO TABS
25.0000 mg | ORAL_TABLET | Freq: Two times a day (BID) | ORAL | Status: DC
  Administered 2021-08-17 – 2021-08-23 (×12): 25 mg via ORAL
  Filled 2021-08-17 (×12): qty 1

## 2021-08-17 MED ORDER — POTASSIUM CHLORIDE CRYS ER 20 MEQ PO TBCR
40.0000 meq | EXTENDED_RELEASE_TABLET | ORAL | Status: AC
  Administered 2021-08-17 (×2): 40 meq via ORAL
  Filled 2021-08-17 (×2): qty 2

## 2021-08-17 MED ORDER — MORPHINE SULFATE (PF) 4 MG/ML IV SOLN
4.0000 mg | Freq: Once | INTRAVENOUS | Status: AC
  Administered 2021-08-17: 4 mg via INTRAVENOUS
  Filled 2021-08-17: qty 1

## 2021-08-17 MED ORDER — PRAVASTATIN SODIUM 10 MG PO TABS
20.0000 mg | ORAL_TABLET | Freq: Every day | ORAL | Status: DC
  Administered 2021-08-17 – 2021-08-22 (×6): 20 mg via ORAL
  Filled 2021-08-17 (×6): qty 2

## 2021-08-17 MED ORDER — HEPARIN BOLUS VIA INFUSION
4500.0000 [IU] | Freq: Once | INTRAVENOUS | Status: AC
  Administered 2021-08-17: 4500 [IU] via INTRAVENOUS

## 2021-08-17 MED ORDER — ACETAMINOPHEN 650 MG RE SUPP: 650.0000 mg | Freq: Four times a day (QID) | RECTAL | Status: DC | PRN

## 2021-08-17 MED ORDER — SODIUM CHLORIDE 0.9 % IV BOLUS
1000.0000 mL | Freq: Once | INTRAVENOUS | Status: AC
  Administered 2021-08-17: 1000 mL via INTRAVENOUS

## 2021-08-17 MED ORDER — MORPHINE SULFATE (PF) 2 MG/ML IV SOLN
2.0000 mg | Freq: Once | INTRAVENOUS | Status: AC
Start: 2021-08-17 — End: 2021-08-17
  Administered 2021-08-17: 2 mg via INTRAVENOUS
  Filled 2021-08-17: qty 1

## 2021-08-17 MED ORDER — ONDANSETRON HCL 4 MG PO TABS: 4.0000 mg | ORAL_TABLET | Freq: Four times a day (QID) | ORAL | Status: DC | PRN

## 2021-08-17 MED ORDER — HEPARIN BOLUS VIA INFUSION
2000.0000 [IU] | Freq: Once | INTRAVENOUS | Status: AC
  Administered 2021-08-17: 2000 [IU] via INTRAVENOUS

## 2021-08-17 MED ORDER — ONDANSETRON HCL 4 MG/2ML IJ SOLN: 4.0000 mg | Freq: Four times a day (QID) | INTRAMUSCULAR | Status: DC | PRN

## 2021-08-17 MED ORDER — HEPARIN (PORCINE) 25000 UT/250ML-% IV SOLN
1800.0000 [IU]/h | INTRAVENOUS | Status: DC
  Administered 2021-08-17: 1200 [IU]/h via INTRAVENOUS
  Administered 2021-08-18: 1450 [IU]/h via INTRAVENOUS
  Filled 2021-08-17 (×2): qty 250

## 2021-08-17 MED ORDER — IOHEXOL 350 MG/ML SOLN
100.0000 mL | Freq: Once | INTRAVENOUS | Status: AC | PRN
  Administered 2021-08-17: 80 mL via INTRAVENOUS

## 2021-08-17 MED ORDER — ACETAMINOPHEN 325 MG PO TABS
650.0000 mg | ORAL_TABLET | Freq: Four times a day (QID) | ORAL | Status: DC | PRN
  Administered 2021-08-18 – 2021-08-23 (×6): 650 mg via ORAL
  Filled 2021-08-17 (×6): qty 2

## 2021-08-17 NOTE — Progress Notes (Signed)
ANTICOAGULATION CONSULT NOTE - Initial Consult  Pharmacy Consult for heparin Indication: DVT  No Known Allergies  Patient Measurements: Updated patient weight: 75 kg Heparin Dosing Weight:   Vital Signs: Temp: 99.3 F (37.4 C) (10/26 0908) Temp Source: Oral (10/26 0908) BP: 114/87 (10/26 1138) Pulse Rate: 122 (10/26 1138)  Labs: Recent Labs    08/17/21 0932  HGB 12.4  HCT 38.4  PLT 239  CREATININE 0.68     Estimated Creatinine Clearance: 60.6 mL/min (by C-G formula based on SCr of 0.68 mg/dL).   Medical History: Past Medical History:  Diagnosis Date   Arthritis    Depression    HTN (hypertension)    Hypercholesteremia    S/P colonoscopy 2009   adenoma, repeat 5 years    Medications:  (Not in a hospital admission)  Assessment: Pharmacy consulted to dose heparin in patient with left lower extremity DVT. Patient is not on anticoagulation prior to admission.  Goal of Therapy:  Heparin level 0.3-0.7 units/ml Monitor platelets by anticoagulation protocol: Yes   Plan:  Give 4500 units bolus x 1 Start heparin infusion at 1200 units/hr Check anti-Xa level in 8 hours and daily. Continue to monitor H&H and platelets.    Margot Ables, PharmD Clinical Pharmacist 08/17/2021 1:32 PM

## 2021-08-17 NOTE — Progress Notes (Signed)
ANTICOAGULATION CONSULT NOTE  Pharmacy Consult for heparin Indication: DVT  No Known Allergies  Patient Measurements: Ht: 69 in Wt: 75 kg  Vital Signs: BP: 119/62 (10/26 2300) Pulse Rate: 99 (10/26 2300)  Labs: Recent Labs    08/17/21 0932 08/17/21 2201  HGB 12.4  --   HCT 38.4  --   PLT 239  --   HEPARINUNFRC  --  <0.10*  CREATININE 0.68  --      Estimated Creatinine Clearance: 60.6 mL/min (by C-G formula based on SCr of 0.68 mg/dL).   Medical History: Past Medical History:  Diagnosis Date   Arthritis    Depression    HTN (hypertension)    Hypercholesteremia    S/P colonoscopy 2009   adenoma, repeat 5 years    Medications:  (Not in a hospital admission)  Assessment: Pharmacy consulted to dose heparin in patient with left lower extremity DVT. Patient is not on anticoagulation prior to admission.  Heparin level undetectable on gtt at 1200 units/hr. No issues with line or bleeding reported per RN.  Goal of Therapy:  Heparin level 0.3-0.7 units/ml Monitor platelets by anticoagulation protocol: Yes   Plan:  Rebolus heparin 2000 units Increase heparin infusion to 1450 units/hr Check anti-Xa level in 8 hours  Sherlon Handing, PharmD, BCPS Please see amion for complete clinical pharmacist phone list 08/17/2021 11:16 PM

## 2021-08-17 NOTE — ED Provider Notes (Signed)
Who presents [name]   CSN: [id number] Arrival date & time: 08/17/21  1540     History Chief Complaint  Patient presents with  . Knee Pain    [name] is a 80 y.o. female who presents to the emergency department for further evaluation of left knee pain that began last night.  Patient does not recall any trauma to the knee or specific injury.  Left knee pain has been constant worsening.  She rates her knee pain severe in severity.  She reports associated knee swelling that extends down to the lower leg.  She denies any fever, chills, chest pain, shortness of breath, history of gout, history of rheumatological disease.  She does endorse a history of osteoarthritis in the right knee.   Knee Pain     Past Medical History:  Diagnosis Date  . Arthritis   . Depression   . HTN (hypertension)   . Hypercholesteremia   . S/P colonoscopy 2009   adenoma, repeat 5 years    Patient Active Problem List   Diagnosis Date Noted  . DVT (deep venous thrombosis) ([place]) 08/17/2021  . Adenomatous colon polyp 03/18/2013    Past Surgical History:  Procedure Laterality Date  . CATARACT EXTRACTION W/PHACO Right 04/14/2014   Procedure: CATARACT EXTRACTION PHACO AND INTRAOCULAR LENS PLACEMENT (IOC) CDE=6.22;  Surgeon: Elta Guadeloupe T. Gershon Crane, MD;  Location: [facility];  Service: Ophthalmology;  Laterality: Right;  . CATARACT EXTRACTION W/PHACO Left 06/16/2014   Procedure: CATARACT EXTRACTION PHACO AND INTRAOCULAR LENS PLACEMENT (IOC);  Surgeon: Elta Guadeloupe T. Gershon Crane, MD;  Location: [facility];  Service: Ophthalmology;  Laterality: Left;  CDE:8.01  . COLONOSCOPY  02/17/2005   GQQ:PYPPJK polyp and right colon polyp, status post snare polypectomy/Otherwise normal rectum and colon  . COLONOSCOPY  02/18/2008   RMR: Normal rectum/Polyp in the hepatic flexure removed with snare cecal polyps were cold biopsied/removed; remainder of the colonic mucosa appeared normal. Tubluar adenoma  .  COLONOSCOPY N/A 03/26/2013   Procedure: COLONOSCOPY;  Surgeon: Daneil Dolin, MD;  Location: [facility];  Service: Endoscopy;  Laterality: N/A;  1:15  . COLONOSCOPY N/A 06/05/2018   Procedure: COLONOSCOPY;  Surgeon: Daneil Dolin, MD;  Location: [facility];  Service: Endoscopy;  Laterality: N/A;  10:30  . TOTAL ABDOMINAL HYSTERECTOMY     fibroid tumors  . UTERINE FIBROID SURGERY       OB History     Gravida  3   Para  3   Term  3   Preterm      AB      Living  3      SAB      IAB      Ectopic      Multiple      Live Births              Family History  Problem Relation Age of Onset  . Stroke Brother   . Stroke Mother        deceased  . Emphysema Father        deceased  . Colon cancer Neg Hx     Social History   Tobacco Use  . Smoking status: Never  . Smokeless tobacco: Never  Vaping Use  . Vaping Use: Never used  Substance Use Topics  . Alcohol use: No  . Drug use: No    Home Medications Prior to Admission medications   Medication Sig Start Date End Date Taking? Authorizing  Provider  acetaminophen (TYLENOL) 325 MG tablet Take 650 mg by mouth every 6 (six) hours as needed for mild pain or moderate pain.   Yes [provider]  cetirizine (ZYRTEC) 10 MG tablet Take 10 mg by mouth daily.   Yes [provider]  diphenhydramine-acetaminophen (TYLENOL PM) 25-500 MG TABS tablet Take 1 tablet by mouth at bedtime as needed (for sleep).   Yes [provider]  hydrochlorothiazide 25 MG tablet Take 25 mg by mouth daily.     Yes [provider]  lisinopril (PRINIVIL,ZESTRIL) 5 MG tablet Take 5 mg by mouth daily.     Yes [provider]  lovastatin (MEVACOR) 20 MG tablet Take 20 mg by mouth at bedtime.     Yes [provider]  Vitamin D, Ergocalciferol, (DRISDOL) 1.25 MG (50000 UNIT) CAPS capsule Take 50,000 Units by mouth once a week. 07/01/21  Yes [provider]  ondansetron (ZOFRAN) 4 MG  tablet Take 1 tablet (4 mg total) by mouth every 6 (six) hours. Patient not taking: No sig reported 10/26/18   Nat Christen, MD    Allergies    Patient has no known allergies.  Review of Systems   Review of Systems  All other systems reviewed and are negative.  Physical Exam Updated Vital Signs BP 114/87 (BP Location: Right Arm)   Pulse (!) 122   Temp 99.3 F (37.4 C) (Oral)   Resp (!) 24   Ht '5\' 9"'  (1.753 m)   Wt 121.1 kg   SpO2 100%   BMI 39.43 kg/m   Physical Exam Vitals and nursing [name] reviewed.  Constitutional:      General: She is not in acute distress.    Appearance: Normal appearance.  HENT:     Head: Normocephalic and atraumatic.  Eyes:     General:        Right eye: No discharge.        Left eye: No discharge.  Cardiovascular:     Comments: Regular rate and rhythm.  S1/S2 are distinct without any evidence of murmur, rubs, or gallops.  Radial pulses are 2+ bilaterally.  Dorsalis pedis pulses are 2+ bilaterally.  No evidence of pedal edema. Pulmonary:     Comments: Clear to auscultation bilaterally.  Normal effort.  No respiratory distress.  No evidence of wheezes, rales, or rhonchi heard throughout. Abdominal:     General: Abdomen is flat. Bowel sounds are normal. There is no distension.     Tenderness: There is no abdominal tenderness. There is no guarding or rebound.  Musculoskeletal:        General: Normal range of motion.     Cervical back: Neck supple.     Comments: Left knee is tender to palpation with associated swelling. The left leg is also swollen without and pitting edema. 2+ dorsalis pedis pulse on the left.   Skin:    General: Skin is warm and dry.     Findings: No rash.  Neurological:     General: No focal deficit present.     Mental Status: She is alert.  Psychiatric:        Mood and Affect: Mood normal.        Behavior: Behavior normal.    ED Results / Procedures / Treatments   Labs (all labs ordered are listed, but only abnormal  results are displayed) Labs Reviewed  CBC WITH DIFFERENTIAL/PLATELET - Abnormal; Notable for the following components:      Result Value  Lymphs Abs 0.6 (*)    All other components within normal limits  COMPREHENSIVE METABOLIC PANEL - Abnormal; Notable for the following components:   Potassium 3.1 (*)    Glucose, Bld 152 (*)    Albumin 3.4 (*)    All other components within normal limits  SEDIMENTATION RATE - Abnormal; Notable for the following components:   Sed Rate 65 (*)    All other components within normal limits  C-REACTIVE PROTEIN - Abnormal; Notable for the following components:   CRP 22.8 (*)    All other components within normal limits  RESP PANEL BY RT-PCR (FLU A&B, COVID) [id number]  LACTIC ACID, PLASMA    EKG EKG Interpretation  Date/Time:  Wednesday August 17 2021 09:08:55 EDT Ventricular Rate:  121 PR Interval:  152 QRS Duration: 88 QT Interval:  305 QTC Calculation: 433 R Axis:   44 Text Interpretation: Sinus tachycardia with irregular rate Sinus tachycardia Confirmed by Lavenia Atlas [phone]) on 08/17/2021 9:19:10 AM  Radiology CT Angio Chest PE W and/or Wo Contrast  Result Date: 08/17/2021 CLINICAL DATA:  Positive right DVT with tachycardia EXAM: CT ANGIOGRAPHY CHEST WITH CONTRAST TECHNIQUE: Multidetector CT imaging of the chest was performed using the standard protocol during bolus administration of intravenous contrast. Multiplanar CT image reconstructions and MIPs were obtained to evaluate the vascular anatomy. CONTRAST:  78m OMNIPAQUE IOHEXOL 350 MG/ML SOLN COMPARISON:  None. FINDINGS: Cardiovascular: Satisfactory opacification of the pulmonary arteries to the segmental level. No evidence of pulmonary embolism. Pulmonary trunk measures 3.7 cm in diameter. Thoracic aorta is nonaneurysmal. Atherosclerotic calcifications of the aorta and coronary arteries. Heart size is upper limits of normal. No pericardial effusion. Mediastinum/Nodes: No axillary, mediastinal,  or hilar lymphadenopathy. Multinodular thyroid gland with dominant nodule in the left thyroid lobe measuring at least 3.1 cm. Trachea and esophagus within normal limits. Lungs/Pleura: Lungs are clear. No pleural effusion or pneumothorax. Upper Abdomen: No acute abnormality. Musculoskeletal: Multiple areas of increased sclerosis within the sternum and manubrium. The posterior wall of the manubrium is ill-defined (series 8, images 75-78). No additional bony lesions are identified. No fracture. Multilevel degenerative changes within the thoracic spine. No chest wall abnormality. Review of the MIP images confirms the above findings. IMPRESSION: 1. No evidence of acute pulmonary embolism. 2. Multiple areas of increased sclerosis within the sternum and manubrium. The posterior wall of the manubrium is ill-defined. Findings are suspicious for metastatic disease versus myeloma. MRI of the sternum without and with intravenous contrast is recommended for further evaluation. Nucleolar medicine bone scan could also be performed to assess for additional lesions. 3. Multinodular thyroid gland with dominant nodule in the left thyroid lobe measuring at least 3.1 cm. Recommend nonemergent thyroid [place](ref: J Am Coll Radiol. 2015 Feb;12(2): 143-50). 4. Aortic and coronary artery atherosclerosis (ICD10-I70.0). Electronically Signed   By: NDavina PokeD.O.   On: 08/17/2021 11:36   [place] Img Lower Unilateral Left (DVT)  Result Date: 08/17/2021 CLINICAL DATA:  Left thigh and knee pain and edema. EXAM: LEFT LOWER EXTREMITY VENOUS DOPPLER ULTRASOUND TECHNIQUE: Gray-scale sonography with graded compression, as well as color Doppler and duplex ultrasound were performed to evaluate the lower extremity deep venous systems from the level of the common femoral vein and including the common femoral, femoral, profunda femoral, popliteal and calf veins including the posterior tibial, peroneal and gastrocnemius veins when visible. The  superficial great saphenous vein was also interrogated. Spectral Doppler was utilized to evaluate flow at rest and with distal augmentation maneuvers  in the common femoral, femoral and popliteal veins. COMPARISON:  None. FINDINGS: Contralateral Common Femoral Vein: Respiratory phasicity is normal and symmetric with the symptomatic side. No evidence of thrombus. Normal compressibility. Common Femoral Vein: No evidence of thrombus. Normal compressibility, respiratory phasicity and response to augmentation. Saphenofemoral Junction: No evidence of thrombus. Normal compressibility and flow on color Doppler imaging. Profunda Femoral Vein: No evidence of thrombus. Normal compressibility and flow on color Doppler imaging. Femoral Vein: Nonocclusive thrombus is seen throughout the course of the femoral vein in the left thigh. Popliteal Vein: No evidence of thrombus. Normal compressibility, respiratory phasicity and response to augmentation. Calf Veins: No thrombus identified in the visualized posterior tibial or peroneal veins. There is thrombus seen in a gastrocnemius/soleal vein. Superficial Great Saphenous Vein: No evidence of thrombus. Normal compressibility. Venous Reflux:  None. Other Findings: No evidence of superficial thrombophlebitis or abnormal fluid collection. IMPRESSION: Positive for left lower extremity DVT with nonocclusive thrombus seen throughout the left femoral vein and thrombus identified in a gastrocnemius/soleal vein. Electronically Signed   By: Aletta Edouard M.D.   On: 08/17/2021 10:22   DG Knee Complete 4 Views Left  Result Date: 08/17/2021 CLINICAL DATA:  Reactive EXAM: LEFT KNEE - COMPLETE 4+ VIEW COMPARISON:  None. FINDINGS: No recent fracture is seen. There is no significant effusion in the suprapatellar bursa. Marked degenerative changes are noted in radial lateral patellofemoral compartments, more so in the patellofemoral compartment osteopenia is seen in both hips. There are no opaque  foreign bodies. IMPRESSION: No recent fracture is seen. Marked degenerative changes are noted, more so in the patellofemoral compartment. Osteopenia. Reading location: [place], [place]. Electronically Signed   By: Elmer Picker   On: 08/17/2021 10:57    Procedures Procedures   Medications Ordered in ED Medications  morphine 4 MG/ML injection 4 mg (has no administration in time range)  sodium chloride 0.9 % bolus 1,000 mL (1,000 mLs Intravenous New Bag/Given 08/17/21 0936)  iohexol (OMNIPAQUE) 350 MG/ML injection 100 mL (80 mLs Intravenous Contrast Given 08/17/21 1045)  morphine 2 MG/ML injection 2 mg (2 mg Intravenous Given 08/17/21 1156)    ED Course  I have reviewed the triage vital signs and the nursing notes.  Pertinent labs & imaging results that were available during my care of the patient were reviewed by me and considered in my medical decision making (see chart for details).  Clinical Course as of 08/17/21 1729  Wed Aug 17, 2021  1244 Spoke with Dr. Roderic Palau the hospitalist who agrees to admit the patient. I will start the patient on Heparin per his request.  [CF]    Clinical Course User Index [CF] Cherrie Gauze   MDM Rules/Calculators/A&P                          [name] is a 80 y.o. female emergency department for further evaluation of left knee pain.  History and physical exam is concerning for possible infectious etiology versus underlying deep vein thrombosis.  This could also be possible rheumatological in nature.  CBC without any leukocytosis and CMP was otherwise normal.  Lactic acid was negative.  Low suspicion for septic joint at this time.  ESR and CRP were mildly elevated.  X-ray of the knee was negative for any fracture or dislocation.  Ultrasound of the lower extremity revealed significant deep vein thrombosis with proximal clot burden.  CT angio was without any evidence of pulmonary embolism  but did show possible metastatic disease versus  multiple myeloma.  Given that the patient does not have any significant signs of anemia or hypercalcemia I feel like multiple myeloma is less likely at this time.  Given the clinical scenario, patient lives alone and has been not able to ambulate secondary to pain.  I believe she would benefit from further evaluation in the hospital.  We will start the patient on heparin and given morphine for pain control.  She will be admitted to the hospital service for further evaluation.  She is otherwise stable.  Final Clinical Impression(s) / ED Diagnoses Final diagnoses:  Acute deep vein thrombosis (DVT) of other specified vein of left lower extremity [facility])    Rx / DC Orders ED Discharge Orders     None        Hendricks Limes, PA-C 08/17/21 1256    Lorelle Gibbs, DO 08/17/21 1527

## 2021-08-17 NOTE — ED Triage Notes (Signed)
Pt arrived VIA [facility]. Pt c/o chronic left knee pain that has became worse over the last week. Per EMS pt is also tachycardic.

## 2021-08-17 NOTE — Progress Notes (Signed)
ANTICOAGULATION CONSULT NOTE - Initial Consult  Pharmacy Consult for heparin Indication: DVT  No Known Allergies  Patient Measurements: Height: 5\' 9"  (175.3 cm) Weight: 121.1 kg (267 lb) IBW/kg (Calculated) : 66.2 Heparin Dosing Weight: 94 kg  Vital Signs: Temp: 99.3 F (37.4 C) (10/26 0908) Temp Source: Oral (10/26 0908) BP: 114/87 (10/26 1138) Pulse Rate: 122 (10/26 1138)  Labs: Recent Labs    08/17/21 0932  HGB 12.4  HCT 38.4  PLT 239  CREATININE 0.68    Estimated Creatinine Clearance: 80.7 mL/min (by C-G formula based on SCr of 0.68 mg/dL).   Medical History: Past Medical History:  Diagnosis Date   Arthritis    Depression    HTN (hypertension)    Hypercholesteremia    S/P colonoscopy 2009   adenoma, repeat 5 years    Medications:  (Not in a hospital admission)   Assessment: Pharmacy consulted to dose heparin in patient with left lower extremity DVT. Patient is not on anticoagulation prior to admission.  Goal of Therapy:  Heparin level 0.3-0.7 units/ml Monitor platelets by anticoagulation protocol: Yes   Plan:  Give 5000 units bolus x 1 Start heparin infusion at 1500 units/hr Check anti-Xa level in 8 hours and daily while on heparin Continue to monitor H&H and platelets  Margot Ables, PharmD Clinical Pharmacist 08/17/2021 1:10 PM

## 2021-08-17 NOTE — H&P (Signed)
History and Physical    CARAMIA BOUTIN WUJ:[id number] DOB: 1941/03/22 DOA: 08/17/2021  PCP: Joyice Faster, FNP  Patient coming from: home  I have personally briefly reviewed patient's old medical records in [place]  Chief Complaint: left leg pain  HPI: [name] is a 80 y.o. female with medical history significant of hypertension, hyperlipidemia, was complaining of left lower extremity pain and swelling for the past week.  She reports that yesterday her symptoms got substantially worse to the point that she was unable to walk or bear weight on her leg.  Denies any preceding trauma.  She has not been sedentary/immobile prior to onset of symptoms.  She has not been started on any new medications.  Reports that her appetite has been good.  She has noticed weight loss of approximately 14 pounds in the past year.  She denies any chest pain or shortness of breath.  ED Course: She was evaluated the emergency room her x-ray of her left knee did not show any acute bony abnormalities.  Venous Dopplers did not show underlying DVT, mainly in the left femoral vein.  CT angiogram of the chest was negative for pulmonary embolism.  Review of Systems: As per HPI otherwise 10 point review of systems negative.    Past Medical History:  Diagnosis Date   Arthritis    Depression    HTN (hypertension)    Hypercholesteremia    S/P colonoscopy 2009   adenoma, repeat 5 years    Past Surgical History:  Procedure Laterality Date   CATARACT EXTRACTION W/PHACO Right 04/14/2014   Procedure: CATARACT EXTRACTION PHACO AND INTRAOCULAR LENS PLACEMENT ([place]) CDE=6.22;  Surgeon: Elta Guadeloupe T. Gershon Crane, MD;  Location: [facility];  Service: Ophthalmology;  Laterality: Right;   CATARACT EXTRACTION W/PHACO Left 06/16/2014   Procedure: CATARACT EXTRACTION PHACO AND INTRAOCULAR LENS PLACEMENT ([facility]);  Surgeon: Elta Guadeloupe T. Gershon Crane, MD;  Location: [facility];  Service: Ophthalmology;  Laterality: Left;  CDE:8.01   COLONOSCOPY   02/17/2005   NFA:OZHYQM polyp and right colon polyp, status post snare polypectomy/Otherwise normal rectum and colon   COLONOSCOPY  02/18/2008   RMR: Normal rectum/Polyp in the hepatic flexure removed with snare cecal polyps were cold biopsied/removed; remainder of the colonic mucosa appeared normal. Tubluar adenoma   COLONOSCOPY N/A 03/26/2013   Procedure: COLONOSCOPY;  Surgeon: Daneil Dolin, MD;  Location: [facility];  Service: Endoscopy;  Laterality: N/A;  1:15   COLONOSCOPY N/A 06/05/2018   Procedure: COLONOSCOPY;  Surgeon: Daneil Dolin, MD;  Location: [facility];  Service: Endoscopy;  Laterality: N/A;  10:30   TOTAL ABDOMINAL HYSTERECTOMY     fibroid tumors   UTERINE FIBROID SURGERY      Social History:  reports that she has never smoked. She has never used smokeless tobacco. She reports that she does not drink alcohol and does not use drugs.  No Known Allergies  Family History  Problem Relation Age of Onset   Stroke Brother    Stroke Mother        deceased   Emphysema Father        deceased   Colon cancer Neg Hx    Family history: patient reports mother may have had a history of VTE  Prior to Admission medications   Medication Sig Start Date End Date Taking? Authorizing Provider  acetaminophen (TYLENOL) 325 MG tablet Take 650 mg by mouth every 6 (six) hours as needed for mild pain or moderate pain.   Yes  [provider]  cetirizine (ZYRTEC) 10 MG tablet Take 10 mg by mouth daily.   Yes [provider]  diphenhydramine-acetaminophen (TYLENOL PM) 25-500 MG TABS tablet Take 1 tablet by mouth at bedtime as needed (for sleep).   Yes [provider]  hydrochlorothiazide 25 MG tablet Take 25 mg by mouth daily.     Yes [provider]  lisinopril (PRINIVIL,ZESTRIL) 5 MG tablet Take 5 mg by mouth daily.     Yes [provider]  lovastatin (MEVACOR) 20 MG tablet Take 20 mg by mouth at bedtime.     Yes [provider]   Vitamin D, Ergocalciferol, (DRISDOL) 1.25 MG (50000 UNIT) CAPS capsule Take 50,000 Units by mouth once a week. 07/01/21  Yes [provider]  ondansetron (ZOFRAN) 4 MG tablet Take 1 tablet (4 mg total) by mouth every 6 (six) hours. Patient not taking: No sig reported 10/26/18   Nat Christen, MD    Physical Exam: Vitals:   08/17/21 0908 08/17/21 1138 08/17/21 1330 08/17/21 1430  BP:  114/87  112/65  Pulse:  (!) 122  (!) 121  Resp:  (!) 24  (!) 21  Temp: 99.3 F (37.4 C)     TempSrc: Oral     SpO2:  100%  97%  Weight:   75.2 kg   Height:        Constitutional: NAD, calm, comfortable Eyes: PERRL, lids and conjunctivae normal ENMT: Mucous membranes are moist. Posterior pharynx clear of any exudate or lesions.Normal dentition.  Neck: normal, supple, no masses, no thyromegaly Respiratory: clear to auscultation bilaterally, no wheezing, no crackles. Normal respiratory effort. No accessory muscle use.  Cardiovascular: tachycardic, no murmurs / rubs / gallops.  2+ pedal pulses. No carotid bruits.  Abdomen: no tenderness, no masses palpated. No hepatosplenomegaly. Bowel sounds positive.  Musculoskeletal: no clubbing / cyanosis. No joint deformity upper and lower extremities. LLE more edematous with warmth, tenderness and decreased range of motion of left knee Skin: no rashes, lesions, ulcers. No induration Neurologic: CN 2-12 grossly intact. Sensation intact, DTR normal. Strength 5/5 in all 4.  Psychiatric: Normal judgment and insight. Alert and oriented x 3. Normal mood.       Labs on Admission: I have personally reviewed following labs and imaging studies  CBC: Recent Labs  Lab 08/17/21 0932  WBC 6.3  NEUTROABS 5.0  HGB 12.4  HCT 38.4  MCV 85.1  PLT 160   Basic Metabolic Panel: Recent Labs  Lab 08/17/21 0932  NA 135  K 3.1*  CL 98  CO2 28  GLUCOSE 152*  BUN 23  CREATININE 0.68  CALCIUM 9.0   GFR: Estimated Creatinine Clearance: 60.6 mL/min (by C-G formula  based on SCr of 0.68 mg/dL). Liver Function Tests: Recent Labs  Lab 08/17/21 0932  AST 19  ALT 15  ALKPHOS 58  BILITOT 0.9  PROT 7.3  ALBUMIN 3.4*   No results for input(s): LIPASE, AMYLASE in the last 168 hours. No results for input(s): AMMONIA in the last 168 hours. Coagulation Profile: No results for input(s): INR, PROTIME in the last 168 hours. Cardiac Enzymes: No results for input(s): CKTOTAL, CKMB, CKMBINDEX, TROPONINI in the last 168 hours. BNP (last 3 results) No results for input(s): PROBNP in the last 8760 hours. HbA1C: No results for input(s): HGBA1C in the last 72 hours. CBG: No results for input(s): GLUCAP in the last 168 hours. Lipid Profile: No results for input(s): CHOL, HDL, LDLCALC, TRIG, CHOLHDL, LDLDIRECT in the last  72 hours. Thyroid Function Tests: No results for input(s): TSH, T4TOTAL, FREET4, T3FREE, THYROIDAB in the last 72 hours. Anemia Panel: No results for input(s): VITAMINB12, FOLATE, FERRITIN, TIBC, IRON, RETICCTPCT in the last 72 hours. Urine analysis:    Component Value Date/Time   COLORURINE YELLOW 06/05/2013 1002   APPEARANCEUR CLEAR 06/05/2013 1002   LABSPEC >1.030 (H) 06/05/2013 1002   PHURINE 5.5 06/05/2013 1002   GLUCOSEU NEGATIVE 06/05/2013 1002   HGBUR TRACE (A) 06/05/2013 1002   BILIRUBINUR SMALL (A) 06/05/2013 1002   KETONESUR NEGATIVE 06/05/2013 1002   PROTEINUR NEGATIVE 06/05/2013 1002   UROBILINOGEN 2.0 (H) 06/05/2013 1002   NITRITE NEGATIVE 06/05/2013 1002   LEUKOCYTESUR MODERATE (A) 06/05/2013 1002    Radiological Exams on Admission: CT Angio Chest PE W and/or Wo Contrast  Result Date: 08/17/2021 CLINICAL DATA:  Positive right DVT with tachycardia EXAM: CT ANGIOGRAPHY CHEST WITH CONTRAST TECHNIQUE: Multidetector CT imaging of the chest was performed using the standard protocol during bolus administration of intravenous contrast. Multiplanar CT image reconstructions and MIPs were obtained to evaluate the vascular  anatomy. CONTRAST:  56mL OMNIPAQUE IOHEXOL 350 MG/ML SOLN COMPARISON:  None. FINDINGS: Cardiovascular: Satisfactory opacification of the pulmonary arteries to the segmental level. No evidence of pulmonary embolism. Pulmonary trunk measures 3.7 cm in diameter. Thoracic aorta is nonaneurysmal. Atherosclerotic calcifications of the aorta and coronary arteries. Heart size is upper limits of normal. No pericardial effusion. Mediastinum/Nodes: No axillary, mediastinal, or hilar lymphadenopathy. Multinodular thyroid gland with dominant nodule in the left thyroid lobe measuring at least 3.1 cm. Trachea and esophagus within normal limits. Lungs/Pleura: Lungs are clear. No pleural effusion or pneumothorax. Upper Abdomen: No acute abnormality. Musculoskeletal: Multiple areas of increased sclerosis within the sternum and manubrium. The posterior wall of the manubrium is ill-defined (series 8, images 75-78). No additional bony lesions are identified. No fracture. Multilevel degenerative changes within the thoracic spine. No chest wall abnormality. Review of the MIP images confirms the above findings. IMPRESSION: 1. No evidence of acute pulmonary embolism. 2. Multiple areas of increased sclerosis within the sternum and manubrium. The posterior wall of the manubrium is ill-defined. Findings are suspicious for metastatic disease versus myeloma. MRI of the sternum without and with intravenous contrast is recommended for further evaluation. Nucleolar medicine bone scan could also be performed to assess for additional lesions. 3. Multinodular thyroid gland with dominant nodule in the left thyroid lobe measuring at least 3.1 cm. Recommend nonemergent thyroid US (ref: J Am Coll Radiol. 2015 Feb;12(2): 143-50). 4. Aortic and coronary artery atherosclerosis (ICD10-I70.0). Electronically Signed   By: Davina Poke D.O.   On: 08/17/2021 11:36   US Venous Img Lower Unilateral Left (DVT)  Result Date: 08/17/2021 CLINICAL DATA:  Left  thigh and knee pain and edema. EXAM: LEFT LOWER EXTREMITY VENOUS DOPPLER ULTRASOUND TECHNIQUE: Gray-scale sonography with graded compression, as well as color Doppler and duplex ultrasound were performed to evaluate the lower extremity deep venous systems from the level of the common femoral vein and including the common femoral, femoral, profunda femoral, popliteal and calf veins including the posterior tibial, peroneal and gastrocnemius veins when visible. The superficial great saphenous vein was also interrogated. Spectral Doppler was utilized to evaluate flow at rest and with distal augmentation maneuvers in the common femoral, femoral and popliteal veins. COMPARISON:  None. FINDINGS: Contralateral Common Femoral Vein: Respiratory phasicity is normal and symmetric with the symptomatic side. No evidence of thrombus. Normal compressibility. Common Femoral Vein: No evidence of thrombus. Normal compressibility, respiratory phasicity  and response to augmentation. Saphenofemoral Junction: No evidence of thrombus. Normal compressibility and flow on color Doppler imaging. Profunda Femoral Vein: No evidence of thrombus. Normal compressibility and flow on color Doppler imaging. Femoral Vein: Nonocclusive thrombus is seen throughout the course of the femoral vein in the left thigh. Popliteal Vein: No evidence of thrombus. Normal compressibility, respiratory phasicity and response to augmentation. Calf Veins: No thrombus identified in the visualized posterior tibial or peroneal veins. There is thrombus seen in a gastrocnemius/soleal vein. Superficial Great Saphenous Vein: No evidence of thrombus. Normal compressibility. Venous Reflux:  None. Other Findings: No evidence of superficial thrombophlebitis or abnormal fluid collection. IMPRESSION: Positive for left lower extremity DVT with nonocclusive thrombus seen throughout the left femoral vein and thrombus identified in a gastrocnemius/soleal vein. Electronically Signed    By: Aletta Edouard M.D.   On: 08/17/2021 10:22   DG Knee Complete 4 Views Left  Result Date: 08/17/2021 CLINICAL DATA:  Reactive EXAM: LEFT KNEE - COMPLETE 4+ VIEW COMPARISON:  None. FINDINGS: No recent fracture is seen. There is no significant effusion in the suprapatellar bursa. Marked degenerative changes are noted in radial lateral patellofemoral compartments, more so in the patellofemoral compartment osteopenia is seen in both hips. There are no opaque foreign bodies. IMPRESSION: No recent fracture is seen. Marked degenerative changes are noted, more so in the patellofemoral compartment. Osteopenia. Reading location: [place], [place]. Electronically Signed   By: Elmer Picker   On: 08/17/2021 10:57    EKG: Independently reviewed. Sinus tachycardia  Assessment/Plan Active Problems:   DVT (deep venous thrombosis) (HCC)   Hypokalemia   HTN (hypertension)   Hyperlipidemia     Left lower extremity DVT -No obvious cause, although if she does have metastatic disease and this would be a precipitating factor -Currently on IV heparin, plan to transition to oral anticoagulation once her overall pain has improved -We will need at least 3 to 6 months of therapy -No evidence of PE on CT chest  Sclerotic lesions and sternum -We will need further work-up as an outpatient -We will discuss with oncology and likely set up outpatient follow-up  Hypertension -Holding hydrochlorothiazide/lisinopril for now  Sinus tachycardia -Suspect related to underlying DVT -We will start on low-dose beta-blocker  Hypokalemia -Possibly related to HCTZ -Replace and check magnesium  Hyperlipidemia -Continue statin  Difficulty with ambulation -Secondary to pain from DVT -Continue pain management -PT eval  DVT prophylaxis: heparin infusion  Code Status: full code  Family Communication: discussed with grand daughter at bedside  Disposition Plan: pending physical therapy evaluation  Consults  called:   Admission status: observation, tele   Kathie Dike MD [facility]   If 7PM-7AM, please contact night-coverage [email or web address]   08/17/2021, 4:37 PM

## 2021-08-18 ENCOUNTER — Inpatient Hospital Stay (HOSPITAL_COMMUNITY): Payer: Medicare Other

## 2021-08-18 DIAGNOSIS — Z86010 Personal history of colonic polyps: Secondary | ICD-10-CM | POA: Diagnosis not present

## 2021-08-18 DIAGNOSIS — E7800 Pure hypercholesterolemia, unspecified: Secondary | ICD-10-CM | POA: Diagnosis present

## 2021-08-18 DIAGNOSIS — Z6839 Body mass index (BMI) 39.0-39.9, adult: Secondary | ICD-10-CM | POA: Diagnosis not present

## 2021-08-18 DIAGNOSIS — G9341 Metabolic encephalopathy: Secondary | ICD-10-CM

## 2021-08-18 DIAGNOSIS — R52 Pain, unspecified: Secondary | ICD-10-CM | POA: Diagnosis present

## 2021-08-18 DIAGNOSIS — R509 Fever, unspecified: Secondary | ICD-10-CM | POA: Diagnosis not present

## 2021-08-18 DIAGNOSIS — Z9842 Cataract extraction status, left eye: Secondary | ICD-10-CM | POA: Diagnosis not present

## 2021-08-18 DIAGNOSIS — G9340 Encephalopathy, unspecified: Secondary | ICD-10-CM | POA: Diagnosis present

## 2021-08-18 DIAGNOSIS — M1711 Unilateral primary osteoarthritis, right knee: Secondary | ICD-10-CM | POA: Diagnosis present

## 2021-08-18 DIAGNOSIS — Z90710 Acquired absence of both cervix and uterus: Secondary | ICD-10-CM | POA: Diagnosis not present

## 2021-08-18 DIAGNOSIS — R634 Abnormal weight loss: Secondary | ICD-10-CM | POA: Diagnosis present

## 2021-08-18 DIAGNOSIS — I10 Essential (primary) hypertension: Secondary | ICD-10-CM | POA: Diagnosis not present

## 2021-08-18 DIAGNOSIS — Z9841 Cataract extraction status, right eye: Secondary | ICD-10-CM | POA: Diagnosis not present

## 2021-08-18 DIAGNOSIS — Z79899 Other long term (current) drug therapy: Secondary | ICD-10-CM | POA: Diagnosis not present

## 2021-08-18 DIAGNOSIS — G3184 Mild cognitive impairment, so stated: Secondary | ICD-10-CM | POA: Diagnosis present

## 2021-08-18 DIAGNOSIS — I82412 Acute embolism and thrombosis of left femoral vein: Secondary | ICD-10-CM | POA: Diagnosis present

## 2021-08-18 DIAGNOSIS — Z961 Presence of intraocular lens: Secondary | ICD-10-CM | POA: Diagnosis present

## 2021-08-18 DIAGNOSIS — E876 Hypokalemia: Secondary | ICD-10-CM | POA: Diagnosis present

## 2021-08-18 DIAGNOSIS — F32A Depression, unspecified: Secondary | ICD-10-CM | POA: Diagnosis present

## 2021-08-18 DIAGNOSIS — Z781 Physical restraint status: Secondary | ICD-10-CM | POA: Diagnosis not present

## 2021-08-18 DIAGNOSIS — I82492 Acute embolism and thrombosis of other specified deep vein of left lower extremity: Secondary | ICD-10-CM | POA: Diagnosis not present

## 2021-08-18 DIAGNOSIS — E785 Hyperlipidemia, unspecified: Secondary | ICD-10-CM | POA: Diagnosis not present

## 2021-08-18 DIAGNOSIS — Z20822 Contact with and (suspected) exposure to covid-19: Secondary | ICD-10-CM | POA: Diagnosis present

## 2021-08-18 DIAGNOSIS — R9431 Abnormal electrocardiogram [ECG] [EKG]: Secondary | ICD-10-CM | POA: Diagnosis not present

## 2021-08-18 LAB — BASIC METABOLIC PANEL
Anion gap: 9 (ref 5–15)
BUN: 18 mg/dL (ref 8–23)
CO2: 24 mmol/L (ref 22–32)
Calcium: 8.6 mg/dL — ABNORMAL LOW (ref 8.9–10.3)
Chloride: 101 mmol/L (ref 98–111)
Creatinine, Ser: 0.56 mg/dL (ref 0.44–1.00)
GFR, Estimated: >60 mL/min (ref >60)
Glucose, Bld: 112 mg/dL — ABNORMAL HIGH (ref 70–99)
Potassium: 3.9 mmol/L (ref 3.5–5.1)
Sodium: 134 mmol/L — ABNORMAL LOW (ref 135–145)

## 2021-08-18 LAB — URINALYSIS, ROUTINE W REFLEX MICROSCOPIC
Bilirubin Urine: NEGATIVE
Glucose, UA: 50 mg/dL — AB
Hgb urine dipstick: NEGATIVE
Ketones, ur: 20 mg/dL — AB
Nitrite: NEGATIVE
Protein, ur: 30 mg/dL — AB
Specific Gravity, Urine: >1.046 — ABNORMAL HIGH (ref 1.005–1.030)
pH: 5 (ref 5.0–8.0)

## 2021-08-18 LAB — CBC
HCT: 37.6 % (ref 36.0–46.0)
Hemoglobin: 11.6 g/dL — ABNORMAL LOW (ref 12.0–15.0)
MCH: 27.6 pg (ref 26.0–34.0)
MCHC: 30.9 g/dL (ref 30.0–36.0)
MCV: 89.3 fL (ref 80.0–100.0)
Platelets: 208 10*3/uL (ref 150–400)
RBC: 4.21 MIL/uL (ref 3.87–5.11)
RDW: 13.5 % (ref 11.5–15.5)
WBC: 6.3 10*3/uL (ref 4.0–10.5)
nRBC: 0 % (ref 0.0–0.2)

## 2021-08-18 LAB — HEPARIN LEVEL (UNFRACTIONATED)
Heparin Unfractionated: 0.19 IU/mL — ABNORMAL LOW (ref 0.30–0.70)
Heparin Unfractionated: <0.1 IU/mL — ABNORMAL LOW (ref 0.30–0.70)

## 2021-08-18 MED ORDER — ENOXAPARIN SODIUM 80 MG/0.8ML IJ SOSY
80.0000 mg | PREFILLED_SYRINGE | Freq: Two times a day (BID) | INTRAMUSCULAR | Status: DC
  Administered 2021-08-18 – 2021-08-21 (×6): 80 mg via SUBCUTANEOUS
  Filled 2021-08-18 (×6): qty 0.8

## 2021-08-18 MED ORDER — LORAZEPAM 2 MG/ML IJ SOLN
1.0000 mg | Freq: Four times a day (QID) | INTRAMUSCULAR | Status: DC | PRN
  Administered 2021-08-20 (×2): 1 mg via INTRAVENOUS
  Filled 2021-08-18 (×4): qty 1

## 2021-08-18 MED ORDER — SODIUM CHLORIDE 0.9 % IV SOLN
1.0000 g | INTRAVENOUS | Status: DC
  Administered 2021-08-18 – 2021-08-20 (×3): 1 g via INTRAVENOUS
  Filled 2021-08-18 (×3): qty 10

## 2021-08-18 MED ORDER — HEPARIN BOLUS VIA INFUSION
2000.0000 [IU] | Freq: Once | INTRAVENOUS | Status: AC
  Administered 2021-08-18: 2000 [IU] via INTRAVENOUS
  Filled 2021-08-18: qty 2000

## 2021-08-18 NOTE — Progress Notes (Signed)
Pt was alert and oriented this am during morning care, she did express some urgency to urinate. Pt attempted to get to bedside commode with x 2 assist and was unable to bare weight on either leg. Pt assisted back to bed with pure wick in place. MD advised that cognition and behavior has changed around lunch time, pt is confused and agitated wanting to get up and get out, UA C&S obtained per order. Holding PRN oxy at this time until confusion resolves.

## 2021-08-18 NOTE — TOC Initial Note (Signed)
Transition of Care [facility]) - Initial/Assessment Note    Patient Details  Name: [name] MRN: [id number] Date of Birth: May 13, 1941  [facility]) CM/SW Contact:    Ihor Gully, LCSW Phone Number: 08/18/2021, 4:18 PM  Clinical Narrative:                 Patient from home alone. Independent at baseline (ambulates independently, drives). Goes to [facility] daily. Discussed PT recommendations with granddaughter. Agreeable to SNF. Discussed choices. Referred to SNF. Granddaughter to speak with patient's son and daughter regarding placement. Patient is vaccinated and boostered for COVID and has had the flu vaccination.   Expected Discharge Plan: Skilled Nursing Facility Barriers to Discharge: Continued Medical Work up   Patient Goals and CMS Choice Patient states their goals for this hospitalization and ongoing recovery are:: rehab then home      Expected Discharge Plan and Services Expected Discharge Plan: [place] Acute Care Choice: [place] Living arrangements for the past 2 months: Apartment                                      Prior Living Arrangements/Services Living arrangements for the past 2 months: Apartment Lives with:: Self Patient language and need for interpreter reviewed:: Yes Do you feel safe going back to the place where you live?: Yes      Need for Family Participation in Patient Care: Yes (Comment) Care giver support system in place?: Yes (comment)   Criminal Activity/Legal Involvement Pertinent to Current Situation/Hospitalization: No - Comment as needed  Activities of Daily Living Home Assistive Devices/Equipment: None ADL Screening (condition at time of admission) Patient's cognitive ability adequate to safely complete daily activities?: Yes Is the patient deaf or have difficulty hearing?: No Does the patient have difficulty seeing, even when wearing glasses/contacts?: No Does  the patient have difficulty concentrating, remembering, or making decisions?: Yes Patient able to express need for assistance with ADLs?: Yes Does the patient have difficulty dressing or bathing?: Yes Independently performs ADLs?: No Dressing (OT): Needs assistance Is this a change from baseline?: Pre-admission baseline Grooming: Needs assistance Is this a change from baseline?: Pre-admission baseline Feeding: Independent Is this a change from baseline?: Pre-admission baseline Bathing: Needs assistance Is this a change from baseline?: Pre-admission baseline Toileting: Needs assistance Is this a change from baseline?: Pre-admission baseline In/Out Bed: Needs assistance Is this a change from baseline?: Pre-admission baseline Walks in Home: Independent Does the patient have difficulty walking or climbing stairs?: Yes Weakness of Legs: Left Weakness of Arms/Hands: None  Permission Sought/Granted Permission sought to share information with : Family Supports    Share Information with NAME: Louie Boston, granddaughter           Emotional Assessment       Orientation: : Oriented to Self, Oriented to Place, Oriented to  Time, Oriented to Situation Alcohol / Substance Use: Not Applicable Psych Involvement: No (comment)  Admission diagnosis:  Pain [R52] DVT (deep venous thrombosis) (HCC) [I82.409] Acute deep vein thrombosis (DVT) of other specified vein of left lower extremity ([place]) [I82.492] Patient Active Problem List   Diagnosis Date Noted   DVT (deep venous thrombosis) ([place]) 08/17/2021   Hypokalemia 08/17/2021   HTN (hypertension) 08/17/2021   Hyperlipidemia 08/17/2021   Adenomatous colon polyp 03/18/2013   PCP:  Joyice Faster, FNP Pharmacy:  [place], [place] - [address] [place] [address] Phone: [phone] Fax: [phone]  [facility] [address] [place] - [address] [place] [address] Phone: [phone] Fax: [phone]     Social Determinants of Health (SDOH) Interventions    Readmission Risk Interventions No flowsheet data found.

## 2021-08-18 NOTE — Progress Notes (Signed)
PROGRESS NOTE    [name]  HMC:[id number] DOB: September 23, 1941 DOA: 08/17/2021 PCP: Joyice Faster, FNP    Brief Narrative:  80 year old female with a history of hypertension and hyperlipidemia, admitted to the hospital with left lower extremity pain and swelling.  Found to have acute DVT.  Started on anticoagulation.  Having difficulty walking due to significant pain and swelling.  Seen by physical therapy with recommendations for skilled nursing facility.  During hospital course, she did become acutely confused.  She is noted to have mild fever.  Work-up for infectious process/altered mental status underway.   Assessment & Plan:   Active Problems:   DVT (deep venous thrombosis) (HCC)   Hypokalemia   HTN (hypertension)   Hyperlipidemia   Acute DVT left lower extremity -No obvious cause, although if she does have metastatic disease and this would be a precipitating factor -Currently on IV heparin, but heparin levels noted to be subtherapeutic through the day -We will transition to therapeutic Lovenox -We will need at least 3 to 6 months of therapy -No evidence of PE on CT chest -Reviewed case with Dr. Carlis Abbott on-call for vascular surgery.  Thrombectomy not recommended in this case.  It was felt that she would do reasonably well with anticoagulation -Keep lower extremity elevated  Sinus tachycardia -Noted to have persistent tachycardia on admission -Heart rate remained tachycardic despite improvement of pain in her leg. -Started on beta-blocker with some improvement of heart rate -Check echocardiogram and TSH  Sclerotic lesions in the sternum -Noted on CT chest -Discussed with Dr. Delton Coombes, oncology who felt it was reasonable to have this worked up as an outpatient and follow-up  Hypertension -Holding HCTZ and lisinopril for now -Blood pressure currently normotensive  Hyperlipidemia -Continue statin  Difficulty with ambulation -Seen by PT with recommendations for  skilled nursing facility  Acute encephalopathy -Etiology is unclear -Discussed with patient's granddaughter, she reports that patient is normally functional at baseline, continues to drive and pays her own bills -She does note that patient has occasionally been forgetful lately, at times asking if other family members have seen her husband who has passed away -She may have some underlying mild cognitive impairment -Today, she is certainly more agitated and confused and is not at her baseline.  She was pulling out her urinary catheter, was agitated and had to be put in wrist restraints -Checking UA/urine culture -Unclear if this is related to pain medication she has been receiving -Continue to monitor for now  Fever -Noted to have low-grade fever today of 100.3 -Checking UA/urine culture -We will empirically start on Rocephin -Check chest x-ray -Fever may also be related to DVT  DVT prophylaxis:   Therapeutic Lovenox  Code Status: Full code Family Communication: Updated patient's granddaughter over the phone 10/27 Disposition Plan: Status is: Inpatient  Remains inpatient appropriate because: Patient has altered mental status, fever and continued pain of her left lower extremity         Consultants:    Procedures:    Antimicrobials:  Ceftriaxone 10/27 >   Subjective: Complains of continued pain and swelling in her left lower extremity  Objective: Vitals:   08/18/21 0910 08/18/21 1100 08/18/21 1129 08/18/21 1512  BP: 129/68  132/75 127/79  Pulse: (!) 119 90 90 100  Resp: 18 18 18 18   Temp: 99.6 F (37.6 C) 99.8 F (37.7 C) 99.8 F (37.7 C) (!) 100.5 F (38.1 C)  TempSrc: Oral Oral Oral Oral  SpO2: 100% 100% 100%  Weight:      Height:        Intake/Output Summary (Last 24 hours) at 08/18/2021 1901 Last data filed at 08/18/2021 1700 Gross per 24 hour  Intake 668.56 ml  Output --  Net 668.56 ml   Filed Weights   08/17/21 0903 08/17/21 1330 08/18/21  0157  Weight: 121.1 kg 75.2 kg 83.7 kg    Examination:  General exam: Appears calm and comfortable  Respiratory system: Clear to auscultation. Respiratory effort normal. Cardiovascular system: S1 & S2 heard, RRR. No JVD, murmurs, rubs, gallops or clicks. No pedal edema. Gastrointestinal system: Abdomen is nondistended, soft and nontender. No organomegaly or masses felt. Normal bowel sounds heard. Central nervous system: Alert and oriented. No focal neurological deficits. Extremities: Continues to have pain, swelling and warmth in her left lower extremity Skin: No rashes, lesions or ulcers Psychiatry: Judgement and insight appear normal. Mood & affect appropriate.     Data Reviewed: I have personally reviewed following labs and imaging studies  CBC: Recent Labs  Lab 08/17/21 0932 08/18/21 0459  WBC 6.3 6.3  NEUTROABS 5.0  --   HGB 12.4 11.6*  HCT 38.4 37.6  MCV 85.1 89.3  PLT 239 277   Basic Metabolic Panel: Recent Labs  Lab 08/17/21 0932 08/18/21 0459  NA 135 134*  K 3.1* 3.9  CL 98 101  CO2 28 24  GLUCOSE 152* 112*  BUN 23 18  CREATININE 0.68 0.56  CALCIUM 9.0 8.6*  MG 2.1  --    GFR: Estimated Creatinine Clearance: 67 mL/min (by C-G formula based on SCr of 0.56 mg/dL). Liver Function Tests: Recent Labs  Lab 08/17/21 0932  AST 19  ALT 15  ALKPHOS 58  BILITOT 0.9  PROT 7.3  ALBUMIN 3.4*   No results for input(s): LIPASE, AMYLASE in the last 168 hours. No results for input(s): AMMONIA in the last 168 hours. Coagulation Profile: No results for input(s): INR, PROTIME in the last 168 hours. Cardiac Enzymes: No results for input(s): CKTOTAL, CKMB, CKMBINDEX, TROPONINI in the last 168 hours. BNP (last 3 results) No results for input(s): PROBNP in the last 8760 hours. HbA1C: No results for input(s): HGBA1C in the last 72 hours. CBG: No results for input(s): GLUCAP in the last 168 hours. Lipid Profile: No results for input(s): CHOL, HDL, LDLCALC, TRIG,  CHOLHDL, LDLDIRECT in the last 72 hours. Thyroid Function Tests: No results for input(s): TSH, T4TOTAL, FREET4, T3FREE, THYROIDAB in the last 72 hours. Anemia Panel: No results for input(s): VITAMINB12, FOLATE, FERRITIN, TIBC, IRON, RETICCTPCT in the last 72 hours. Sepsis Labs: Recent Labs  Lab 08/17/21 0933  LATICACIDVEN 1.3    Recent Results (from the past 240 hour(s))  Resp Panel by RT-PCR (Flu A&B, Covid) Nasopharyngeal Swab     Status: None   Collection Time: 08/17/21  1:17 PM   Specimen: Nasopharyngeal Swab; Nasopharyngeal(NP) swabs in vial transport medium  Result Value Ref Range Status   SARS Coronavirus 2 by RT PCR NEGATIVE NEGATIVE Final    Comment: (NOTE) SARS-CoV-2 target nucleic acids are NOT DETECTED.  The SARS-CoV-2 RNA is generally detectable in upper respiratory specimens during the acute phase of infection. The lowest concentration of SARS-CoV-2 viral copies this assay can detect is 138 copies/mL. A negative result does not preclude SARS-Cov-2 infection and should not be used as the sole basis for treatment or other patient management decisions. A negative result may occur with  improper specimen collection/handling, submission of specimen other than nasopharyngeal swab, presence  of viral mutation(s) within the areas targeted by this assay, and inadequate number of viral copies(<138 copies/mL). A negative result must be combined with clinical observations, patient history, and epidemiological information. The expected result is Negative.  Fact Sheet for Patients:  EntrepreneurPulse.com.au  Fact Sheet for Healthcare Providers:  IncredibleEmployment.be  This test is no t yet approved or cleared by the [place] FDA and  has been authorized for detection and/or diagnosis of SARS-CoV-2 by FDA under an Emergency Use Authorization (EUA). This EUA will remain  in effect (meaning this test can be used) for the duration of  the COVID-19 declaration under Section 564(b)(1) of the Act, 21 U.S.C.section 360bbb-3(b)(1), unless the authorization is terminated  or revoked sooner.       Influenza A by PCR NEGATIVE NEGATIVE Final   Influenza B by PCR NEGATIVE NEGATIVE Final    Comment: (NOTE) The Xpert Xpress SARS-CoV-2/FLU/RSV plus assay is intended as an aid in the diagnosis of influenza from Nasopharyngeal swab specimens and should not be used as a sole basis for treatment. Nasal washings and aspirates are unacceptable for Xpert Xpress SARS-CoV-2/FLU/RSV testing.  Fact Sheet for Patients: EntrepreneurPulse.com.au  Fact Sheet for Healthcare Providers: IncredibleEmployment.be  This test is not yet approved or cleared by the [place] FDA and has been authorized for detection and/or diagnosis of SARS-CoV-2 by FDA under an Emergency Use Authorization (EUA). This EUA will remain in effect (meaning this test can be used) for the duration of the COVID-19 declaration under Section 564(b)(1) of the Act, 21 U.S.C. section 360bbb-3(b)(1), unless the authorization is terminated or revoked.  Performed at [facility], [address] [place], [place] [address]          Radiology Studies: CT Angio Chest PE W and/or Wo Contrast  Result Date: 08/17/2021 CLINICAL DATA:  Positive right DVT with tachycardia EXAM: CT ANGIOGRAPHY CHEST WITH CONTRAST TECHNIQUE: Multidetector CT imaging of the chest was performed using the standard protocol during bolus administration of intravenous contrast. Multiplanar CT image reconstructions and MIPs were obtained to evaluate the vascular anatomy. CONTRAST:  88mL OMNIPAQUE IOHEXOL 350 MG/ML SOLN COMPARISON:  None. FINDINGS: Cardiovascular: Satisfactory opacification of the pulmonary arteries to the segmental level. No evidence of pulmonary embolism. Pulmonary trunk measures 3.7 cm in diameter. Thoracic aorta is nonaneurysmal. Atherosclerotic  calcifications of the aorta and coronary arteries. Heart size is upper limits of normal. No pericardial effusion. Mediastinum/Nodes: No axillary, mediastinal, or hilar lymphadenopathy. Multinodular thyroid gland with dominant nodule in the left thyroid lobe measuring at least 3.1 cm. Trachea and esophagus within normal limits. Lungs/Pleura: Lungs are clear. No pleural effusion or pneumothorax. Upper Abdomen: No acute abnormality. Musculoskeletal: Multiple areas of increased sclerosis within the sternum and manubrium. The posterior wall of the manubrium is ill-defined (series 8, images 75-78). No additional bony lesions are identified. No fracture. Multilevel degenerative changes within the thoracic spine. No chest wall abnormality. Review of the MIP images confirms the above findings. IMPRESSION: 1. No evidence of acute pulmonary embolism. 2. Multiple areas of increased sclerosis within the sternum and manubrium. The posterior wall of the manubrium is ill-defined. Findings are suspicious for metastatic disease versus myeloma. MRI of the sternum without and with intravenous contrast is recommended for further evaluation. Nucleolar medicine bone scan could also be performed to assess for additional lesions. 3. Multinodular thyroid gland with dominant nodule in the left thyroid lobe measuring at least 3.1 cm. Recommend nonemergent thyroid US (ref: J Am Coll Radiol. 2015 Feb;12(2): 143-50). 4. Aortic  and coronary artery atherosclerosis (ICD10-I70.0). Electronically Signed   By: Davina Poke D.O.   On: 08/17/2021 11:36   US Venous Img Lower Unilateral Left (DVT)  Result Date: 08/17/2021 CLINICAL DATA:  Left thigh and knee pain and edema. EXAM: LEFT LOWER EXTREMITY VENOUS DOPPLER ULTRASOUND TECHNIQUE: Gray-scale sonography with graded compression, as well as color Doppler and duplex ultrasound were performed to evaluate the lower extremity deep venous systems from the level of the common femoral vein and  including the common femoral, femoral, profunda femoral, popliteal and calf veins including the posterior tibial, peroneal and gastrocnemius veins when visible. The superficial great saphenous vein was also interrogated. Spectral Doppler was utilized to evaluate flow at rest and with distal augmentation maneuvers in the common femoral, femoral and popliteal veins. COMPARISON:  None. FINDINGS: Contralateral Common Femoral Vein: Respiratory phasicity is normal and symmetric with the symptomatic side. No evidence of thrombus. Normal compressibility. Common Femoral Vein: No evidence of thrombus. Normal compressibility, respiratory phasicity and response to augmentation. Saphenofemoral Junction: No evidence of thrombus. Normal compressibility and flow on color Doppler imaging. Profunda Femoral Vein: No evidence of thrombus. Normal compressibility and flow on color Doppler imaging. Femoral Vein: Nonocclusive thrombus is seen throughout the course of the femoral vein in the left thigh. Popliteal Vein: No evidence of thrombus. Normal compressibility, respiratory phasicity and response to augmentation. Calf Veins: No thrombus identified in the visualized posterior tibial or peroneal veins. There is thrombus seen in a gastrocnemius/soleal vein. Superficial Great Saphenous Vein: No evidence of thrombus. Normal compressibility. Venous Reflux:  None. Other Findings: No evidence of superficial thrombophlebitis or abnormal fluid collection. IMPRESSION: Positive for left lower extremity DVT with nonocclusive thrombus seen throughout the left femoral vein and thrombus identified in a gastrocnemius/soleal vein. Electronically Signed   By: Aletta Edouard M.D.   On: 08/17/2021 10:22   DG CHEST PORT 1 VIEW  Result Date: 08/18/2021 CLINICAL DATA:  Fever EXAM: PORTABLE CHEST 1 VIEW COMPARISON:  08/17/2021 FINDINGS: Heart size is upper limits of normal. Aortic atherosclerosis. No focal airspace consolidation, pleural effusion, or  pneumothorax. IMPRESSION: No active disease. Electronically Signed   By: Davina Poke D.O.   On: 08/18/2021 17:59   DG Knee Complete 4 Views Left  Result Date: 08/17/2021 CLINICAL DATA:  Reactive EXAM: LEFT KNEE - COMPLETE 4+ VIEW COMPARISON:  None. FINDINGS: No recent fracture is seen. There is no significant effusion in the suprapatellar bursa. Marked degenerative changes are noted in radial lateral patellofemoral compartments, more so in the patellofemoral compartment osteopenia is seen in both hips. There are no opaque foreign bodies. IMPRESSION: No recent fracture is seen. Marked degenerative changes are noted, more so in the patellofemoral compartment. Osteopenia. Reading location: [place], [place]. Electronically Signed   By: Elmer Picker   On: 08/17/2021 10:57        Scheduled Meds:  enoxaparin (LOVENOX) injection  80 mg Subcutaneous Q12H   metoprolol tartrate  25 mg Oral BID   pravastatin  20 mg Oral q1800   Continuous Infusions:  cefTRIAXone (ROCEPHIN)  IV 1 g (08/18/21 1721)     LOS: 0 days    Time spent: 49mins    Kathie Dike, MD [facility]   If 7PM-7AM, please contact night-coverage [email or web address]  08/18/2021, 7:01 PM

## 2021-08-18 NOTE — Progress Notes (Signed)
ANTICOAGULATION CONSULT NOTE  Pharmacy Consult for heparin Indication: DVT  No Known Allergies  Patient Measurements: Ht: 69 in Wt: 75 kg  Vital Signs: Temp: 99.2 F (37.3 C) (10/27 0607) Temp Source: Oral (10/27 0607) BP: 120/74 (10/27 0607) Pulse Rate: 111 (10/27 0607)  Labs: Recent Labs    08/17/21 0932 08/17/21 2201 08/18/21 0459 08/18/21 0829  HGB 12.4  --  11.6*  --   HCT 38.4  --  37.6  --   PLT 239  --  208  --   HEPARINUNFRC  --  <0.10*  --  0.19*  CREATININE 0.68  --  0.56  --      Estimated Creatinine Clearance: 67 mL/min (by C-G formula based on SCr of 0.56 mg/dL).   Medical History: Past Medical History:  Diagnosis Date   Arthritis    Depression    HTN (hypertension)    Hypercholesteremia    S/P colonoscopy 2009   adenoma, repeat 5 years    Medications:  Medications Prior to Admission  Medication Sig Dispense Refill Last Dose   acetaminophen (TYLENOL) 325 MG tablet Take 650 mg by mouth every 6 (six) hours as needed for mild pain or moderate pain.   PRN   cetirizine (ZYRTEC) 10 MG tablet Take 10 mg by mouth daily.   08/16/2021   diphenhydramine-acetaminophen (TYLENOL PM) 25-500 MG TABS tablet Take 1 tablet by mouth at bedtime as needed (for sleep).   PRN   hydrochlorothiazide 25 MG tablet Take 25 mg by mouth daily.     08/16/2021   lisinopril (PRINIVIL,ZESTRIL) 5 MG tablet Take 5 mg by mouth daily.     08/16/2021   lovastatin (MEVACOR) 20 MG tablet Take 20 mg by mouth at bedtime.     08/16/2021   Vitamin D, Ergocalciferol, (DRISDOL) 1.25 MG (50000 UNIT) CAPS capsule Take 50,000 Units by mouth once a week.   Past Week   ondansetron (ZOFRAN) 4 MG tablet Take 1 tablet (4 mg total) by mouth every 6 (six) hours. (Patient not taking: No sig reported) 8 tablet 0 Not Taking    Assessment: Pharmacy consulted to dose heparin in patient with left lower extremity DVT. Patient is not on anticoagulation prior to admission.  HL 0.19- subtherapeutic   Goal  of Therapy:  Heparin level 0.3-0.7 units/ml Monitor platelets by anticoagulation protocol: Yes   Plan:  Rebolus heparin 2000 units Increase heparin infusion to 1650 units/hr Check anti-Xa level in 8 hours  Margot Ables, PharmD Clinical Pharmacist 08/18/2021 8:58 AM

## 2021-08-18 NOTE — Progress Notes (Addendum)
ANTICOAGULATION CONSULT NOTE  Pharmacy Consult for heparin Indication: DVT  No Known Allergies  Patient Measurements: Ht: 69 in Wt: 75 kg  Vital Signs: Temp: 100.5 F (38.1 C) (10/27 1512) Temp Source: Oral (10/27 1512) BP: 127/79 (10/27 1512) Pulse Rate: 100 (10/27 1512)  Labs: Recent Labs    08/17/21 0932 08/17/21 2201 08/18/21 0459 08/18/21 0829 08/18/21 1706  HGB 12.4  --  11.6*  --   --   HCT 38.4  --  37.6  --   --   PLT 239  --  208  --   --   HEPARINUNFRC  --  <0.10*  --  0.19* <0.10*  CREATININE 0.68  --  0.56  --   --      Estimated Creatinine Clearance: 67 mL/min (by C-G formula based on SCr of 0.56 mg/dL).   Medical History: Past Medical History:  Diagnosis Date   Arthritis    Depression    HTN (hypertension)    Hypercholesteremia    S/P colonoscopy 2009   adenoma, repeat 5 years    Medications:  Medications Prior to Admission  Medication Sig Dispense Refill Last Dose   acetaminophen (TYLENOL) 325 MG tablet Take 650 mg by mouth every 6 (six) hours as needed for mild pain or moderate pain.   PRN   cetirizine (ZYRTEC) 10 MG tablet Take 10 mg by mouth daily.   08/16/2021   diphenhydramine-acetaminophen (TYLENOL PM) 25-500 MG TABS tablet Take 1 tablet by mouth at bedtime as needed (for sleep).   PRN   hydrochlorothiazide 25 MG tablet Take 25 mg by mouth daily.     08/16/2021   lisinopril (PRINIVIL,ZESTRIL) 5 MG tablet Take 5 mg by mouth daily.     08/16/2021   lovastatin (MEVACOR) 20 MG tablet Take 20 mg by mouth at bedtime.     08/16/2021   Vitamin D, Ergocalciferol, (DRISDOL) 1.25 MG (50000 UNIT) CAPS capsule Take 50,000 Units by mouth once a week.   Past Week   ondansetron (ZOFRAN) 4 MG tablet Take 1 tablet (4 mg total) by mouth every 6 (six) hours. (Patient not taking: No sig reported) 8 tablet 0 Not Taking    Assessment: Pharmacy consulted to dose heparin in patient with left lower extremity DVT. Patient is not on anticoagulation prior to  admission.  Repeat heparin level is undetectable. Per RN, pt has had issues pulling out IVs but are now running ok. Will defer bolus and increase infusion slightly with recheck in 8h.  Goal of Therapy:  Heparin level 0.3-0.7 units/ml Monitor platelets by anticoagulation protocol: Yes   Plan:  Increase heparin to 1800 units/h Recheck heparin level in 8h  ADDENDUM switching to enoxaparin. Will give 1mg /kg SQ BID  Arrie Senate, PharmD, BCPS, [facility] Clinical Pharmacist Please check AMION for all [facility] Pharmacy numbers 08/18/2021

## 2021-08-18 NOTE — NC FL2 (Signed)
[place] LEVEL OF CARE SCREENING TOOL     IDENTIFICATION  Patient Name: [name] Birthdate: 01-04-1941 Sex: female Admission Date (Current Location): 08/17/2021  [place] and [place] Number:  [facility] [id number] [place] and Address:  [place] [address], [place]      Provider Number: [id number]  Attending Physician Name and Address:  Kathie Dike, MD  Relative Name and Phone Number:  Prezley, Qadir (Granddaughter)   [phone]    Current Level of Care: Hospital Recommended Level of Care: [place] Prior Approval Number:    Date Approved/Denied:   PASRR Number: [id number]  Discharge Plan: SNF    Current Diagnoses: Patient Active Problem List   Diagnosis Date Noted   DVT (deep venous thrombosis) ([place]) 08/17/2021   Hypokalemia 08/17/2021   HTN (hypertension) 08/17/2021   Hyperlipidemia 08/17/2021   Adenomatous colon polyp 03/18/2013    Orientation RESPIRATION BLADDER Height & Weight     Time, Self, Situation, Place  Normal Continent Weight: 184 lb 8.4 oz (83.7 kg) Height:  5\' 9"  (175.3 cm)  BEHAVIORAL SYMPTOMS/MOOD NEUROLOGICAL BOWEL NUTRITION STATUS      Continent Diet (heart healthy)  AMBULATORY STATUS COMMUNICATION OF NEEDS Skin   Limited Assist Verbally Normal                       Personal Care Assistance Level of Assistance  Bathing, Feeding, Dressing Bathing Assistance: Limited assistance Feeding assistance: Independent Dressing Assistance: Limited assistance     Functional Limitations Info  Sight, Hearing, Speech Sight Info: Adequate Hearing Info: Adequate Speech Info: Adequate    SPECIAL CARE FACTORS FREQUENCY  PT (By licensed PT)     PT Frequency: 5x/week              Contractures Contractures Info: Not present    Additional Factors Info  Code Status, Allergies Code Status Info: Full Cod Allergies Info: NKA           Current Medications  (08/18/2021):  This is the current hospital active medication list Current Facility-Administered Medications  Medication Dose Route Frequency Provider Last Rate Last Admin   acetaminophen (TYLENOL) tablet 650 mg  650 mg Oral Q6H PRN Kathie Dike, MD   650 mg at 08/18/21 1518   Or   acetaminophen (TYLENOL) suppository 650 mg  650 mg Rectal Q6H PRN Kathie Dike, MD       cefTRIAXone (ROCEPHIN) 1 g in sodium chloride 0.9 % 100 mL IVPB  1 g Intravenous Q24H Memon, Jolaine Artist, MD       heparin ADULT infusion 100 units/mL (25000 units/21mL)  1,650 Units/hr Intravenous Continuous Kathie Dike, MD 16.5 mL/hr at 08/18/21 0914 1,650 Units/hr at 08/18/21 0914   metoprolol tartrate (LOPRESSOR) tablet 25 mg  25 mg Oral BID Kathie Dike, MD   25 mg at 08/18/21 0920   ondansetron (ZOFRAN) tablet 4 mg  4 mg Oral Q6H PRN Kathie Dike, MD       Or   ondansetron (ZOFRAN) injection 4 mg  4 mg Intravenous Q6H PRN Kathie Dike, MD       pravastatin (PRAVACHOL) tablet 20 mg  20 mg Oral q1800 Kathie Dike, MD   20 mg at 08/17/21 1722     Discharge Medications: Please see discharge summary for a list of discharge medications.  Relevant Imaging Results:  Relevant Lab Results:   Additional Information SSN [id number], Patient is vaccinated and boostered for COVID and has had the  Flu vaccination.  Jaremy Nosal, Clydene Pugh, LCSW

## 2021-08-18 NOTE — Progress Notes (Addendum)
Spoke to granddaughter about patient behavior.  Granddaughter called to room and spoke with patient.  Patient was confused with granddaughter on the phone and granddaughter was unable to reorient patient.  Granddaughter said patient sister would be by in the am.

## 2021-08-18 NOTE — ED Notes (Signed)
ED TO INPATIENT HANDOFF REPORT  ED Nurse Name and Phone #: Janace Hoard [phone]  S Name/Age/Gender [name] 80 y.o. female Room/Bed: APA19/APA19  Code Status   Code Status: Full Code  Home/SNF/Other  Patient oriented to: self and time      Triage Complete: Triage complete  Chief Complaint DVT (deep venous thrombosis) ([place]) [I82.409]  Triage Note Pt arrived VIA [facility]. Pt c/o chronic left knee pain that has became worse over the last week. Per EMS pt is also tachycardic.    Allergies No Known Allergies  Level of Care/Admitting Diagnosis ED Disposition     ED Disposition  Admit   Condition  --   Branch: [facility] [[id number]]  Level of Care: Telemetry [5]  Covid Evaluation: Asymptomatic Screening Protocol (No Symptoms)  Diagnosis: DVT (deep venous thrombosis) [facility]) [[id number]]  Admitting Physician: [place], [place]  Attending Physician: Kathie Dike [[id number]]          B Medical/Surgery History Past Medical History:  Diagnosis Date   Arthritis    Depression    HTN (hypertension)    Hypercholesteremia    S/P colonoscopy 2009   adenoma, repeat 5 years   Past Surgical History:  Procedure Laterality Date   CATARACT EXTRACTION W/PHACO Right 04/14/2014   Procedure: CATARACT EXTRACTION PHACO AND INTRAOCULAR LENS PLACEMENT (IOC) CDE=6.22;  Surgeon: Elta Guadeloupe T. Gershon Crane, MD;  Location: [facility];  Service: Ophthalmology;  Laterality: Right;   CATARACT EXTRACTION W/PHACO Left 06/16/2014   Procedure: CATARACT EXTRACTION PHACO AND INTRAOCULAR LENS PLACEMENT (IOC);  Surgeon: Elta Guadeloupe T. Gershon Crane, MD;  Location: [facility];  Service: Ophthalmology;  Laterality: Left;  CDE:8.01   COLONOSCOPY  02/17/2005   WGN:FAOZHY polyp and right colon polyp, status post snare polypectomy/Otherwise normal rectum and colon   COLONOSCOPY  02/18/2008   RMR: Normal rectum/Polyp in the hepatic flexure removed with snare cecal polyps were cold biopsied/removed; remainder of the  colonic mucosa appeared normal. Tubluar adenoma   COLONOSCOPY N/A 03/26/2013   Procedure: COLONOSCOPY;  Surgeon: Daneil Dolin, MD;  Location: [facility];  Service: Endoscopy;  Laterality: N/A;  1:15   COLONOSCOPY N/A 06/05/2018   Procedure: COLONOSCOPY;  Surgeon: Daneil Dolin, MD;  Location[facility];  Service: Endoscopy;  Laterality: N/A;  10:30   TOTAL ABDOMINAL HYSTERECTOMY     fibroid tumors   UTERINE FIBROID SURGERY       A IV Location/Drains/Wounds Patient Lines/Drains/Airways Status     Active Line/Drains/Airways     Name Placement date Placement time Site Days   Peripheral IV 08/17/21 20 G 1" Right Antecubital 08/17/21  0933  Antecubital  1   Peripheral IV 08/17/21 20 G 1" Anterior;Right Forearm 08/17/21  1458  Forearm  1   External Urinary Catheter 08/17/21  1639  --  1   Incision (Closed) 04/14/14 Eye Right 04/14/14  0858  -- 2683   Incision (Closed) 06/16/14 Eye Left 06/16/14  0832  -- 2620            Intake/Output Last 24 hours No intake or output data in the 24 hours ending 08/18/21 0108  Labs/Imaging Results for orders placed or performed during the hospital encounter of 08/17/21 (from the past 48 hour(s))  CBC with Differential     Status: Abnormal   Collection Time: 08/17/21  9:32 AM  Result Value Ref Range   WBC 6.3 4.0 - 10.5 K/uL   RBC 4.51 3.87 - 5.11 MIL/uL   Hemoglobin 12.4  12.0 - 15.0 g/dL   HCT 38.4 36.0 - 46.0 %   MCV 85.1 80.0 - 100.0 fL   MCH 27.5 26.0 - 34.0 pg   MCHC 32.3 30.0 - 36.0 g/dL   RDW 13.3 11.5 - 15.5 %   Platelets 239 150 - 400 K/uL   nRBC 0.0 0.0 - 0.2 %   Neutrophils Relative % 79 %   Neutro Abs 5.0 1.7 - 7.7 K/uL   Lymphocytes Relative 9 %   Lymphs Abs 0.6 (L) 0.7 - 4.0 K/uL   Monocytes Relative 11 %   Monocytes Absolute 0.7 0.1 - 1.0 K/uL   Eosinophils Relative 0 %   Eosinophils Absolute 0.0 0.0 - 0.5 K/uL   Basophils Relative 0 %   Basophils Absolute 0.0 0.0 - 0.1 K/uL   Immature Granulocytes 1 %   Abs  Immature Granulocytes 0.03 0.00 - 0.07 K/uL    Comment: Performed at [facility], [address] [place], [place] [address]  Comprehensive metabolic panel     Status: Abnormal   Collection Time: 08/17/21  9:32 AM  Result Value Ref Range   Sodium 135 135 - 145 mmol/L   Potassium 3.1 (L) 3.5 - 5.1 mmol/L   Chloride 98 98 - 111 mmol/L   CO2 28 22 - 32 mmol/L   Glucose, Bld 152 (H) 70 - 99 mg/dL    Comment: Glucose reference range applies only to samples taken after fasting for at least 8 hours.   BUN 23 8 - 23 mg/dL   Creatinine, Ser 0.68 0.44 - 1.00 mg/dL   Calcium 9.0 8.9 - 10.3 mg/dL   Total Protein 7.3 6.5 - 8.1 g/dL   Albumin 3.4 (L) 3.5 - 5.0 g/dL   AST 19 15 - 41 U/L   ALT 15 0 - 44 U/L   Alkaline Phosphatase 58 38 - 126 U/L   Total Bilirubin 0.9 0.3 - 1.2 mg/dL   GFR, Estimated >60 >60 mL/min    Comment: (NOTE) Calculated using the CKD-EPI Creatinine Equation (2021)    Anion gap 9 5 - 15    Comment: Performed at [facility], [address], [place], [place] [address]  Sedimentation rate     Status: Abnormal   Collection Time: 08/17/21  9:32 AM  Result Value Ref Range   Sed Rate 65 (H) 0 - 22 mm/hr    Comment: Performed at [facility], [address], [place], [place] [address]  Magnesium     Status: None   Collection Time: 08/17/21  9:32 AM  Result Value Ref Range   Magnesium 2.1 1.7 - 2.4 mg/dL    Comment: Performed at [facility], [address], [place], [place] [address]  Lactic acid, plasma     Status: None   Collection Time: 08/17/21  9:33 AM  Result Value Ref Range   Lactic Acid, Venous 1.3 0.5 - 1.9 mmol/L    Comment: Performed at [facility], [address], [place], [place] [address]  C-reactive protein     Status: Abnormal   Collection Time: 08/17/21  9:33 AM  Result Value Ref Range   CRP 22.8 (H) <1.0 mg/dL    Comment: Performed at [facility], [address], [place],  [address]  Resp Panel by RT-PCR (Flu A&B, Covid) Nasopharyngeal Swab      Status: None   Collection Time: 08/17/21  1:17 PM   Specimen: Nasopharyngeal Swab; Nasopharyngeal(NP) swabs in vial transport medium  Result Value Ref Range  SARS Coronavirus 2 by RT PCR NEGATIVE NEGATIVE    Comment: (NOTE) SARS-CoV-2 target nucleic acids are NOT DETECTED.  The SARS-CoV-2 RNA is generally detectable in upper respiratory specimens during the acute phase of infection. The lowest concentration of SARS-CoV-2 viral copies this assay can detect is 138 copies/mL. A negative result does not preclude SARS-Cov-2 infection and should not be used as the sole basis for treatment or other patient management decisions. A negative result may occur with  improper specimen collection/handling, submission of specimen other than nasopharyngeal swab, presence of viral mutation(s) within the areas targeted by this assay, and inadequate number of viral copies(<138 copies/mL). A negative result must be combined with clinical observations, patient history, and epidemiological information. The expected result is Negative.  Fact Sheet for Patients:  EntrepreneurPulse.com.au  Fact Sheet for Healthcare Providers:  IncredibleEmployment.be  This test is no t yet approved or cleared by the [place] FDA and  has been authorized for detection and/or diagnosis of SARS-CoV-2 by FDA under an Emergency Use Authorization (EUA). This EUA will remain  in effect (meaning this test can be used) for the duration of the COVID-19 declaration under Section 564(b)(1) of the Act, 21 U.S.C.section 360bbb-3(b)(1), unless the authorization is terminated  or revoked sooner.       Influenza A by PCR NEGATIVE NEGATIVE   Influenza B by PCR NEGATIVE NEGATIVE    Comment: (NOTE) The Xpert Xpress SARS-CoV-2/FLU/RSV plus assay is intended as an aid in the diagnosis of influenza from Nasopharyngeal swab specimens and should not be used as a sole basis for treatment. Nasal  washings and aspirates are unacceptable for Xpert Xpress SARS-CoV-2/FLU/RSV testing.  Fact Sheet for Patients: EntrepreneurPulse.com.au  Fact Sheet for Healthcare Providers: IncredibleEmployment.be  This test is not yet approved or cleared by the [place] FDA and has been authorized for detection and/or diagnosis of SARS-CoV-2 by FDA under an Emergency Use Authorization (EUA). This EUA will remain in effect (meaning this test can be used) for the duration of the COVID-19 declaration under Section 564(b)(1) of the Act, 21 U.S.C. section 360bbb-3(b)(1), unless the authorization is terminated or revoked.  Performed at [facility], [address], [place], [place] [address]   Heparin level (unfractionated)     Status: Abnormal   Collection Time: 08/17/21 10:01 PM  Result Value Ref Range   Heparin Unfractionated <0.10 (L) 0.30 - 0.70 IU/mL    Comment: (NOTE) The clinical reportable range upper limit is being lowered to >1.10 to align with the FDA approved guidance for the current laboratory assay.  If heparin results are below expected values, and patient dosage has  been confirmed, suggest follow up testing of antithrombin III levels. Performed at [facility], [address], [place], [place] [address]    CT Angio Chest PE W and/or Wo Contrast  Result Date: 08/17/2021 CLINICAL DATA:  Positive right DVT with tachycardia EXAM: CT ANGIOGRAPHY CHEST WITH CONTRAST TECHNIQUE: Multidetector CT imaging of the chest was performed using the standard protocol during bolus administration of intravenous contrast. Multiplanar CT image reconstructions and MIPs were obtained to evaluate the vascular anatomy. CONTRAST:  53mL OMNIPAQUE IOHEXOL 350 MG/ML SOLN COMPARISON:  None. FINDINGS: Cardiovascular: Satisfactory opacification of the pulmonary arteries to the segmental level. No evidence of pulmonary embolism. Pulmonary trunk measures 3.7 cm in diameter.  Thoracic aorta is nonaneurysmal. Atherosclerotic calcifications of the aorta and coronary arteries. Heart size is upper limits of normal. No pericardial effusion. Mediastinum/Nodes: No axillary, mediastinal, or hilar lymphadenopathy. Multinodular thyroid  gland with dominant nodule in the left thyroid lobe measuring at least 3.1 cm. Trachea and esophagus within normal limits. Lungs/Pleura: Lungs are clear. No pleural effusion or pneumothorax. Upper Abdomen: No acute abnormality. Musculoskeletal: Multiple areas of increased sclerosis within the sternum and manubrium. The posterior wall of the manubrium is ill-defined (series 8, images 75-78). No additional bony lesions are identified. No fracture. Multilevel degenerative changes within the thoracic spine. No chest wall abnormality. Review of the MIP images confirms the above findings. IMPRESSION: 1. No evidence of acute pulmonary embolism. 2. Multiple areas of increased sclerosis within the sternum and manubrium. The posterior wall of the manubrium is ill-defined. Findings are suspicious for metastatic disease versus myeloma. MRI of the sternum without and with intravenous contrast is recommended for further evaluation. Nucleolar medicine bone scan could also be performed to assess for additional lesions. 3. Multinodular thyroid gland with dominant nodule in the left thyroid lobe measuring at least 3.1 cm. Recommend nonemergent thyroid US (ref: J Am Coll Radiol. 2015 Feb;12(2): 143-50). 4. Aortic and coronary artery atherosclerosis (ICD10-I70.0). Electronically Signed   By: Davina Poke D.O.   On: 08/17/2021 11:36   US Venous Img Lower Unilateral Left (DVT)  Result Date: 08/17/2021 CLINICAL DATA:  Left thigh and knee pain and edema. EXAM: LEFT LOWER EXTREMITY VENOUS DOPPLER ULTRASOUND TECHNIQUE: Gray-scale sonography with graded compression, as well as color Doppler and duplex ultrasound were performed to evaluate the lower extremity deep venous systems from  the level of the common femoral vein and including the common femoral, femoral, profunda femoral, popliteal and calf veins including the posterior tibial, peroneal and gastrocnemius veins when visible. The superficial great saphenous vein was also interrogated. Spectral Doppler was utilized to evaluate flow at rest and with distal augmentation maneuvers in the common femoral, femoral and popliteal veins. COMPARISON:  None. FINDINGS: Contralateral Common Femoral Vein: Respiratory phasicity is normal and symmetric with the symptomatic side. No evidence of thrombus. Normal compressibility. Common Femoral Vein: No evidence of thrombus. Normal compressibility, respiratory phasicity and response to augmentation. Saphenofemoral Junction: No evidence of thrombus. Normal compressibility and flow on color Doppler imaging. Profunda Femoral Vein: No evidence of thrombus. Normal compressibility and flow on color Doppler imaging. Femoral Vein: Nonocclusive thrombus is seen throughout the course of the femoral vein in the left thigh. Popliteal Vein: No evidence of thrombus. Normal compressibility, respiratory phasicity and response to augmentation. Calf Veins: No thrombus identified in the visualized posterior tibial or peroneal veins. There is thrombus seen in a gastrocnemius/soleal vein. Superficial Great Saphenous Vein: No evidence of thrombus. Normal compressibility. Venous Reflux:  None. Other Findings: No evidence of superficial thrombophlebitis or abnormal fluid collection. IMPRESSION: Positive for left lower extremity DVT with nonocclusive thrombus seen throughout the left femoral vein and thrombus identified in a gastrocnemius/soleal vein. Electronically Signed   By: Aletta Edouard M.D.   On: 08/17/2021 10:22   DG Knee Complete 4 Views Left  Result Date: 08/17/2021 CLINICAL DATA:  Reactive EXAM: LEFT KNEE - COMPLETE 4+ VIEW COMPARISON:  None. FINDINGS: No recent fracture is seen. There is no significant effusion in  the suprapatellar bursa. Marked degenerative changes are noted in radial lateral patellofemoral compartments, more so in the patellofemoral compartment osteopenia is seen in both hips. There are no opaque foreign bodies. IMPRESSION: No recent fracture is seen. Marked degenerative changes are noted, more so in the patellofemoral compartment. Osteopenia. Reading location: [place], [place]. Electronically Signed   By: Elmer Picker   On: 08/17/2021 10:57  Pending Labs Unresulted Labs (From admission, onward)     Start     Ordered   08/19/21 0500  Heparin level (unfractionated)  Daily,   R      08/17/21 2325   08/18/21 0800  Heparin level (unfractionated)  Once-Timed,   TIMED        08/17/21 2325   08/18/21 0500  CBC  Daily,   R      08/17/21 1338   08/18/21 8101  Basic metabolic panel  Tomorrow morning,   R        08/17/21 1637            Vitals/Pain Today's Vitals   08/17/21 2214 08/17/21 2230 08/17/21 2300 08/17/21 2305  BP:  134/68 119/62   Pulse:  (!) 103 99   Resp:  18 18   Temp:      TempSrc:      SpO2:  100% 96%   Weight:      Height:      PainSc: 8    Asleep    Isolation Precautions No active isolations  Medications Medications  heparin ADULT infusion 100 units/mL (25000 units/271mL) (1,450 Units/hr Intravenous Rate/Dose Change 08/17/21 2330)  pravastatin (PRAVACHOL) tablet 20 mg (20 mg Oral Given 08/17/21 1722)  metoprolol tartrate (LOPRESSOR) tablet 25 mg (25 mg Oral Given 08/17/21 1722)  oxyCODONE (Oxy IR/ROXICODONE) immediate release tablet 5-10 mg (10 mg Oral Given 08/17/21 2214)  ondansetron (ZOFRAN) tablet 4 mg (has no administration in time range)    Or  ondansetron (ZOFRAN) injection 4 mg (has no administration in time range)  acetaminophen (TYLENOL) tablet 650 mg (has no administration in time range)    Or  acetaminophen (TYLENOL) suppository 650 mg (has no administration in time range)  sodium chloride 0.9 % bolus 1,000 mL (0 mLs  Intravenous Stopped 08/17/21 1346)  iohexol (OMNIPAQUE) 350 MG/ML injection 100 mL (80 mLs Intravenous Contrast Given 08/17/21 1045)  morphine 2 MG/ML injection 2 mg (2 mg Intravenous Given 08/17/21 1156)  morphine 4 MG/ML injection 4 mg (4 mg Intravenous Given 08/17/21 1333)  heparin bolus via infusion 4,500 Units (4,500 Units Intravenous Bolus from Bag 08/17/21 1356)  potassium chloride SA (KLOR-CON) CR tablet 40 mEq (40 mEq Oral Given 08/17/21 2029)  heparin bolus via infusion 2,000 Units (2,000 Units Intravenous Bolus from Bag 08/17/21 2328)    Mobility walks with device High fall risk   Focused Assessments    R Recommendations: See Admitting Provider Note  Report given to:   Additional Notes:

## 2021-08-18 NOTE — Plan of Care (Signed)
  Problem: Acute Rehab PT Goals(only PT should resolve) Goal: Pt Will Go Supine/Side To Sit Outcome: Progressing Flowsheets (Taken 08/18/2021 1447) Pt will go Supine/Side to Sit:  with min guard assist  with minimal assist Goal: Patient Will Transfer Sit To/From Stand Outcome: Progressing Flowsheets (Taken 08/18/2021 1447) Patient will transfer sit to/from stand: with moderate assist Goal: Pt Will Transfer Bed To Chair/Chair To Bed Outcome: Progressing Flowsheets (Taken 08/18/2021 1447) Pt will Transfer Bed to Chair/Chair to Bed: with mod assist Goal: Pt Will Ambulate Outcome: Progressing Flowsheets (Taken 08/18/2021 1447) Pt will Ambulate:  25 feet  with minimal assist  with moderate assist  with rolling walker   2:47 PM, 08/18/21 Lonell Grandchild, MPT Physical Therapist with [facility] [phone] office [phone] mobile phone

## 2021-08-18 NOTE — Evaluation (Signed)
Physical Therapy Evaluation Patient Details Name: [name] MRN: [id number] DOB: [birth year] Today's Date: 08/18/2021  History of Present Illness  [name] is a 79 y.o. female with medical history significant of hypertension, hyperlipidemia, was complaining of left lower extremity pain and swelling for the past week.  She reports that yesterday her symptoms got substantially worse to the point that she was unable to walk or bear weight on her leg.  Denies any preceding trauma.  She has not been sedentary/immobile prior to onset of symptoms.  She has not been started on any new medications.  Reports that her appetite has been good.  She has noticed weight loss of approximately 14 pounds in the past year.  She denies any chest pain or shortness of breath.   Clinical Impression  Patient presents confused possibly due to medication - RN aware.  Patient demonstrates slow labored movement for sitting up at bedside with limited movement of BLE due to c/o severe pain with pressure, unable to stand or transfer to chair after repeated attempts due to BLE weakness/pain and fair/poor carryover for following instructions.  Patient required Max assist to reposition when put back to bed.  Patient will benefit from continued physical therapy in hospital and recommended venue below to increase strength, balance, endurance for safe ADLs and gait.         Recommendations for follow up therapy are one component of a multi-disciplinary discharge planning process, led by the attending physician.  Recommendations may be updated based on patient status, additional functional criteria and insurance authorization.  Follow Up Recommendations Skilled nursing-short term rehab (<3 hours/day)    Assistance Recommended at Discharge Intermittent Supervision/Assistance  Functional Status Assessment Patient has had a recent decline in their functional status and demonstrates the ability to make significant improvements  in function in a reasonable and predictable amount of time.  Equipment Recommendations  None recommended by PT    Recommendations for Other Services       Precautions / Restrictions Precautions Precautions: Fall Restrictions Weight Bearing Restrictions: No      Mobility  Bed Mobility Overal bed mobility: Needs Assistance Bed Mobility: Supine to Sit;Sit to Supine     Supine to sit: Mod assist Sit to supine: Max assist;Mod assist   General bed mobility comments: slow labored movement with limited movement of BLE due to c/o increased pain    Transfers                        Ambulation/Gait                Stairs            Wheelchair Mobility    Modified Rankin (Stroke Patients Only)       Balance Overall balance assessment: Needs assistance Sitting-balance support: Feet supported;No upper extremity supported Sitting balance-Leahy Scale: Fair Sitting balance - Comments: fair/good seated at EOB                                     Pertinent Vitals/Pain Pain Assessment: Faces Faces Pain Scale: Hurts even more Breathing: normal Pain Location: BLE with movement pressure Pain Descriptors / Indicators: Aching;Sore;Grimacing;Guarding Pain Intervention(s): Limited activity within patient's tolerance;Monitored during session;Repositioned;Premedicated before session    Home Living Family/patient expects to be discharged to:: Private residence Living Arrangements: Other relatives (granddaughter per patient) Available Help at Discharge: Family;Available  PRN/intermittently Type of Home: Apartment Home Access: Stairs to enter   Entrance Stairs-Number of Steps: 1   Home Layout: One level Home Equipment: Conservation officer, nature (2 wheels);Wheelchair - manual;BSC;Shower seat      Prior Function Prior Level of Function : Independent/Modified Independent             Mobility Comments: household ambulator without AD, "per patient" ADLs  Comments: assisted by family     Hand Dominance        Extremity/Trunk Assessment   Upper Extremity Assessment Upper Extremity Assessment: Generalized weakness    Lower Extremity Assessment Lower Extremity Assessment: Generalized weakness    Cervical / Trunk Assessment Cervical / Trunk Assessment: Normal  Communication   Communication: No difficulties  Cognition Arousal/Alertness: Awake/alert Behavior During Therapy: Anxious;Impulsive Overall Cognitive Status: No family/caregiver present to determine baseline cognitive functioning                                          General Comments      Exercises     Assessment/Plan    PT Assessment Patient needs continued PT services  PT Problem List Decreased activity tolerance;Decreased balance;Decreased mobility;Decreased range of motion;Decreased strength       PT Treatment Interventions DME instruction;Gait training;Stair training;Functional mobility training;Therapeutic activities;Therapeutic exercise;Balance training;Patient/family education    PT Goals (Current goals can be found in the Care Plan section)  Acute Rehab PT Goals Patient Stated Goal: return home PT Goal Formulation: With patient Time For Goal Achievement: 08/25/21 Potential to Achieve Goals: Good    Frequency Min 3X/week   Barriers to discharge        Co-evaluation               AM-PAC PT "6 Clicks" Mobility  Outcome Measure Help needed turning from your back to your side while in a flat bed without using bedrails?: A Lot Help needed moving from lying on your back to sitting on the side of a flat bed without using bedrails?: A Lot Help needed moving to and from a bed to a chair (including a wheelchair)?: Total Help needed standing up from a chair using your arms (e.g., wheelchair or bedside chair)?: Total Help needed to walk in hospital room?: Total Help needed climbing 3-5 steps with a railing? : Total 6 Click Score:  8    End of Session   Activity Tolerance: Patient tolerated treatment well;Patient limited by fatigue;Patient limited by pain Patient left: in bed;with call bell/phone within reach;with bed alarm set Nurse Communication: Mobility status PT Visit Diagnosis: Unsteadiness on feet (R26.81);Other abnormalities of gait and mobility (R26.89);Muscle weakness (generalized) (M62.81)    Time: 4536-4680 PT Time Calculation (min) (ACUTE ONLY): 30 min   Charges:   PT Evaluation $PT Eval Moderate Complexity: 1 Mod PT Treatments $Therapeutic Activity: 23-37 mins        2:46 PM, 08/18/21 Lonell Grandchild, MPT Physical Therapist with [facility] [phone] office [phone] mobile phone

## 2021-08-18 NOTE — Progress Notes (Addendum)
Patient in room, and very agitated.  Patient is only alert and oriented x 1.  Patient has pulled out both ivs, threw telemetry box across room, and pulled call light from the wall.  Patient is not easily oriented, have told patient several times that she is in hospital for her knee, but does not verbalize understanding.  Difficult to obtain temperature during vital sign check due to patient refusing.  Will leave patient alone for now and will attempt to restart iv and telemetry when patient is calmer.

## 2021-08-19 ENCOUNTER — Inpatient Hospital Stay (HOSPITAL_COMMUNITY): Payer: Medicare Other

## 2021-08-19 DIAGNOSIS — R509 Fever, unspecified: Secondary | ICD-10-CM | POA: Diagnosis not present

## 2021-08-19 DIAGNOSIS — R9431 Abnormal electrocardiogram [ECG] [EKG]: Secondary | ICD-10-CM | POA: Diagnosis not present

## 2021-08-19 DIAGNOSIS — G9341 Metabolic encephalopathy: Secondary | ICD-10-CM | POA: Diagnosis not present

## 2021-08-19 DIAGNOSIS — I10 Essential (primary) hypertension: Secondary | ICD-10-CM | POA: Diagnosis not present

## 2021-08-19 DIAGNOSIS — I82492 Acute embolism and thrombosis of other specified deep vein of left lower extremity: Secondary | ICD-10-CM | POA: Diagnosis not present

## 2021-08-19 LAB — ECHOCARDIOGRAM COMPLETE
AR max vel: 1.43 cm2
AV Area VTI: 1.68 cm2
AV Area mean vel: 1.38 cm2
AV Mean grad: 7 mmHg
AV Peak grad: 13.1 mmHg
Ao pk vel: 1.81 m/s
Area-P 1/2: 4.49 cm2
Calc EF: 62.4 %
Height: 69 in
MV VTI: 2.48 cm2
S' Lateral: 2.3 cm
Single Plane A2C EF: 62.5 %
Single Plane A4C EF: 63.9 %
Weight: 2952.4 oz

## 2021-08-19 LAB — COMPREHENSIVE METABOLIC PANEL
ALT: 24 U/L (ref 0–44)
AST: 26 U/L (ref 15–41)
Albumin: 2.7 g/dL — ABNORMAL LOW (ref 3.5–5.0)
Alkaline Phosphatase: 55 U/L (ref 38–126)
Anion gap: 8 (ref 5–15)
BUN: 17 mg/dL (ref 8–23)
CO2: 27 mmol/L (ref 22–32)
Calcium: 8.7 mg/dL — ABNORMAL LOW (ref 8.9–10.3)
Chloride: 100 mmol/L (ref 98–111)
Creatinine, Ser: 0.52 mg/dL (ref 0.44–1.00)
GFR, Estimated: >60 mL/min (ref >60)
Glucose, Bld: 116 mg/dL — ABNORMAL HIGH (ref 70–99)
Potassium: 3.8 mmol/L (ref 3.5–5.1)
Sodium: 135 mmol/L (ref 135–145)
Total Bilirubin: 0.8 mg/dL (ref 0.3–1.2)
Total Protein: 6.6 g/dL (ref 6.5–8.1)

## 2021-08-19 LAB — URINE CULTURE

## 2021-08-19 LAB — CBC
HCT: 35.8 % — ABNORMAL LOW (ref 36.0–46.0)
Hemoglobin: 11.5 g/dL — ABNORMAL LOW (ref 12.0–15.0)
MCH: 27.3 pg (ref 26.0–34.0)
MCHC: 32.1 g/dL (ref 30.0–36.0)
MCV: 85 fL (ref 80.0–100.0)
Platelets: 245 10*3/uL (ref 150–400)
RBC: 4.21 MIL/uL (ref 3.87–5.11)
RDW: 13.2 % (ref 11.5–15.5)
WBC: 7.1 10*3/uL (ref 4.0–10.5)
nRBC: 0 % (ref 0.0–0.2)

## 2021-08-19 LAB — TSH: TSH: 0.782 u[IU]/mL (ref 0.350–4.500)

## 2021-08-19 MED ORDER — SODIUM CHLORIDE 0.9 % IV BOLUS
500.0000 mL | Freq: Once | INTRAVENOUS | Status: AC
  Administered 2021-08-19: 500 mL via INTRAVENOUS

## 2021-08-19 MED ORDER — KETOROLAC TROMETHAMINE 15 MG/ML IJ SOLN
15.0000 mg | Freq: Four times a day (QID) | INTRAMUSCULAR | Status: AC
  Administered 2021-08-19 – 2021-08-21 (×8): 15 mg via INTRAVENOUS
  Filled 2021-08-19 (×10): qty 1

## 2021-08-19 MED ORDER — LACTATED RINGERS IV SOLN: INTRAVENOUS | Status: DC

## 2021-08-19 NOTE — TOC Progression Note (Addendum)
Transition of Care [facility]) - Progression Note    Patient Details  Name: [name] MRN: [id number] Date of Birth: 28-Feb-1941  Transition of Care [facility]) CM/SW Contact  Ihor Gully, LCSW Phone Number: 08/19/2021, 1:04 PM  Clinical Narrative:    Spoke with patient's son, Mr. Lasky, at length about discharge plans. Auth started.   Expected Discharge Plan: [place] Barriers to Discharge: Continued Medical Work up  Expected Discharge Plan and Services Expected Discharge Plan: [place] Choice: [place] arrangements for the past 2 months: Apartment                                       Social Determinants of Health (SDOH) Interventions    Readmission Risk Interventions No flowsheet data found.

## 2021-08-19 NOTE — Progress Notes (Signed)
Patient found with IV removed from her  right AC. Stated she didn't know what was going on and removed it. Will attempt to reinsert and protect from patient removal.

## 2021-08-19 NOTE — Progress Notes (Signed)
Patient has not voided since beginning of shift.  Bladder scanned patient and it showed approximately 250 cc of urine in bladder.  Notified MD on call.  Received order for fluids bolus.  Bolus given. Will continue to monitor patient.

## 2021-08-19 NOTE — Progress Notes (Signed)
Physical Therapy Treatment Patient Details Name: [name] MRN: [id number] DOB: October 10, 1941 Today's Date: 08/19/2021   History of Present Illness [name] is a 80 y.o. female with medical history significant of hypertension, hyperlipidemia, was complaining of left lower extremity pain and swelling for the past week.  She reports that yesterday her symptoms got substantially worse to the point that she was unable to walk or bear weight on her leg.  Denies any preceding trauma.  She has not been sedentary/immobile prior to onset of symptoms.  She has not been started on any new medications.  Reports that her appetite has been good.  She has noticed weight loss of approximately 14 pounds in the past year.  She denies any chest pain or shortness of breath.    PT Comments    Patient demonstrates slow labored movement for sitting up at bedside with fair/poor carryover for using BUE during supine to sitting and scooting self forward at bedside.  Patient unable to stand or transfer to chair after repeated attempts due to BLE weakness and c/o severe pain with pressure.  Patient required Max assist to reposition when put back to bed.  Patient will benefit from continued physical therapy in hospital and recommended venue below to increase strength, balance, endurance for safe ADLs and gait.     Recommendations for follow up therapy are one component of a multi-disciplinary discharge planning process, led by the attending physician.  Recommendations may be updated based on patient status, additional functional criteria and insurance authorization.  Follow Up Recommendations  Skilled nursing-short term rehab (<3 hours/day)     Assistance Recommended at Discharge Intermittent Supervision/Assistance  Equipment Recommendations  None recommended by PT    Recommendations for Other Services       Precautions / Restrictions Precautions Precautions: Fall Restrictions Weight Bearing  Restrictions: No     Mobility  Bed Mobility Overal bed mobility: Needs Assistance Bed Mobility: Supine to Sit;Sit to Supine     Supine to sit: Mod assist Sit to supine: Mod assist;Max assist   General bed mobility comments: slow labored movement requiring repeated verbal/tactile cueing for proper hand placement and scooting self forward with fair carryover due to c/o BLE pain    Transfers                        Ambulation/Gait                 Stairs             Wheelchair Mobility    Modified Rankin (Stroke Patients Only)       Balance Overall balance assessment: Needs assistance Sitting-balance support: Feet supported;No upper extremity supported Sitting balance-Leahy Scale: Fair Sitting balance - Comments: fair/good seated at EOB                                    Cognition Arousal/Alertness: Awake/alert Behavior During Therapy: Anxious;Impulsive Overall Cognitive Status: No family/caregiver present to determine baseline cognitive functioning                                          Exercises      General Comments        Pertinent Vitals/Pain Pain Assessment: Faces Faces Pain Scale: Hurts even more Pain  Location: BLE with movement pressure Pain Descriptors / Indicators: Aching;Sore;Grimacing;Guarding Pain Intervention(s): Limited activity within patient's tolerance;Monitored during session;Repositioned    Home Living                          Prior Function            PT Goals (current goals can now be found in the care plan section) Acute Rehab PT Goals Patient Stated Goal: return home PT Goal Formulation: With patient Time For Goal Achievement: 08/25/21 Potential to Achieve Goals: Good Progress towards PT goals: Progressing toward goals    Frequency    Min 3X/week      PT Plan Current plan remains appropriate    Co-evaluation              AM-PAC PT "6 Clicks"  Mobility   Outcome Measure  Help needed turning from your back to your side while in a flat bed without using bedrails?: A Lot Help needed moving from lying on your back to sitting on the side of a flat bed without using bedrails?: A Lot Help needed moving to and from a bed to a chair (including a wheelchair)?: Total Help needed standing up from a chair using your arms (e.g., wheelchair or bedside chair)?: Total Help needed to walk in hospital room?: Total Help needed climbing 3-5 steps with a railing? : Total 6 Click Score: 8    End of Session   Activity Tolerance: Patient tolerated treatment well;Patient limited by fatigue;Patient limited by pain Patient left: in bed;with call bell/phone within reach;with bed alarm set Nurse Communication: Mobility status PT Visit Diagnosis: Unsteadiness on feet (R26.81);Other abnormalities of gait and mobility (R26.89);Muscle weakness (generalized) (M62.81)     Time: 1791-5056 PT Time Calculation (min) (ACUTE ONLY): 22 min  Charges:  $Therapeutic Activity: 8-22 mins                     2:38 PM, 08/19/21 Lonell Grandchild, MPT Physical Therapist with [facility] [phone] office [phone] mobile phone

## 2021-08-19 NOTE — Progress Notes (Signed)
Patient has order for restraints.  Patient has been in restraints at all since order placed.

## 2021-08-19 NOTE — Progress Notes (Signed)
PROGRESS NOTE    TAQUILLA [name]  HWE:[id number] DOB: August 23, 1941 DOA: 08/17/2021 PCP: Joyice Faster, FNP    Brief Narrative:  80 year old female with a history of hypertension and hyperlipidemia, admitted to the hospital with left lower extremity pain and swelling.  Found to have acute DVT.  Started on anticoagulation.  Having difficulty walking due to significant pain and swelling.  Seen by physical therapy with recommendations for skilled nursing facility.  During hospital course, she did become acutely confused.  She is noted to have mild fever.  Work-up for infectious process/altered mental status underway.   Assessment & Plan:   Active Problems:   DVT (deep venous thrombosis) (HCC)   Hypokalemia   HTN (hypertension)   Hyperlipidemia   Acute DVT left lower extremity -No obvious cause, although if she does have metastatic disease and this would be a precipitating factor -Currently on IV heparin, but heparin levels noted to be subtherapeutic through the day -We will transition to therapeutic Lovenox -We will need at least 3 to 6 months of therapy -No evidence of PE on CT chest -Reviewed case with Dr. Carlis Abbott on-call for vascular surgery.  Thrombectomy not recommended in this case.  It was felt that she would do reasonably well with anticoagulation -Keep lower extremity elevated  Sinus tachycardia -Noted to have persistent tachycardia on admission -Heart rate remained tachycardic despite improvement of pain in her leg. 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 Delton Coombes, oncology who felt it was reasonable to have this worked up as an outpatient and follow-up  Hypertension -Holding HCTZ and lisinopril for now -Blood pressure currently normotensive  Hyperlipidemia -Continue statin  Difficulty with  ambulation -Seen by PT with recommendations for skilled nursing facility  Acute encephalopathy -Etiology is unclear -Discussed with patient's granddaughter, she reports that patient is normally functional at baseline, continues to drive and pays her own bills -She does note that patient has occasionally been forgetful lately, at times asking if other family members have seen her husband who has passed away -She may have some underlying mild cognitive impairment -Overall mental status appears to be waxing and waning -Checking UA/urine culture -Opiate medications have been discontinued -Continue to monitor for now  Fever -Noted to have low-grade fever today of 100.3 -Checking UA/urine culture -We will empirically start on Rocephin -Chest x-ray without evidence of pneumonia -Fever may also be related to DVT  DVT prophylaxis:   Therapeutic Lovenox  Code Status: Full code Family Communication: Updated patient's son over the phone 10/28 Disposition Plan: Status is: Inpatient  Remains inpatient appropriate because: Patient has altered mental status, fever and continued pain of her left lower extremity         Consultants:    Procedures:    Antimicrobials:  Ceftriaxone 10/27 >   Subjective: Does not appear to be confused at this time.  Continues to complain of pain in her left knee.  Objective: Vitals:   08/18/21 2109 08/19/21 0631 08/19/21 1321 08/19/21 1814  BP: (!) 142/77 120/73 109/61   Pulse: 96 (!) 110 (!) 118   Resp: 17 17    Temp:  (!) 97.5 F (36.4 C) 100.2 F (37.9 C) 99.8 F (37.7 C)  TempSrc:  Oral Oral   SpO2: 96% 100% 99%   Weight:      Height:  Intake/Output Summary (Last 24 hours) at 08/19/2021 2022 Last data filed at 08/19/2021 1839 Gross per 24 hour  Intake 1442.71 ml  Output --  Net 1442.71 ml   Filed Weights   08/17/21 0903 08/17/21 1330 08/18/21 0157  Weight: 121.1 kg 75.2 kg 83.7 kg    Examination:  General exam:  Appears calm and comfortable  Respiratory system: Clear to auscultation. Respiratory effort normal. Cardiovascular system: S1 & S2 heard, RRR. No JVD, murmurs, rubs, gallops or clicks. No pedal edema. Gastrointestinal system: Abdomen is nondistended, soft and nontender. No organomegaly or masses felt. Normal bowel sounds heard. Central nervous system: Alert and oriented. No focal neurological deficits. Extremities: Continues to have pain, swelling and warmth in her left lower extremity Skin: No rashes, lesions or ulcers Psychiatry: Judgement and insight appear normal. Mood & affect appropriate.     Data Reviewed: I have personally reviewed following labs and imaging studies  CBC: Recent Labs  Lab 08/17/21 0932 08/18/21 0459 08/19/21 0416  WBC 6.3 6.3 7.1  NEUTROABS 5.0  --   --   HGB 12.4 11.6* 11.5*  HCT 38.4 37.6 35.8*  MCV 85.1 89.3 85.0  PLT 239 208 716   Basic Metabolic Panel: Recent Labs  Lab 08/17/21 0932 08/18/21 0459 08/19/21 0416  NA 135 134* 135  K 3.1* 3.9 3.8  CL 98 101 100  CO2 28 24 27   GLUCOSE 152* 112* 116*  BUN 23 18 17   CREATININE 0.68 0.56 0.52  CALCIUM 9.0 8.6* 8.7*  MG 2.1  --   --    GFR: Estimated Creatinine Clearance: 67 mL/min (by C-G formula based on SCr of 0.52 mg/dL). Liver Function Tests: Recent Labs  Lab 08/17/21 0932 08/19/21 0416  AST 19 26  ALT 15 24  ALKPHOS 58 55  BILITOT 0.9 0.8  PROT 7.3 6.6  ALBUMIN 3.4* 2.7*   No results for input(s): LIPASE, AMYLASE in the last 168 hours. No results for input(s): AMMONIA in the last 168 hours. Coagulation Profile: No results for input(s): INR, PROTIME in the last 168 hours. Cardiac Enzymes: No results for input(s): CKTOTAL, CKMB, CKMBINDEX, TROPONINI in the last 168 hours. BNP (last 3 results) No results for input(s): PROBNP in the last 8760 hours. HbA1C: No results for input(s): HGBA1C in the last 72 hours. CBG: No results for input(s): GLUCAP in the last 168 hours. Lipid  Profile: No results for input(s): CHOL, HDL, LDLCALC, TRIG, CHOLHDL, LDLDIRECT in the last 72 hours. Thyroid Function Tests: Recent Labs    08/19/21 0416  TSH 0.782   Anemia Panel: No results for input(s): VITAMINB12, FOLATE, FERRITIN, TIBC, IRON, RETICCTPCT in the last 72 hours. Sepsis Labs: Recent Labs  Lab 08/17/21 0933  LATICACIDVEN 1.3    Recent Results (from the past 240 hour(s))  Resp Panel by RT-PCR (Flu A&B, Covid) Nasopharyngeal Swab     Status: None   Collection Time: 08/17/21  1:17 PM   Specimen: Nasopharyngeal Swab; Nasopharyngeal(NP) swabs in vial transport medium  Result Value Ref Range Status   SARS Coronavirus 2 by RT PCR NEGATIVE NEGATIVE Final    Comment: (NOTE) SARS-CoV-2 target nucleic acids are NOT DETECTED.  The SARS-CoV-2 RNA is generally detectable in upper respiratory specimens during the acute phase of infection. The lowest concentration of SARS-CoV-2 viral copies this assay can detect is 138 copies/mL. A negative result does not preclude SARS-Cov-2 infection and should not be used as the sole basis for treatment or other patient management decisions. A negative result  may occur with  improper specimen collection/handling, submission of specimen other than nasopharyngeal swab, presence of viral mutation(s) within the areas targeted by this assay, and inadequate number of viral copies(<138 copies/mL). A negative result must be combined with clinical observations, patient history, and epidemiological information. The expected result is Negative.  Fact Sheet for Patients:  EntrepreneurPulse.com.au  Fact Sheet for Healthcare Providers:  IncredibleEmployment.be  This test is no t yet approved or cleared by the [place] FDA and  has been authorized for detection and/or diagnosis of SARS-CoV-2 by FDA under an Emergency Use Authorization (EUA). This EUA will remain  in effect (meaning this test can be used) for  the duration of the COVID-19 declaration under Section 564(b)(1) of the Act, 21 U.S.C.section 360bbb-3(b)(1), unless the authorization is terminated  or revoked sooner.       Influenza A by PCR NEGATIVE NEGATIVE Final   Influenza B by PCR NEGATIVE NEGATIVE Final    Comment: (NOTE) The Xpert Xpress SARS-CoV-2/FLU/RSV plus assay is intended as an aid in the diagnosis of influenza from Nasopharyngeal swab specimens and should not be used as a sole basis for treatment. Nasal washings and aspirates are unacceptable for Xpert Xpress SARS-CoV-2/FLU/RSV testing.  Fact Sheet for Patients: EntrepreneurPulse.com.au  Fact Sheet for Healthcare Providers: IncredibleEmployment.be  This test is not yet approved or cleared by the [place] FDA and has been authorized for detection and/or diagnosis of SARS-CoV-2 by FDA under an Emergency Use Authorization (EUA). This EUA will remain in effect (meaning this test can be used) for the duration of the COVID-19 declaration under Section 564(b)(1) of the Act, 21 U.S.C. section 360bbb-3(b)(1), unless the authorization is terminated or revoked.  Performed at [facility], [address], [place], [place] [address]          Radiology Studies: DG CHEST PORT 1 VIEW  Result Date: 08/18/2021 CLINICAL DATA:  Fever EXAM: PORTABLE CHEST 1 VIEW COMPARISON:  08/17/2021 FINDINGS: Heart size is upper limits of normal. Aortic atherosclerosis. No focal airspace consolidation, pleural effusion, or pneumothorax. IMPRESSION: No active disease. Electronically Signed   By: Davina Poke D.O.   On: 08/18/2021 17:59   ECHOCARDIOGRAM COMPLETE  Result Date: 08/19/2021    ECHOCARDIOGRAM REPORT   Patient Name:   [name] Date of Exam: 08/19/2021 Medical Rec #:  [id number]       Height:       69.0 in Accession #:    [id number]      Weight:       184.5 lb Date of Birth:  [birth year]      BSA:          1.996 m Patient Age:     26 years        BP:           120/73 mmHg Patient Gender: F               HR:           84 bpm. Exam Location:  [facility] Procedure: 2D Echo, Cardiac Doppler and Color Doppler Indications:    Abnormal ECG  History:        Patient has no prior history of Echocardiogram examinations.                 Risk Factors:Hypertension and Dyslipidemia.  Sonographer:    Wenda Low Referring Phys: [facility]  1. Left ventricular ejection fraction, by estimation, is 60 to 65%. The left ventricle has  normal function. The left ventricle has no regional wall motion abnormalities. There is severe left ventricular hypertrophy of the basal-septal segment. Left ventricular diastolic parameters are consistent with Grade I diastolic dysfunction (impaired relaxation).  2. Right ventricular systolic function is normal. The right ventricular size is normal. There is normal pulmonary artery systolic pressure.  3. The mitral valve is normal in structure. Mild mitral valve regurgitation. No evidence of mitral stenosis.  4. The aortic valve was not well visualized. Aortic valve regurgitation is not visualized. No aortic stenosis is present.  5. The inferior vena cava is normal in size with greater than 50% respiratory variability, suggesting right atrial pressure of 3 mmHg. FINDINGS  Left Ventricle: Severe basal septal hypertrophy, basal septum measures 1.9 cm. Left ventricular ejection fraction, by estimation, is 60 to 65%. The left ventricle has normal function. The left ventricle has no regional wall motion abnormalities. The left ventricular internal cavity size was normal in size. There is severe left ventricular hypertrophy of the basal-septal segment. Left ventricular diastolic parameters are consistent with Grade I diastolic dysfunction (impaired relaxation). Right Ventricle: The right ventricular size is normal. No increase in right ventricular wall thickness. Right ventricular systolic function is normal.  There is normal pulmonary artery systolic pressure. The tricuspid regurgitant velocity is 2.25 m/s, and  with an assumed right atrial pressure of 3 mmHg, the estimated right ventricular systolic pressure is 05.3 mmHg. Left Atrium: Left atrial size was normal in size. Right Atrium: Right atrial size was normal in size. Pericardium: There is no evidence of pericardial effusion. Mitral Valve: The mitral valve is normal in structure. Mild mitral valve regurgitation. No evidence of mitral valve stenosis. MV peak gradient, 7.1 mmHg. The mean mitral valve gradient is 3.0 mmHg. Tricuspid Valve: The tricuspid valve is normal in structure. Tricuspid valve regurgitation is mild . No evidence of tricuspid stenosis. Aortic Valve: The aortic valve was not well visualized. Aortic valve regurgitation is not visualized. No aortic stenosis is present. Aortic valve mean gradient measures 7.0 mmHg. Aortic valve peak gradient measures 13.1 mmHg. Aortic valve area, by VTI measures 1.68 cm. Pulmonic Valve: The pulmonic valve was not well visualized. Pulmonic valve regurgitation is not visualized. No evidence of pulmonic stenosis. Aorta: The aortic root is normal in size and structure. Venous: The inferior vena cava is normal in size with greater than 50% respiratory variability, suggesting right atrial pressure of 3 mmHg. IAS/Shunts: No atrial level shunt detected by color flow Doppler.  LEFT VENTRICLE PLAX 2D LVIDd:         3.40 cm     Diastology LVIDs:         2.30 cm     LV e' medial:    8.05 cm/s LV PW:         0.80 cm     LV E/e' medial:  7.2 LV IVS:        1.30 cm     LV e' lateral:   7.94 cm/s LVOT diam:     1.90 cm     LV E/e' lateral: 7.3 LV SV:         53 LV SV Index:   27 LVOT Area:     2.84 cm  LV Volumes (MOD) LV vol d, MOD A2C: 38.1 ml LV vol d, MOD A4C: 46.6 ml LV vol s, MOD A2C: 14.3 ml LV vol s, MOD A4C: 16.8 ml LV SV MOD A2C:     23.8 ml LV SV MOD A4C:  46.6 ml LV SV MOD BP:      26.5 ml RIGHT VENTRICLE RV Basal  diam:  3.30 cm RV Mid diam:    2.90 cm TAPSE (M-mode): 2.2 cm LEFT ATRIUM             Index        RIGHT ATRIUM           Index LA diam:        3.10 cm 1.55 cm/m   RA Area:     14.20 cm LA Vol (A2C):   69.5 ml 34.81 ml/m  RA Volume:   35.80 ml  17.93 ml/m LA Vol (A4C):   44.4 ml 22.24 ml/m LA Biplane Vol: 59.6 ml 29.85 ml/m  AORTIC VALVE AV Area (Vmax):    1.43 cm AV Area (Vmean):   1.38 cm AV Area (VTI):     1.68 cm AV Vmax:           181.00 cm/s AV Vmean:          124.000 cm/s AV VTI:            0.317 m AV Peak Grad:      13.1 mmHg AV Mean Grad:      7.0 mmHg LVOT Vmax:         91.40 cm/s LVOT Vmean:        60.400 cm/s LVOT VTI:          0.188 m LVOT/AV VTI ratio: 0.59  AORTA Ao Root diam: 2.40 cm MITRAL VALVE                TRICUSPID VALVE MV Area (PHT): 4.49 cm     TR Peak grad:   20.2 mmHg MV Area VTI:   2.48 cm     TR Vmax:        225.00 cm/s MV Peak grad:  7.1 mmHg MV Mean grad:  3.0 mmHg     SHUNTS MV Vmax:       1.33 m/s     Systemic VTI:  0.19 m MV Vmean:      70.9 cm/s    Systemic Diam: 1.90 cm MV Decel Time: 169 msec MV E velocity: 57.80 cm/s MV A velocity: 118.00 cm/s MV E/A ratio:  0.49 Carlyle Dolly MD Electronically signed by Carlyle Dolly MD Signature Date/Time: 08/19/2021/3:17:17 PM    Final         Scheduled Meds:  enoxaparin (LOVENOX) injection  80 mg Subcutaneous Q12H   ketorolac  15 mg Intravenous Q6H   metoprolol tartrate  25 mg Oral BID   pravastatin  20 mg Oral q1800   Continuous Infusions:  cefTRIAXone (ROCEPHIN)  IV 1 g (08/19/21 1613)   lactated ringers 75 mL/hr at 08/19/21 1839     LOS: 1 day    Time spent: 73mins    Kathie Dike, MD [facility]   If 7PM-7AM, please contact night-coverage [email or web address]  08/19/2021, 8:22 PM

## 2021-08-19 NOTE — Progress Notes (Signed)
*  PRELIMINARY RESULTS* Echocardiogram 2D Echocardiogram has been performed.  Elpidio Anis 08/19/2021, 12:30 PM

## 2021-08-20 DIAGNOSIS — G9341 Metabolic encephalopathy: Secondary | ICD-10-CM | POA: Diagnosis not present

## 2021-08-20 DIAGNOSIS — R509 Fever, unspecified: Secondary | ICD-10-CM | POA: Diagnosis not present

## 2021-08-20 DIAGNOSIS — I82492 Acute embolism and thrombosis of other specified deep vein of left lower extremity: Secondary | ICD-10-CM | POA: Diagnosis not present

## 2021-08-20 DIAGNOSIS — I10 Essential (primary) hypertension: Secondary | ICD-10-CM | POA: Diagnosis not present

## 2021-08-20 LAB — CBC
HCT: 32.5 % — ABNORMAL LOW (ref 36.0–46.0)
Hemoglobin: 10.9 g/dL — ABNORMAL LOW (ref 12.0–15.0)
MCH: 28.3 pg (ref 26.0–34.0)
MCHC: 33.5 g/dL (ref 30.0–36.0)
MCV: 84.4 fL (ref 80.0–100.0)
Platelets: 252 10*3/uL (ref 150–400)
RBC: 3.85 MIL/uL — ABNORMAL LOW (ref 3.87–5.11)
RDW: 13.2 % (ref 11.5–15.5)
WBC: 5.6 10*3/uL (ref 4.0–10.5)
nRBC: 0 % (ref 0.0–0.2)

## 2021-08-20 LAB — COMPREHENSIVE METABOLIC PANEL
ALT: 27 U/L (ref 0–44)
AST: 24 U/L (ref 15–41)
Albumin: 2.4 g/dL — ABNORMAL LOW (ref 3.5–5.0)
Alkaline Phosphatase: 51 U/L (ref 38–126)
Anion gap: 7 (ref 5–15)
BUN: 18 mg/dL (ref 8–23)
CO2: 26 mmol/L (ref 22–32)
Calcium: 8.5 mg/dL — ABNORMAL LOW (ref 8.9–10.3)
Chloride: 101 mmol/L (ref 98–111)
Creatinine, Ser: 0.56 mg/dL (ref 0.44–1.00)
GFR, Estimated: >60 mL/min (ref >60)
Glucose, Bld: 104 mg/dL — ABNORMAL HIGH (ref 70–99)
Potassium: 3.5 mmol/L (ref 3.5–5.1)
Sodium: 134 mmol/L — ABNORMAL LOW (ref 135–145)
Total Bilirubin: 0.4 mg/dL (ref 0.3–1.2)
Total Protein: 6.1 g/dL — ABNORMAL LOW (ref 6.5–8.1)

## 2021-08-20 LAB — AMMONIA: Ammonia: 31 umol/L (ref 9–35)

## 2021-08-20 MED ORDER — POTASSIUM CHLORIDE CRYS ER 20 MEQ PO TBCR
40.0000 meq | EXTENDED_RELEASE_TABLET | Freq: Once | ORAL | Status: AC
  Administered 2021-08-20: 40 meq via ORAL
  Filled 2021-08-20: qty 2

## 2021-08-20 MED ORDER — QUETIAPINE FUMARATE 25 MG PO TABS
25.0000 mg | ORAL_TABLET | Freq: Every day | ORAL | Status: DC
  Administered 2021-08-20 – 2021-08-22 (×3): 25 mg via ORAL
  Filled 2021-08-20 (×3): qty 1

## 2021-08-20 MED ORDER — CEPHALEXIN 500 MG PO CAPS
500.0000 mg | ORAL_CAPSULE | Freq: Two times a day (BID) | ORAL | Status: DC
  Administered 2021-08-21 – 2021-08-23 (×5): 500 mg via ORAL
  Filled 2021-08-20 (×5): qty 1

## 2021-08-20 NOTE — Plan of Care (Signed)
  Problem: Education: Goal: Knowledge of General Education information will improve Description: Including pain rating scale, medication(s)/side effects and non-pharmacologic comfort measures 08/20/2021 2305 by Tobe Sos, RN Outcome: Progressing 08/20/2021 2305 by Tobe Sos, RN Outcome: Progressing   Problem: Health Behavior/Discharge Planning: Goal: Ability to manage health-related needs will improve 08/20/2021 2305 by Tobe Sos, RN Outcome: Progressing 08/20/2021 2305 by Tobe Sos, RN Outcome: Progressing   Problem: Clinical Measurements: Goal: Ability to maintain clinical measurements within normal limits will improve 08/20/2021 2305 by Tobe Sos, RN Outcome: Progressing 08/20/2021 2305 by Tobe Sos, RN Outcome: Progressing Goal: Will remain free from infection 08/20/2021 2305 by Tobe Sos, RN Outcome: Progressing 08/20/2021 2305 by Tobe Sos, RN Outcome: Progressing Goal: Diagnostic test results will improve Outcome: Progressing Goal: Respiratory complications will improve Outcome: Progressing Goal: Cardiovascular complication will be avoided Outcome: Progressing   Problem: Activity: Goal: Risk for activity intolerance will decrease Outcome: Progressing   Problem: Nutrition: Goal: Adequate nutrition will be maintained Outcome: Progressing   Problem: Coping: Goal: Level of anxiety will decrease Outcome: Progressing   Problem: Elimination: Goal: Will not experience complications related to bowel motility Outcome: Progressing Goal: Will not experience complications related to urinary retention Outcome: Progressing   Problem: Pain Managment: Goal: General experience of comfort will improve Outcome: Progressing   Problem: Safety: Goal: Ability to remain free from injury will improve Outcome: Progressing   Problem: Skin Integrity: Goal: Risk for impaired skin integrity will  decrease Outcome: Progressing   Problem: Safety: Goal: Non-violent Restraint(s) Outcome: Progressing   Problem: Education: Goal: Knowledge of General Education information will improve Description: Including pain rating scale, medication(s)/side effects and non-pharmacologic comfort measures Outcome: Progressing   Problem: Health Behavior/Discharge Planning: Goal: Ability to manage health-related needs will improve Outcome: Progressing   Problem: Clinical Measurements: Goal: Ability to maintain clinical measurements within normal limits will improve Outcome: Progressing Goal: Will remain free from infection Outcome: Progressing Goal: Diagnostic test results will improve Outcome: Progressing Goal: Respiratory complications will improve Outcome: Progressing Goal: Cardiovascular complication will be avoided Outcome: Progressing   Problem: Activity: Goal: Risk for activity intolerance will decrease Outcome: Progressing   Problem: Nutrition: Goal: Adequate nutrition will be maintained Outcome: Progressing   Problem: Coping: Goal: Level of anxiety will decrease Outcome: Progressing   Problem: Elimination: Goal: Will not experience complications related to bowel motility Outcome: Progressing Goal: Will not experience complications related to urinary retention Outcome: Progressing   Problem: Pain Managment: Goal: General experience of comfort will improve Outcome: Progressing   Problem: Safety: Goal: Ability to remain free from injury will improve Outcome: Progressing   Problem: Skin Integrity: Goal: Risk for impaired skin integrity will decrease Outcome: Progressing

## 2021-08-20 NOTE — Progress Notes (Signed)
PROGRESS NOTE    [name]  XTG:[id number] DOB: 06-Jun-1941 DOA: 08/17/2021 PCP: Joyice Faster, FNP    Brief Narrative:  80 year old female with a history of hypertension and hyperlipidemia, admitted to the hospital with left lower extremity pain and swelling.  Found to have acute DVT.  Started on anticoagulation.  Having difficulty walking due to significant pain and swelling.  Seen by physical therapy with recommendations for skilled nursing facility.  During hospital course, she did become acutely confused.  She is noted to have mild fever.  Work-up for infectious process/altered mental status underway.   Assessment & Plan:   Active Problems:   DVT (deep venous thrombosis) (HCC)   Hypokalemia   HTN (hypertension)   Hyperlipidemia   Acute DVT left lower extremity -No obvious cause, although if she does have metastatic disease and this would be a precipitating factor -Currently on IV heparin, but heparin levels noted to be subtherapeutic through the day -We will transition to therapeutic Lovenox -We will need at least 3 to 6 months of therapy -No evidence of PE on CT chest -Reviewed case with Dr. Carlis Abbott on-call for vascular surgery.  Thrombectomy not recommended in this case.  It was felt that she would do reasonably well with anticoagulation -Keep lower extremity elevated  Sinus tachycardia -Noted to have persistent tachycardia on admission -Heart rate remained tachycardic despite improvement of pain in her leg. 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 Delton Coombes, oncology who felt it was reasonable to have this worked up as an outpatient and follow-up  Hypertension -Holding HCTZ and lisinopril for now -Blood pressure currently normotensive  Hyperlipidemia -Continue statin  Difficulty with  ambulation -Seen by PT with recommendations for skilled nursing facility  Acute encephalopathy -Etiology is unclear -Discussed with patient's granddaughter, she reports that patient is normally functional at baseline, continues to drive and pays her own bills -She does note that patient has occasionally been forgetful lately, at times asking if other family members have seen her husband who has passed away -She may have some underlying mild cognitive impairment -Overall mental status appears to be waxing and waning -Checking UA/urine culture -Opiate medications have been discontinued -Continue to monitor for now  Fever -Noted to have low-grade fever today of 100.3 -Checking UA/urine culture -We will empirically start on Rocephin -Chest x-ray without evidence of pneumonia -Fever may also be related to DVT  DVT prophylaxis:   Therapeutic Lovenox  Code Status: Full code Family Communication: Updated patient's son over the phone 10/28 Disposition Plan: Status is: Inpatient  Remains inpatient appropriate because: Patient has altered mental status, fever and continued pain of her left lower extremity         Consultants:    Procedures:    Antimicrobials:  Ceftriaxone 10/27 > 10/29 Keflex 10/29 >   Subjective: Patient reports that her pain and swelling in left leg is better today.  She is able to move her leg more today.  She is still intermittently confused.  Objective: Vitals:   08/19/21 1814 08/19/21 2115 08/20/21 0415 08/20/21 1311  BP:  (!) 142/77 113/65 107/61  Pulse:  99 (!) 106 (!) 101  Resp:  18 19 19   Temp: 99.8 F (37.7 C) 98 F (36.7 C) 98.3 F (36.8 C) 98.2 F (36.8 C)  TempSrc:    Oral  SpO2:  99% 94%  100%  Weight:      Height:        Intake/Output Summary (Last 24 hours) at 08/20/2021 1858 Last data filed at 08/20/2021 1500 Gross per 24 hour  Intake 1453.97 ml  Output --  Net 1453.97 ml   Filed Weights   08/17/21 0903 08/17/21 1330  08/18/21 0157  Weight: 121.1 kg 75.2 kg 83.7 kg    Examination:  General exam: Appears calm and comfortable  Respiratory system: Clear to auscultation. Respiratory effort normal. Cardiovascular system: S1 & S2 heard, RRR. No JVD, murmurs, rubs, gallops or clicks. No pedal edema. Gastrointestinal system: Abdomen is nondistended, soft and nontender. No organomegaly or masses felt. Normal bowel sounds heard. Central nervous system: Alert and oriented. No focal neurological deficits. Extremities: pain an swelling in her left leg improving Skin: No rashes, lesions or ulcers Psychiatry: intermittently confused    Data Reviewed: I have personally reviewed following labs and imaging studies  CBC: Recent Labs  Lab 08/17/21 0932 08/18/21 0459 08/19/21 0416 08/20/21 0340  WBC 6.3 6.3 7.1 5.6  NEUTROABS 5.0  --   --   --   HGB 12.4 11.6* 11.5* 10.9*  HCT 38.4 37.6 35.8* 32.5*  MCV 85.1 89.3 85.0 84.4  PLT 239 208 245 001   Basic Metabolic Panel: Recent Labs  Lab 08/17/21 0932 08/18/21 0459 08/19/21 0416 08/20/21 0340  NA 135 134* 135 134*  K 3.1* 3.9 3.8 3.5  CL 98 101 100 101  CO2 28 24 27 26   GLUCOSE 152* 112* 116* 104*  BUN 23 18 17 18   CREATININE 0.68 0.56 0.52 0.56  CALCIUM 9.0 8.6* 8.7* 8.5*  MG 2.1  --   --   --    GFR: Estimated Creatinine Clearance: 67 mL/min (by C-G formula based on SCr of 0.56 mg/dL). Liver Function Tests: Recent Labs  Lab 08/17/21 0932 08/19/21 0416 08/20/21 0340  AST 19 26 24   ALT 15 24 27   ALKPHOS 58 55 51  BILITOT 0.9 0.8 0.4  PROT 7.3 6.6 6.1*  ALBUMIN 3.4* 2.7* 2.4*   No results for input(s): LIPASE, AMYLASE in the last 168 hours. Recent Labs  Lab 08/20/21 0340  AMMONIA 31   Coagulation Profile: No results for input(s): INR, PROTIME in the last 168 hours. Cardiac Enzymes: No results for input(s): CKTOTAL, CKMB, CKMBINDEX, TROPONINI in the last 168 hours. BNP (last 3 results) No results for input(s): PROBNP in the last  8760 hours. HbA1C: No results for input(s): HGBA1C in the last 72 hours. CBG: No results for input(s): GLUCAP in the last 168 hours. Lipid Profile: No results for input(s): CHOL, HDL, LDLCALC, TRIG, CHOLHDL, LDLDIRECT in the last 72 hours. Thyroid Function Tests: Recent Labs    08/19/21 0416  TSH 0.782   Anemia Panel: No results for input(s): VITAMINB12, FOLATE, FERRITIN, TIBC, IRON, RETICCTPCT in the last 72 hours. Sepsis Labs: Recent Labs  Lab 08/17/21 0933  LATICACIDVEN 1.3    Recent Results (from the past 240 hour(s))  Resp Panel by RT-PCR (Flu A&B, Covid) Nasopharyngeal Swab     Status: None   Collection Time: 08/17/21  1:17 PM   Specimen: Nasopharyngeal Swab; Nasopharyngeal(NP) swabs in vial transport medium  Result Value Ref Range Status   SARS Coronavirus 2 by RT PCR NEGATIVE NEGATIVE Final    Comment: (NOTE) SARS-CoV-2 target nucleic acids are NOT DETECTED.  The SARS-CoV-2 RNA is generally detectable in upper respiratory specimens during the acute phase of infection. The lowest concentration of SARS-CoV-2  viral copies this assay can detect is 138 copies/mL. A negative result does not preclude SARS-Cov-2 infection and should not be used as the sole basis for treatment or other patient management decisions. A negative result may occur with  improper specimen collection/handling, submission of specimen other than nasopharyngeal swab, presence of viral mutation(s) within the areas targeted by this assay, and inadequate number of viral copies(<138 copies/mL). A negative result must be combined with clinical observations, patient history, and epidemiological information. The expected result is Negative.  Fact Sheet for Patients:  EntrepreneurPulse.com.au  Fact Sheet for Healthcare Providers:  IncredibleEmployment.be  This test is no t yet approved or cleared by the [place] FDA and  has been authorized for detection and/or  diagnosis of SARS-CoV-2 by FDA under an Emergency Use Authorization (EUA). This EUA will remain  in effect (meaning this test can be used) for the duration of the COVID-19 declaration under Section 564(b)(1) of the Act, 21 U.S.C.section 360bbb-3(b)(1), unless the authorization is terminated  or revoked sooner.       Influenza A by PCR NEGATIVE NEGATIVE Final   Influenza B by PCR NEGATIVE NEGATIVE Final    Comment: (NOTE) The Xpert Xpress SARS-CoV-2/FLU/RSV plus assay is intended as an aid in the diagnosis of influenza from Nasopharyngeal swab specimens and should not be used as a sole basis for treatment. Nasal washings and aspirates are unacceptable for Xpert Xpress SARS-CoV-2/FLU/RSV testing.  Fact Sheet for Patients: EntrepreneurPulse.com.au  Fact Sheet for Healthcare Providers: IncredibleEmployment.be  This test is not yet approved or cleared by the [place] FDA and has been authorized for detection and/or diagnosis of SARS-CoV-2 by FDA under an Emergency Use Authorization (EUA). This EUA will remain in effect (meaning this test can be used) for the duration of the COVID-19 declaration under Section 564(b)(1) of the Act, 21 U.S.C. section 360bbb-3(b)(1), unless the authorization is terminated or revoked.  Performed at [facility], [address], [place], [place] [address]   Urine Culture     Status: Abnormal   Collection Time: 08/18/21 12:40 PM   Specimen: Urine, Clean Catch  Result Value Ref Range Status   Specimen Description   Final    URINE, CLEAN CATCH Performed at [facility], [address], [place], [place] [address]    Special Requests   Final    NONE Performed at [facility], [address] [place],  [address]    Culture MULTIPLE SPECIES PRESENT, SUGGEST RECOLLECTION (A)  Final   Report Status 08/19/2021 FINAL  Final         Radiology Studies: ECHOCARDIOGRAM COMPLETE  Result Date: 08/19/2021     ECHOCARDIOGRAM REPORT   Patient Name:   [name] Date of Exam: 08/19/2021 Medical Rec #:  [id number]       Height:       69.0 in Accession #:    [id number]      Weight:       184.5 lb Date of Birth:  Oct 29, 1940      BSA:          1.996 m Patient Age:    46 years        BP:           120/73 mmHg Patient Gender: F               HR:           84 bpm. Exam Location:  [facility] Procedure: 2D Echo, Cardiac Doppler and Color Doppler  Indications:    Abnormal ECG  History:        Patient has no prior history of Echocardiogram examinations.                 Risk Factors:Hypertension and Dyslipidemia.  Sonographer:    Wenda Low Referring Phys: [place]  1. Left ventricular ejection fraction, by estimation, is 60 to 65%. The left ventricle has normal function. The left ventricle has no regional wall motion abnormalities. There is severe left ventricular hypertrophy of the basal-septal segment. Left ventricular diastolic parameters are consistent with Grade I diastolic dysfunction (impaired relaxation).  2. Right ventricular systolic function is normal. The right ventricular size is normal. There is normal pulmonary artery systolic pressure.  3. The mitral valve is normal in structure. Mild mitral valve regurgitation. No evidence of mitral stenosis.  4. The aortic valve was not well visualized. Aortic valve regurgitation is not visualized. No aortic stenosis is present.  5. The inferior vena cava is normal in size with greater than 50% respiratory variability, suggesting right atrial pressure of 3 mmHg. FINDINGS  Left Ventricle: Severe basal septal hypertrophy, basal septum measures 1.9 cm. Left ventricular ejection fraction, by estimation, is 60 to 65%. The left ventricle has normal function. The left ventricle has no regional wall motion abnormalities. The left ventricular internal cavity size was normal in size. There is severe left ventricular hypertrophy of the basal-septal segment. Left  ventricular diastolic parameters are consistent with Grade I diastolic dysfunction (impaired relaxation). Right Ventricle: The right ventricular size is normal. No increase in right ventricular wall thickness. Right ventricular systolic function is normal. There is normal pulmonary artery systolic pressure. The tricuspid regurgitant velocity is 2.25 m/s, and  with an assumed right atrial pressure of 3 mmHg, the estimated right ventricular systolic pressure is 66.0 mmHg. Left Atrium: Left atrial size was normal in size. Right Atrium: Right atrial size was normal in size. Pericardium: There is no evidence of pericardial effusion. Mitral Valve: The mitral valve is normal in structure. Mild mitral valve regurgitation. No evidence of mitral valve stenosis. MV peak gradient, 7.1 mmHg. The mean mitral valve gradient is 3.0 mmHg. Tricuspid Valve: The tricuspid valve is normal in structure. Tricuspid valve regurgitation is mild . No evidence of tricuspid stenosis. Aortic Valve: The aortic valve was not well visualized. Aortic valve regurgitation is not visualized. No aortic stenosis is present. Aortic valve mean gradient measures 7.0 mmHg. Aortic valve peak gradient measures 13.1 mmHg. Aortic valve area, by VTI measures 1.68 cm. Pulmonic Valve: The pulmonic valve was not well visualized. Pulmonic valve regurgitation is not visualized. No evidence of pulmonic stenosis. Aorta: The aortic root is normal in size and structure. Venous: The inferior vena cava is normal in size with greater than 50% respiratory variability, suggesting right atrial pressure of 3 mmHg. IAS/Shunts: No atrial level shunt detected by color flow Doppler.  LEFT VENTRICLE PLAX 2D LVIDd:         3.40 cm     Diastology LVIDs:         2.30 cm     LV e' medial:    8.05 cm/s LV PW:         0.80 cm     LV E/e' medial:  7.2 LV IVS:        1.30 cm     LV e' lateral:   7.94 cm/s LVOT diam:     1.90 cm     LV E/e' lateral: 7.3  LV SV:         53 LV SV Index:   27  LVOT Area:     2.84 cm  LV Volumes (MOD) LV vol d, MOD A2C: 38.1 ml LV vol d, MOD A4C: 46.6 ml LV vol s, MOD A2C: 14.3 ml LV vol s, MOD A4C: 16.8 ml LV SV MOD A2C:     23.8 ml LV SV MOD A4C:     46.6 ml LV SV MOD BP:      26.5 ml RIGHT VENTRICLE RV Basal diam:  3.30 cm RV Mid diam:    2.90 cm TAPSE (M-mode): 2.2 cm LEFT ATRIUM             Index        RIGHT ATRIUM           Index LA diam:        3.10 cm 1.55 cm/m   RA Area:     14.20 cm LA Vol (A2C):   69.5 ml 34.81 ml/m  RA Volume:   35.80 ml  17.93 ml/m LA Vol (A4C):   44.4 ml 22.24 ml/m LA Biplane Vol: 59.6 ml 29.85 ml/m  AORTIC VALVE AV Area (Vmax):    1.43 cm AV Area (Vmean):   1.38 cm AV Area (VTI):     1.68 cm AV Vmax:           181.00 cm/s AV Vmean:          124.000 cm/s AV VTI:            0.317 m AV Peak Grad:      13.1 mmHg AV Mean Grad:      7.0 mmHg LVOT Vmax:         91.40 cm/s LVOT Vmean:        60.400 cm/s LVOT VTI:          0.188 m LVOT/AV VTI ratio: 0.59  AORTA Ao Root diam: 2.40 cm MITRAL VALVE                TRICUSPID VALVE MV Area (PHT): 4.49 cm     TR Peak grad:   20.2 mmHg MV Area VTI:   2.48 cm     TR Vmax:        225.00 cm/s MV Peak grad:  7.1 mmHg MV Mean grad:  3.0 mmHg     SHUNTS MV Vmax:       1.33 m/s     Systemic VTI:  0.19 m MV Vmean:      70.9 cm/s    Systemic Diam: 1.90 cm MV Decel Time: 169 msec MV E velocity: 57.80 cm/s MV A velocity: 118.00 cm/s MV E/A ratio:  0.49 Carlyle Dolly MD Electronically signed by Carlyle Dolly MD Signature Date/Time: 08/19/2021/3:17:17 PM    Final         Scheduled Meds:  cephALEXin  500 mg Oral BID   enoxaparin (LOVENOX) injection  80 mg Subcutaneous Q12H   ketorolac  15 mg Intravenous Q6H   metoprolol tartrate  25 mg Oral BID   potassium chloride  40 mEq Oral Once   pravastatin  20 mg Oral q1800   QUEtiapine  25 mg Oral QHS   Continuous Infusions:  lactated ringers 75 mL/hr at 08/20/21 0608     LOS: 2 days    Time spent: 32mins    Kathie Dike, MD [facility]   If 7PM-7AM, please contact night-coverage [email or web address]  08/20/2021, 6:58 PM

## 2021-08-20 NOTE — Plan of Care (Signed)
Problem: Education: Goal: Knowledge of General Education information will improve Description: Including pain rating scale, medication(s)/side effects and non-pharmacologic comfort measures 08/20/2021 2305 by Tobe Sos, RN Outcome: Progressing 08/20/2021 2305 by Tobe Sos, RN Outcome: Progressing   Problem: Health Behavior/Discharge Planning: Goal: Ability to manage health-related needs will improve 08/20/2021 2305 by Tobe Sos, RN Outcome: Progressing 08/20/2021 2305 by Tobe Sos, RN Outcome: Progressing   Problem: Clinical Measurements: Goal: Ability to maintain clinical measurements within normal limits will improve 08/20/2021 2305 by Tobe Sos, RN Outcome: Progressing 08/20/2021 2305 by Tobe Sos, RN Outcome: Progressing Goal: Will remain free from infection 08/20/2021 2305 by Tobe Sos, RN Outcome: Progressing 08/20/2021 2305 by Tobe Sos, RN Outcome: Progressing Goal: Diagnostic test results will improve 08/20/2021 2305 by Tobe Sos, RN Outcome: Progressing 08/20/2021 2305 by Tobe Sos, RN Outcome: Progressing Goal: Respiratory complications will improve 08/20/2021 2305 by Tobe Sos, RN Outcome: Progressing 08/20/2021 2305 by Tobe Sos, RN Outcome: Progressing Goal: Cardiovascular complication will be avoided 08/20/2021 2305 by Tobe Sos, RN Outcome: Progressing 08/20/2021 2305 by Tobe Sos, RN Outcome: Progressing   Problem: Activity: Goal: Risk for activity intolerance will decrease 08/20/2021 2305 by Tobe Sos, RN Outcome: Progressing 08/20/2021 2305 by Tobe Sos, RN Outcome: Progressing   Problem: Nutrition: Goal: Adequate nutrition will be maintained 08/20/2021 2305 by Tobe Sos, RN Outcome: Progressing 08/20/2021 2305 by Tobe Sos, RN Outcome: Progressing   Problem: Coping: Goal: Level  of anxiety will decrease 08/20/2021 2305 by Tobe Sos, RN Outcome: Progressing 08/20/2021 2305 by Tobe Sos, RN Outcome: Progressing   Problem: Elimination: Goal: Will not experience complications related to bowel motility 08/20/2021 2305 by Tobe Sos, RN Outcome: Progressing 08/20/2021 2305 by Tobe Sos, RN Outcome: Progressing Goal: Will not experience complications related to urinary retention 08/20/2021 2305 by Tobe Sos, RN Outcome: Progressing 08/20/2021 2305 by Tobe Sos, RN Outcome: Progressing   Problem: Pain Managment: Goal: General experience of comfort will improve 08/20/2021 2305 by Tobe Sos, RN Outcome: Progressing 08/20/2021 2305 by Tobe Sos, RN Outcome: Progressing   Problem: Safety: Goal: Ability to remain free from injury will improve 08/20/2021 2305 by Tobe Sos, RN Outcome: Progressing 08/20/2021 2305 by Tobe Sos, RN Outcome: Progressing   Problem: Skin Integrity: Goal: Risk for impaired skin integrity will decrease 08/20/2021 2305 by Tobe Sos, RN Outcome: Progressing 08/20/2021 2305 by Tobe Sos, RN Outcome: Progressing   Problem: Safety: Goal: Non-violent Restraint(s) 08/20/2021 2305 by Tobe Sos, RN Outcome: Progressing 08/20/2021 2305 by Tobe Sos, RN Outcome: Progressing   Problem: Education: Goal: Knowledge of General Education information will improve Description: Including pain rating scale, medication(s)/side effects and non-pharmacologic comfort measures 08/20/2021 2305 by Tobe Sos, RN Outcome: Progressing 08/20/2021 2305 by Tobe Sos, RN Outcome: Progressing   Problem: Health Behavior/Discharge Planning: Goal: Ability to manage health-related needs will improve 08/20/2021 2305 by Tobe Sos, RN Outcome: Progressing 08/20/2021 2305 by Tobe Sos, RN Outcome:  Progressing   Problem: Clinical Measurements: Goal: Ability to maintain clinical measurements within normal limits will improve 08/20/2021 2305 by Tobe Sos, RN Outcome: Progressing 08/20/2021 2305 by Tobe Sos, RN Outcome: Progressing Goal: Will remain free from infection 08/20/2021 2305 by Tobe Sos, RN Outcome: Progressing 08/20/2021 2305 by Tobe Sos, RN Outcome: Progressing Goal: Diagnostic test results will improve 08/20/2021  2305 by Tobe Sos, RN Outcome: Progressing 08/20/2021 2305 by Tobe Sos, RN Outcome: Progressing Goal: Respiratory complications will improve 08/20/2021 2305 by Tobe Sos, RN Outcome: Progressing 08/20/2021 2305 by Tobe Sos, RN Outcome: Progressing Goal: Cardiovascular complication will be avoided 08/20/2021 2305 by Tobe Sos, RN Outcome: Progressing 08/20/2021 2305 by Tobe Sos, RN Outcome: Progressing   Problem: Activity: Goal: Risk for activity intolerance will decrease 08/20/2021 2305 by Tobe Sos, RN Outcome: Progressing 08/20/2021 2305 by Tobe Sos, RN Outcome: Progressing   Problem: Nutrition: Goal: Adequate nutrition will be maintained 08/20/2021 2305 by Tobe Sos, RN Outcome: Progressing 08/20/2021 2305 by Tobe Sos, RN Outcome: Progressing   Problem: Coping: Goal: Level of anxiety will decrease 08/20/2021 2305 by Tobe Sos, RN Outcome: Progressing 08/20/2021 2305 by Tobe Sos, RN Outcome: Progressing   Problem: Elimination: Goal: Will not experience complications related to bowel motility 08/20/2021 2305 by Tobe Sos, RN Outcome: Progressing 08/20/2021 2305 by Tobe Sos, RN Outcome: Progressing Goal: Will not experience complications related to urinary retention 08/20/2021 2305 by Tobe Sos, RN Outcome: Progressing 08/20/2021 2305 by Tobe Sos, RN Outcome: Progressing   Problem: Pain Managment: Goal: General experience of comfort will improve 08/20/2021 2305 by Tobe Sos, RN Outcome: Progressing 08/20/2021 2305 by Tobe Sos, RN Outcome: Progressing   Problem: Safety: Goal: Ability to remain free from injury will improve 08/20/2021 2305 by Tobe Sos, RN Outcome: Progressing 08/20/2021 2305 by Tobe Sos, RN Outcome: Progressing   Problem: Skin Integrity: Goal: Risk for impaired skin integrity will decrease 08/20/2021 2305 by Tobe Sos, RN Outcome: Progressing 08/20/2021 2305 by Tobe Sos, RN Outcome: Progressing

## 2021-08-21 DIAGNOSIS — R509 Fever, unspecified: Secondary | ICD-10-CM | POA: Diagnosis not present

## 2021-08-21 DIAGNOSIS — I10 Essential (primary) hypertension: Secondary | ICD-10-CM | POA: Diagnosis not present

## 2021-08-21 DIAGNOSIS — I82492 Acute embolism and thrombosis of other specified deep vein of left lower extremity: Secondary | ICD-10-CM | POA: Diagnosis not present

## 2021-08-21 DIAGNOSIS — G9341 Metabolic encephalopathy: Secondary | ICD-10-CM | POA: Diagnosis not present

## 2021-08-21 LAB — CBC
HCT: 34.2 % — ABNORMAL LOW (ref 36.0–46.0)
Hemoglobin: 10.9 g/dL — ABNORMAL LOW (ref 12.0–15.0)
MCH: 27.8 pg (ref 26.0–34.0)
MCHC: 31.9 g/dL (ref 30.0–36.0)
MCV: 87.2 fL (ref 80.0–100.0)
Platelets: 257 10*3/uL (ref 150–400)
RBC: 3.92 MIL/uL (ref 3.87–5.11)
RDW: 13.2 % (ref 11.5–15.5)
WBC: 4.7 10*3/uL (ref 4.0–10.5)
nRBC: 0 % (ref 0.0–0.2)

## 2021-08-21 MED ORDER — APIXABAN 5 MG PO TABS: 5.0000 mg | ORAL_TABLET | Freq: Two times a day (BID) | ORAL | Status: DC

## 2021-08-21 MED ORDER — APIXABAN 5 MG PO TABS
10.0000 mg | ORAL_TABLET | Freq: Two times a day (BID) | ORAL | Status: DC
  Administered 2021-08-21 – 2021-08-23 (×4): 10 mg via ORAL
  Filled 2021-08-21 (×4): qty 2

## 2021-08-21 NOTE — Progress Notes (Signed)
ANTICOAGULATION CONSULT NOTE - follow up consult   Pharmacy Consult for enoxaparin Indication: DVT  No Known Allergies  Patient Measurements: Height: 5\' 9"  (175.3 cm) Weight: 83.7 kg (184 lb 8.4 oz) IBW/kg (Calculated) : 66.2 Heparin Dosing Weight: HEPARIN DW (KG): 83   Vital Signs: Temp: 98.3 F (36.8 C) (10/30 0441) BP: 120/75 (10/30 0441) Pulse Rate: 101 (10/30 0441)  Labs: Recent Labs    08/18/21 0829 08/18/21 1706 08/19/21 0416 08/19/21 0416 08/20/21 0340 08/21/21 0423  HGB  --   --  11.5*   < > 10.9* 10.9*  HCT  --   --  35.8*  --  32.5* 34.2*  PLT  --   --  245  --  252 257  HEPARINUNFRC 0.19* <0.10*  --   --   --   --   CREATININE  --   --  0.52  --  0.56  --    < > = values in this interval not displayed.    Estimated Creatinine Clearance: 67 mL/min (by C-G formula based on SCr of 0.56 mg/dL).   Medical History: Past Medical History:  Diagnosis Date   Arthritis    Depression    HTN (hypertension)    Hypercholesteremia    S/P colonoscopy 2009   adenoma, repeat 5 years    Medications:  Medications Prior to Admission  Medication Sig Dispense Refill Last Dose   acetaminophen (TYLENOL) 325 MG tablet Take 650 mg by mouth every 6 (six) hours as needed for mild pain or moderate pain.   PRN   cetirizine (ZYRTEC) 10 MG tablet Take 10 mg by mouth daily.   08/16/2021   diphenhydramine-acetaminophen (TYLENOL PM) 25-500 MG TABS tablet Take 1 tablet by mouth at bedtime as needed (for sleep).   PRN   hydrochlorothiazide 25 MG tablet Take 25 mg by mouth daily.     08/16/2021   lisinopril (PRINIVIL,ZESTRIL) 5 MG tablet Take 5 mg by mouth daily.     08/16/2021   lovastatin (MEVACOR) 20 MG tablet Take 20 mg by mouth at bedtime.     08/16/2021   Vitamin D, Ergocalciferol, (DRISDOL) 1.25 MG (50000 UNIT) CAPS capsule Take 50,000 Units by mouth once a week.   Past Week   ondansetron (ZOFRAN) 4 MG tablet Take 1 tablet (4 mg total) by mouth every 6 (six) hours. (Patient  not taking: No sig reported) 8 tablet 0 Not Taking   Scheduled:   cephALEXin  500 mg Oral BID   enoxaparin (LOVENOX) injection  80 mg Subcutaneous Q12H   ketorolac  15 mg Intravenous Q6H   metoprolol tartrate  25 mg Oral BID   pravastatin  20 mg Oral q1800   QUEtiapine  25 mg Oral QHS   Infusions:   lactated ringers 75 mL/hr at 08/20/21 0608   PRN: acetaminophen **OR** acetaminophen, LORazepam, ondansetron **OR** ondansetron (ZOFRAN) IV Anti-infectives (From admission, onward)    Start     Dose/Rate Route Frequency Ordered Stop   08/21/21 1000  cephALEXin (KEFLEX) capsule 500 mg        500 mg Oral 2 times daily 08/20/21 1858     08/18/21 1645  cefTRIAXone (ROCEPHIN) 1 g in sodium chloride 0.9 % 100 mL IVPB  Status:  Discontinued        1 g 200 mL/hr over 30 Minutes Intravenous Every 24 hours 08/18/21 1549 08/20/21 1858       Assessment: [name] a 80 y.o. female requires anticoagulation with a heparin  iv infusion transitioned to enoxaparin for the indication of  DVT.   Goal of Therapy:  Anti-Xa level 0.6-1 units/ml 4hrs after LMWH dose given Monitor platelets by anticoagulation protocol: Yes   Plan:  Lovenox 80mg  subq q12h  Monitor for signs and symptoms of bleeding CBC MWF   [name] 08/21/2021,7:56 AM

## 2021-08-21 NOTE — Progress Notes (Signed)
ANTICOAGULATION CONSULT NOTE - Initial Consult  Pharmacy Consult for apixaban dosing Indication: DVT  No Known Allergies  Patient Measurements: Height: 5\' 9"  (175.3 cm) Weight: 83.7 kg (184 lb 8.4 oz) IBW/kg (Calculated) : 66.2   Vital Signs: Temp: 98.3 F (36.8 C) (10/30 0441) BP: 120/75 (10/30 0441) Pulse Rate: 101 (10/30 0441)  Labs: Recent Labs    08/18/21 1706 08/19/21 0416 08/19/21 0416 08/20/21 0340 08/21/21 0423  HGB  --  11.5*   < > 10.9* 10.9*  HCT  --  35.8*  --  32.5* 34.2*  PLT  --  245  --  252 257  HEPARINUNFRC <0.10*  --   --   --   --   CREATININE  --  0.52  --  0.56  --    < > = values in this interval not displayed.    Estimated Creatinine Clearance: 67 mL/min (by C-G formula based on SCr of 0.56 mg/dL). Lab Results  Component Value Date   CREATININE 0.56 08/20/2021   CREATININE 0.52 08/19/2021   CREATININE 0.56 08/18/2021   Filed Weights   08/17/21 7408 08/17/21 1330 08/18/21 0157  Weight: 121.1 kg (267 lb) 75.2 kg (165 lb 12.8 oz) 83.7 kg (184 lb 8.4 oz)     Medical History: Past Medical History:  Diagnosis Date   Arthritis    Depression    HTN (hypertension)    Hypercholesteremia    S/P colonoscopy 2009   adenoma, repeat 5 years    Medications:  Medications Prior to Admission  Medication Sig Dispense Refill Last Dose   acetaminophen (TYLENOL) 325 MG tablet Take 650 mg by mouth every 6 (six) hours as needed for mild pain or moderate pain.   PRN   cetirizine (ZYRTEC) 10 MG tablet Take 10 mg by mouth daily.   08/16/2021   diphenhydramine-acetaminophen (TYLENOL PM) 25-500 MG TABS tablet Take 1 tablet by mouth at bedtime as needed (for sleep).   PRN   hydrochlorothiazide 25 MG tablet Take 25 mg by mouth daily.     08/16/2021   lisinopril (PRINIVIL,ZESTRIL) 5 MG tablet Take 5 mg by mouth daily.     08/16/2021   lovastatin (MEVACOR) 20 MG tablet Take 20 mg by mouth at bedtime.     08/16/2021   Vitamin D, Ergocalciferol, (DRISDOL) 1.25  MG (50000 UNIT) CAPS capsule Take 50,000 Units by mouth once a week.   Past Week   ondansetron (ZOFRAN) 4 MG tablet Take 1 tablet (4 mg total) by mouth every 6 (six) hours. (Patient not taking: No sig reported) 8 tablet 0 Not Taking   Scheduled:   apixaban  10 mg Oral BID   Followed by   Derrill Memo ON 08/28/2021] apixaban  5 mg Oral BID   cephALEXin  500 mg Oral BID   ketorolac  15 mg Intravenous Q6H   metoprolol tartrate  25 mg Oral BID   pravastatin  20 mg Oral q1800   QUEtiapine  25 mg Oral QHS   Infusions:   lactated ringers 75 mL/hr at 08/20/21 0608   PRN: acetaminophen **OR** acetaminophen, LORazepam, ondansetron **OR** ondansetron (ZOFRAN) IV Anti-infectives (From admission, onward)    Start     Dose/Rate Route Frequency Ordered Stop   08/21/21 1000  cephALEXin (KEFLEX) capsule 500 mg        500 mg Oral 2 times daily 08/20/21 1858     08/18/21 1645  cefTRIAXone (ROCEPHIN) 1 g in sodium chloride 0.9 % 100 mL IVPB  Status:  Discontinued        1 g 200 mL/hr over 30 Minutes Intravenous Every 24 hours 08/18/21 1549 08/20/21 1858       Assessment: [name] a 80 y.o. female requires anticoagulation with apixaban for the indication of  DVT. Apixban will be started following pharmacy protocol per pharmacy consult.   Goal of Therapy:  Patient to be properly anticoagulated with apixaban Monitor platelets by anticoagulation protocol: yes   Plan:  Apixban 10mg  po BID x 7 days, followed by apixaban 5mg  po BID, duration of therapy to be determined by following physician  CBC MWF Monitor for signs and symptoms of bleeding   Thomasenia Sales, PharmD Clinical Pharmacist 08/21/2021,10:19 AM

## 2021-08-21 NOTE — Progress Notes (Signed)
PROGRESS NOTE    REVELLA [name]  ZYY:[id number] DOB: August 03, 1941 DOA: 08/17/2021 PCP: Joyice Faster, FNP    Brief Narrative:  80 year old female with a history of hypertension and hyperlipidemia, admitted to the hospital with left lower extremity pain and swelling.  Found to have acute DVT.  Started on anticoagulation.  Having difficulty walking due to significant pain and swelling.  Seen by physical therapy with recommendations for skilled nursing facility.  During hospital course, she did become acutely confused.  She is noted to have mild fever.  Work-up for infectious process/altered mental status underway.   Assessment & Plan:   Active Problems:   DVT (deep venous thrombosis) (HCC)   Hypokalemia   HTN (hypertension)   Hyperlipidemia   Acute DVT left lower extremity -No obvious cause, although if she does have metastatic disease and this would be a precipitating factor -She is being anticoagulated with Eliquis -We will need at least 3 to 6 months of therapy -No evidence of PE on CT chest -Reviewed case with Dr. Carlis Abbott on-call for vascular surgery.  Thrombectomy not recommended in this case.  It was felt that she would do reasonably well with anticoagulation -Keep lower extremity elevated  Sinus tachycardia -Noted to have persistent tachycardia on admission -Heart rate remained tachycardic despite improvement of pain in her leg. 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 Delton Coombes, oncology who felt it was reasonable to have this worked up as an outpatient and follow-up  Hypertension -Holding HCTZ and lisinopril for now -Blood pressure currently normotensive  Hyperlipidemia -Continue statin  Difficulty with ambulation -Seen by PT with recommendations for skilled nursing facility  Acute  encephalopathy -Etiology is unclear -Discussed with patient's granddaughter, she reports that patient is normally functional at baseline, continues to drive and pays her own bills -She does note that patient has occasionally been forgetful lately, at times asking if other family members have seen her husband who has passed away -She may have some underlying mild cognitive impairment -Overall mental status appears to be waxing and waning -Urine culture without any specific growth -Opiate medications have been discontinued -Continue to monitor for now   DVT prophylaxis:    apixaban (ELIQUIS) tablet 10 mg  apixaban (ELIQUIS) tablet 5 mg  Code Status: Full code Family Communication: Updated patient's daughter at the bedside 10/30 Disposition Plan: Status is: Inpatient  Remains inpatient appropriate because: She is overall improved.  Plans on discharge to skilled nursing facility in a.m.         Consultants:    Procedures:    Antimicrobials:  Ceftriaxone 10/27 > 10/29 Keflex 10/29 >   Subjective: Feels as if the pain in her leg is improving.  Objective: Vitals:   08/20/21 1311 08/21/21 0441 08/21/21 1253 08/21/21 2119  BP: 107/61 120/75 99/65 129/75  Pulse: (!) 101 (!) 101 91 (!) 102  Resp: 19 17 17 20   Temp: 98.2 F (36.8 C) 98.3 F (36.8 C) 98.2 F (36.8 C) 98 F (36.7 C)  TempSrc: Oral  Oral Oral  SpO2: 100% 99% 100% 98%  Weight:      Height:        Intake/Output Summary (Last 24 hours) at 08/21/2021 2131 Last data filed at 08/21/2021 1700 Gross per 24 hour  Intake 600 ml  Output 950 ml  Net -350 ml   [employer]  08/17/21 0903 08/17/21 1330 08/18/21 0157  Weight: 121.1 kg 75.2 kg 83.7 kg    Examination:  General exam: Appears calm and comfortable  Respiratory system: Clear to auscultation. Respiratory effort normal. Cardiovascular system: S1 & S2 heard, RRR. No JVD, murmurs, rubs, gallops or clicks. No pedal edema. Gastrointestinal system:  Abdomen is nondistended, soft and nontender. No organomegaly or masses felt. Normal bowel sounds heard. Central nervous system: Alert and oriented. No focal neurological deficits. Extremities: pain an swelling in her left leg improving Skin: No rashes, lesions or ulcers Psychiatry: intermittently confused    Data Reviewed: I have personally reviewed following labs and imaging studies  CBC: Recent Labs  Lab 08/17/21 0932 08/18/21 0459 08/19/21 0416 08/20/21 0340 08/21/21 0423  WBC 6.3 6.3 7.1 5.6 4.7  NEUTROABS 5.0  --   --   --   --   HGB 12.4 11.6* 11.5* 10.9* 10.9*  HCT 38.4 37.6 35.8* 32.5* 34.2*  MCV 85.1 89.3 85.0 84.4 87.2  PLT 239 208 245 252 505   Basic Metabolic Panel: Recent Labs  Lab 08/17/21 0932 08/18/21 0459 08/19/21 0416 08/20/21 0340  NA 135 134* 135 134*  K 3.1* 3.9 3.8 3.5  CL 98 101 100 101  CO2 28 24 27 26   GLUCOSE 152* 112* 116* 104*  BUN 23 18 17 18   CREATININE 0.68 0.56 0.52 0.56  CALCIUM 9.0 8.6* 8.7* 8.5*  MG 2.1  --   --   --    GFR: Estimated Creatinine Clearance: 67 mL/min (by C-G formula based on SCr of 0.56 mg/dL). Liver Function Tests: Recent Labs  Lab 08/17/21 0932 08/19/21 0416 08/20/21 0340  AST 19 26 24   ALT 15 24 27   ALKPHOS 58 55 51  BILITOT 0.9 0.8 0.4  PROT 7.3 6.6 6.1*  ALBUMIN 3.4* 2.7* 2.4*   No results for input(s): LIPASE, AMYLASE in the last 168 hours. Recent Labs  Lab 08/20/21 0340  AMMONIA 31   Coagulation Profile: No results for input(s): INR, PROTIME in the last 168 hours. Cardiac Enzymes: No results for input(s): CKTOTAL, CKMB, CKMBINDEX, TROPONINI in the last 168 hours. BNP (last 3 results) No results for input(s): PROBNP in the last 8760 hours. HbA1C: No results for input(s): HGBA1C in the last 72 hours. CBG: No results for input(s): GLUCAP in the last 168 hours. Lipid Profile: No results for input(s): CHOL, HDL, LDLCALC, TRIG, CHOLHDL, LDLDIRECT in the last 72 hours. Thyroid Function  Tests: Recent Labs    08/19/21 0416  TSH 0.782   Anemia Panel: No results for input(s): VITAMINB12, FOLATE, FERRITIN, TIBC, IRON, RETICCTPCT in the last 72 hours. Sepsis Labs: Recent Labs  Lab 08/17/21 0933  LATICACIDVEN 1.3    Recent Results (from the past 240 hour(s))  Resp Panel by RT-PCR (Flu A&B, Covid) Nasopharyngeal Swab     Status: None   Collection Time: 08/17/21  1:17 PM   Specimen: Nasopharyngeal Swab; Nasopharyngeal(NP) swabs in vial transport medium  Result Value Ref Range Status   SARS Coronavirus 2 by RT PCR NEGATIVE NEGATIVE Final    Comment: (NOTE) SARS-CoV-2 target nucleic acids are NOT DETECTED.  The SARS-CoV-2 RNA is generally detectable in upper respiratory specimens during the acute phase of infection. The lowest concentration of SARS-CoV-2 viral copies this assay can detect is 138 copies/mL. A negative result does not preclude SARS-Cov-2 infection and should not be used as the sole basis for treatment or other patient management decisions. A negative result may occur with  improper  specimen collection/handling, submission of specimen other than nasopharyngeal swab, presence of viral mutation(s) within the areas targeted by this assay, and inadequate number of viral copies(<138 copies/mL). A negative result must be combined with clinical observations, patient history, and epidemiological information. The expected result is Negative.  Fact Sheet for Patients:  EntrepreneurPulse.com.au  Fact Sheet for Healthcare Providers:  IncredibleEmployment.be  This test is no t yet approved or cleared by the [place] FDA and  has been authorized for detection and/or diagnosis of SARS-CoV-2 by FDA under an Emergency Use Authorization (EUA). This EUA will remain  in effect (meaning this test can be used) for the duration of the COVID-19 declaration under Section 564(b)(1) of the Act, 21 U.S.C.section 360bbb-3(b)(1), unless  the authorization is terminated  or revoked sooner.       Influenza A by PCR NEGATIVE NEGATIVE Final   Influenza B by PCR NEGATIVE NEGATIVE Final    Comment: (NOTE) The Xpert Xpress SARS-CoV-2/FLU/RSV plus assay is intended as an aid in the diagnosis of influenza from Nasopharyngeal swab specimens and should not be used as a sole basis for treatment. Nasal washings and aspirates are unacceptable for Xpert Xpress SARS-CoV-2/FLU/RSV testing.  Fact Sheet for Patients: EntrepreneurPulse.com.au  Fact Sheet for Healthcare Providers: IncredibleEmployment.be  This test is not yet approved or cleared by the [place] FDA and has been authorized for detection and/or diagnosis of SARS-CoV-2 by FDA under an Emergency Use Authorization (EUA). This EUA will remain in effect (meaning this test can be used) for the duration of the COVID-19 declaration under Section 564(b)(1) of the Act, 21 U.S.C. section 360bbb-3(b)(1), unless the authorization is terminated or revoked.  Performed at [facility], [address], [place], [place] [address]   Urine Culture     Status: Abnormal   Collection Time: 08/18/21 12:40 PM   Specimen: Urine, Clean Catch  Result Value Ref Range Status   Specimen Description   Final    URINE, CLEAN CATCH Performed at [facility], [address] [place], [place] [address]    Special Requests   Final    NONE Performed at [facility], [address] [place], [place] [address]    Culture MULTIPLE SPECIES PRESENT, SUGGEST RECOLLECTION (A)  Final   Report Status 08/19/2021 FINAL  Final         Radiology Studies: No results found.      Scheduled Meds:  apixaban  10 mg Oral BID   Followed by   Derrill Memo ON 08/28/2021] apixaban  5 mg Oral BID   cephALEXin  500 mg Oral BID   metoprolol tartrate  25 mg Oral BID   pravastatin  20 mg Oral q1800   QUEtiapine  25 mg Oral QHS   Continuous Infusions:  lactated ringers 75 mL/hr  at 08/21/21 1731     LOS: 3 days    Time spent: 74mins    Kathie Dike, MD [facility]   If 7PM-7AM, please contact night-coverage [email or web address]  08/21/2021, 9:31 PM

## 2021-08-22 DIAGNOSIS — I10 Essential (primary) hypertension: Secondary | ICD-10-CM | POA: Diagnosis not present

## 2021-08-22 DIAGNOSIS — I82492 Acute embolism and thrombosis of other specified deep vein of left lower extremity: Secondary | ICD-10-CM | POA: Diagnosis not present

## 2021-08-22 DIAGNOSIS — E785 Hyperlipidemia, unspecified: Secondary | ICD-10-CM | POA: Diagnosis not present

## 2021-08-22 LAB — CBC
HCT: 32.9 % — ABNORMAL LOW (ref 36.0–46.0)
Hemoglobin: 10.5 g/dL — ABNORMAL LOW (ref 12.0–15.0)
MCH: 27.8 pg (ref 26.0–34.0)
MCHC: 31.9 g/dL (ref 30.0–36.0)
MCV: 87 fL (ref 80.0–100.0)
Platelets: 274 10*3/uL (ref 150–400)
RBC: 3.78 MIL/uL — ABNORMAL LOW (ref 3.87–5.11)
RDW: 13.2 % (ref 11.5–15.5)
WBC: 4.8 10*3/uL (ref 4.0–10.5)
nRBC: 0 % (ref 0.0–0.2)

## 2021-08-22 MED ORDER — METOPROLOL TARTRATE 25 MG PO TABS: 25.0000 mg | ORAL_TABLET | Freq: Two times a day (BID) | ORAL | Status: DC

## 2021-08-22 MED ORDER — APIXABAN 5 MG PO TABS: ORAL_TABLET | ORAL | Status: AC

## 2021-08-22 MED ORDER — QUETIAPINE FUMARATE 25 MG PO TABS: 25.0000 mg | ORAL_TABLET | Freq: Every day | ORAL | Status: DC

## 2021-08-22 NOTE — Discharge Summary (Signed)
Physician Discharge Summary  [name] KKX:[id number] DOB: 1941/10/22 DOA: 08/17/2021  PCP: Joyice Faster, FNP  Admit date: 08/17/2021 Discharge date: 08/22/2021  Admitted From: home Disposition:  SNF  Recommendations for Outpatient Follow-up:  Follow up with PCP in 1-2 weeks Please obtain BMP/CBC in one week Follow up with hem/onc Dr. Delton Coombes on 11/15 at 1:15pm for further evaluation for sternal sclerotic lesions Consider outpatient neurology referral for suspected mild cognitive impairment  Home Health: Equipment/Devices:  Discharge Condition:stable CODE STATUS: full code Diet recommendation: heart healthy  Brief/Interim Summary: 80 y/o female with history of hypertension, hyperlipidemia, admitted with LLE DVT. Started on anticoagluation. Overall swelling and pain have improved. Did have some confusion. Possible UTI noted. Suspect that she may have some mild cognitive impairment at baseline. Seen by PT with recommendations for SNF  Discharge Diagnoses:  Active Problems:   DVT (deep venous thrombosis) (HCC)   Hypokalemia   HTN (hypertension)   Hyperlipidemia  Acute DVT left lower extremity -No obvious cause, although if she does have metastatic disease, then this would be a precipitating factor -She is being anticoagulated with Eliquis -We will need at least 3 to 6 months of therapy -No evidence of PE on CT chest -Reviewed case with Dr. Carlis Abbott on-call for vascular surgery.  Thrombectomy not recommended in this case.  It was felt that she would do reasonably well with anticoagulation -Keep lower extremity elevated   Sinus tachycardia -Noted to have persistent tachycardia on admission -Heart rate remained tachycardic despite improvement of pain in her leg. -Started on beta-blocker with some improvement of heart rate -Echocardiogram and TSH unremarkable    Sclerotic lesions in the sternum -Noted on CT chest -Discussed with Dr. Delton Coombes, oncology who felt  it was reasonable to have this worked up as an outpatient and follow-up   Hypertension -Holding HCTZ and lisinopril for now -Blood pressure currently normotensive   Hyperlipidemia -Continue statin   Difficulty with ambulation -Seen by PT with recommendations for skilled nursing facility   Acute encephalopathy -Etiology is unclear -Discussed with patient's granddaughter, she reports that patient is normally functional at baseline, she continues to drive and pays her own bills -She does note that patient has occasionally been forgetful lately, at times asking if other family members have seen her husband (her husband passed away approximately a year ago) -She may have some underlying mild cognitive impairment -Urine culture with multiple species, she was completed a course of antibiotics  -Opiate medications have been discontinued -current mental status is stable, although she does seem to have more confusion in the evenings  Discharge Instructions  Discharge Instructions     Diet - low sodium heart healthy   Complete by: As directed    Increase activity slowly   Complete by: As directed       Allergies as of 08/22/2021   No Known Allergies      Medication List     STOP taking these medications    hydrochlorothiazide 25 MG tablet Commonly known as: HYDRODIURIL   lisinopril 5 MG tablet Commonly known as: ZESTRIL       TAKE these medications    acetaminophen 325 MG tablet Commonly known as: TYLENOL Take 650 mg by mouth every 6 (six) hours as needed for mild pain or moderate pain.   apixaban 5 MG Tabs tablet Commonly known as: ELIQUIS Take 10mg  po bid through 11/6 then take 5mg  po bid   cetirizine 10 MG tablet Commonly known as: ZYRTEC Take 10 mg  by mouth daily.   diphenhydramine-acetaminophen 25-500 MG Tabs tablet Commonly known as: TYLENOL PM Take 1 tablet by mouth at bedtime as needed (for sleep).   lovastatin 20 MG tablet Commonly known as:  MEVACOR Take 20 mg by mouth at bedtime.   metoprolol tartrate 25 MG tablet Commonly known as: LOPRESSOR Take 1 tablet (25 mg total) by mouth 2 (two) times daily.   ondansetron 4 MG tablet Commonly known as: ZOFRAN Take 1 tablet (4 mg total) by mouth every 6 (six) hours.   QUEtiapine 25 MG tablet Commonly known as: SEROQUEL Take 1 tablet (25 mg total) by mouth at bedtime.   Vitamin D (Ergocalciferol) 1.25 MG (50000 UNIT) Caps capsule Commonly known as: DRISDOL Take 50,000 Units by mouth once a week.        No Known Allergies  Consultations:    Procedures/Studies: CT Angio Chest PE W and/or Wo Contrast  Result Date: 08/17/2021 CLINICAL DATA:  Positive right DVT with tachycardia EXAM: CT ANGIOGRAPHY CHEST WITH CONTRAST TECHNIQUE: Multidetector CT imaging of the chest was performed using the standard protocol during bolus administration of intravenous contrast. Multiplanar CT image reconstructions and MIPs were obtained to evaluate the vascular anatomy. CONTRAST:  64mL OMNIPAQUE IOHEXOL 350 MG/ML SOLN COMPARISON:  None. FINDINGS: Cardiovascular: Satisfactory opacification of the pulmonary arteries to the segmental level. No evidence of pulmonary embolism. Pulmonary trunk measures 3.7 cm in diameter. Thoracic aorta is nonaneurysmal. Atherosclerotic calcifications of the aorta and coronary arteries. Heart size is upper limits of normal. No pericardial effusion. Mediastinum/Nodes: No axillary, mediastinal, or hilar lymphadenopathy. Multinodular thyroid gland with dominant nodule in the left thyroid lobe measuring at least 3.1 cm. Trachea and esophagus within normal limits. Lungs/Pleura: Lungs are clear. No pleural effusion or pneumothorax. Upper Abdomen: No acute abnormality. Musculoskeletal: Multiple areas of increased sclerosis within the sternum and manubrium. The posterior wall of the manubrium is ill-defined (series 8, images 75-78). No additional bony lesions are identified. No  fracture. Multilevel degenerative changes within the thoracic spine. No chest wall abnormality. Review of the MIP images confirms the above findings. IMPRESSION: 1. No evidence of acute pulmonary embolism. 2. Multiple areas of increased sclerosis within the sternum and manubrium. The posterior wall of the manubrium is ill-defined. Findings are suspicious for metastatic disease versus myeloma. MRI of the sternum without and with intravenous contrast is recommended for further evaluation. Nucleolar medicine bone scan could also be performed to assess for additional lesions. 3. Multinodular thyroid gland with dominant nodule in the left thyroid lobe measuring at least 3.1 cm. Recommend nonemergent thyroid US (ref: J Am Coll Radiol. 2015 Feb;12(2): 143-50). 4. Aortic and coronary artery atherosclerosis (ICD10-I70.0). Electronically Signed   By: Davina Poke D.O.   On: 08/17/2021 11:36   US Venous Img Lower Unilateral Left (DVT)  Result Date: 08/17/2021 CLINICAL DATA:  Left thigh and knee pain and edema. EXAM: LEFT LOWER EXTREMITY VENOUS DOPPLER ULTRASOUND TECHNIQUE: Gray-scale sonography with graded compression, as well as color Doppler and duplex ultrasound were performed to evaluate the lower extremity deep venous systems from the level of the common femoral vein and including the common femoral, femoral, profunda femoral, popliteal and calf veins including the posterior tibial, peroneal and gastrocnemius veins when visible. The superficial great saphenous vein was also interrogated. Spectral Doppler was utilized to evaluate flow at rest and with distal augmentation maneuvers in the common femoral, femoral and popliteal veins. COMPARISON:  None. FINDINGS: Contralateral Common Femoral Vein: Respiratory phasicity is normal and symmetric with the  symptomatic side. No evidence of thrombus. Normal compressibility. Common Femoral Vein: No evidence of thrombus. Normal compressibility, respiratory phasicity and  response to augmentation. Saphenofemoral Junction: No evidence of thrombus. Normal compressibility and flow on color Doppler imaging. Profunda Femoral Vein: No evidence of thrombus. Normal compressibility and flow on color Doppler imaging. Femoral Vein: Nonocclusive thrombus is seen throughout the course of the femoral vein in the left thigh. Popliteal Vein: No evidence of thrombus. Normal compressibility, respiratory phasicity and response to augmentation. Calf Veins: No thrombus identified in the visualized posterior tibial or peroneal veins. There is thrombus seen in a gastrocnemius/soleal vein. Superficial Great Saphenous Vein: No evidence of thrombus. Normal compressibility. Venous Reflux:  None. Other Findings: No evidence of superficial thrombophlebitis or abnormal fluid collection. IMPRESSION: Positive for left lower extremity DVT with nonocclusive thrombus seen throughout the left femoral vein and thrombus identified in a gastrocnemius/soleal vein. Electronically Signed   By: Aletta Edouard M.D.   On: 08/17/2021 10:22   DG CHEST PORT 1 VIEW  Result Date: 08/18/2021 CLINICAL DATA:  Fever EXAM: PORTABLE CHEST 1 VIEW COMPARISON:  08/17/2021 FINDINGS: Heart size is upper limits of normal. Aortic atherosclerosis. No focal airspace consolidation, pleural effusion, or pneumothorax. IMPRESSION: No active disease. Electronically Signed   By: Davina Poke D.O.   On: 08/18/2021 17:59   DG Knee Complete 4 Views Left  Result Date: 08/17/2021 CLINICAL DATA:  Reactive EXAM: LEFT KNEE - COMPLETE 4+ VIEW COMPARISON:  None. FINDINGS: No recent fracture is seen. There is no significant effusion in the suprapatellar bursa. Marked degenerative changes are noted in radial lateral patellofemoral compartments, more so in the patellofemoral compartment osteopenia is seen in both hips. There are no opaque foreign bodies. IMPRESSION: No recent fracture is seen. Marked degenerative changes are noted, more so in the  patellofemoral compartment. Osteopenia. Reading location: [place], [place]. Electronically Signed   By: Elmer Picker   On: 08/17/2021 10:57   ECHOCARDIOGRAM COMPLETE  Result Date: 08/19/2021    ECHOCARDIOGRAM REPORT   Patient Name:   TYLASIA FLETCHALL Date of Exam: 08/19/2021 Medical Rec #:  [id number]       Height:       69.0 in Accession #:    [id number]      Weight:       184.5 lb Date of Birth:  [birth year]      BSA:          1.996 m Patient Age:    66 years        BP:           120/73 mmHg Patient Gender: F               HR:           84 bpm. Exam Location:  [facility] Procedure: 2D Echo, Cardiac Doppler and Color Doppler Indications:    Abnormal ECG  History:        Patient has no prior history of Echocardiogram examinations.                 Risk Factors:Hypertension and Dyslipidemia.  Sonographer:    Wenda Low Referring Phys: [place]  1. Left ventricular ejection fraction, by estimation, is 60 to 65%. The left ventricle has normal function. The left ventricle has no regional wall motion abnormalities. There is severe left ventricular hypertrophy of the basal-septal segment. Left ventricular diastolic parameters are consistent with Grade I diastolic dysfunction (impaired relaxation).  2. Right ventricular  systolic function is normal. The right ventricular size is normal. There is normal pulmonary artery systolic pressure.  3. The mitral valve is normal in structure. Mild mitral valve regurgitation. No evidence of mitral stenosis.  4. The aortic valve was not well visualized. Aortic valve regurgitation is not visualized. No aortic stenosis is present.  5. The inferior vena cava is normal in size with greater than 50% respiratory variability, suggesting right atrial pressure of 3 mmHg. FINDINGS  Left Ventricle: Severe basal septal hypertrophy, basal septum measures 1.9 cm. Left ventricular ejection fraction, by estimation, is 60 to 65%. The left ventricle has normal  function. The left ventricle has no regional wall motion abnormalities. The left ventricular internal cavity size was normal in size. There is severe left ventricular hypertrophy of the basal-septal segment. Left ventricular diastolic parameters are consistent with Grade I diastolic dysfunction (impaired relaxation). Right Ventricle: The right ventricular size is normal. No increase in right ventricular wall thickness. Right ventricular systolic function is normal. There is normal pulmonary artery systolic pressure. The tricuspid regurgitant velocity is 2.25 m/s, and  with an assumed right atrial pressure of 3 mmHg, the estimated right ventricular systolic pressure is 75.1 mmHg. Left Atrium: Left atrial size was normal in size. Right Atrium: Right atrial size was normal in size. Pericardium: There is no evidence of pericardial effusion. Mitral Valve: The mitral valve is normal in structure. Mild mitral valve regurgitation. No evidence of mitral valve stenosis. MV peak gradient, 7.1 mmHg. The mean mitral valve gradient is 3.0 mmHg. Tricuspid Valve: The tricuspid valve is normal in structure. Tricuspid valve regurgitation is mild . No evidence of tricuspid stenosis. Aortic Valve: The aortic valve was not well visualized. Aortic valve regurgitation is not visualized. No aortic stenosis is present. Aortic valve mean gradient measures 7.0 mmHg. Aortic valve peak gradient measures 13.1 mmHg. Aortic valve area, by VTI measures 1.68 cm. Pulmonic Valve: The pulmonic valve was not well visualized. Pulmonic valve regurgitation is not visualized. No evidence of pulmonic stenosis. Aorta: The aortic root is normal in size and structure. Venous: The inferior vena cava is normal in size with greater than 50% respiratory variability, suggesting right atrial pressure of 3 mmHg. IAS/Shunts: No atrial level shunt detected by color flow Doppler.  LEFT VENTRICLE PLAX 2D LVIDd:         3.40 cm     Diastology LVIDs:         2.30 cm     LV  e' medial:    8.05 cm/s LV PW:         0.80 cm     LV E/e' medial:  7.2 LV IVS:        1.30 cm     LV e' lateral:   7.94 cm/s LVOT diam:     1.90 cm     LV E/e' lateral: 7.3 LV SV:         53 LV SV Index:   27 LVOT Area:     2.84 cm  LV Volumes (MOD) LV vol d, MOD A2C: 38.1 ml LV vol d, MOD A4C: 46.6 ml LV vol s, MOD A2C: 14.3 ml LV vol s, MOD A4C: 16.8 ml LV SV MOD A2C:     23.8 ml LV SV MOD A4C:     46.6 ml LV SV MOD BP:      26.5 ml RIGHT VENTRICLE RV Basal diam:  3.30 cm RV Mid diam:    2.90 cm TAPSE (M-mode): 2.2 cm LEFT  ATRIUM             Index        RIGHT ATRIUM           Index LA diam:        3.10 cm 1.55 cm/m   RA Area:     14.20 cm LA Vol (A2C):   69.5 ml 34.81 ml/m  RA Volume:   35.80 ml  17.93 ml/m LA Vol (A4C):   44.4 ml 22.24 ml/m LA Biplane Vol: 59.6 ml 29.85 ml/m  AORTIC VALVE AV Area (Vmax):    1.43 cm AV Area (Vmean):   1.38 cm AV Area (VTI):     1.68 cm AV Vmax:           181.00 cm/s AV Vmean:          124.000 cm/s AV VTI:            0.317 m AV Peak Grad:      13.1 mmHg AV Mean Grad:      7.0 mmHg LVOT Vmax:         91.40 cm/s LVOT Vmean:        60.400 cm/s LVOT VTI:          0.188 m LVOT/AV VTI ratio: 0.59  AORTA Ao Root diam: 2.40 cm MITRAL VALVE                TRICUSPID VALVE MV Area (PHT): 4.49 cm     TR Peak grad:   20.2 mmHg MV Area VTI:   2.48 cm     TR Vmax:        225.00 cm/s MV Peak grad:  7.1 mmHg MV Mean grad:  3.0 mmHg     SHUNTS MV Vmax:       1.33 m/s     Systemic VTI:  0.19 m MV Vmean:      70.9 cm/s    Systemic Diam: 1.90 cm MV Decel Time: 169 msec MV E velocity: 57.80 cm/s MV A velocity: 118.00 cm/s MV E/A ratio:  0.49 Carlyle Dolly MD Electronically signed by Carlyle Dolly MD Signature Date/Time: 08/19/2021/3:17:17 PM    Final       Subjective: Patient says pain is controlled, no new complaints  Discharge Exam: Vitals:   08/21/21 1253 08/21/21 2119 08/22/21 0531 08/22/21 1055  BP: 99/65 129/75 132/70 123/74  Pulse: 91 (!) 102 (!) 101 (!) 106  Resp:  17 20 18    Temp: 98.2 F (36.8 C) 98 F (36.7 C) 99.3 F (37.4 C)   TempSrc: Oral Oral Oral   SpO2: 100% 98% 97%   Weight:      Height:        General: Pt is alert, awake, not in acute distress Cardiovascular: RRR, S1/S2 +, no rubs, no gallops Respiratory: CTA bilaterally, no wheezing, no rhonchi Abdominal: Soft, NT, ND, bowel sounds + Extremities: no edema, no cyanosis    The results of significant diagnostics from this hospitalization (including imaging, microbiology, ancillary and laboratory) are listed below for reference.     Microbiology: Recent Results (from the past 240 hour(s))  Resp Panel by RT-PCR (Flu A&B, Covid) Nasopharyngeal Swab     Status: None   Collection Time: 08/17/21  1:17 PM   Specimen: Nasopharyngeal Swab; Nasopharyngeal(NP) swabs in vial transport medium  Result Value Ref Range Status   SARS Coronavirus 2 by RT PCR NEGATIVE NEGATIVE Final    Comment: (NOTE) SARS-CoV-2 target nucleic acids are NOT  DETECTED.  The SARS-CoV-2 RNA is generally detectable in upper respiratory specimens during the acute phase of infection. The lowest concentration of SARS-CoV-2 viral copies this assay can detect is 138 copies/mL. A negative result does not preclude SARS-Cov-2 infection and should not be used as the sole basis for treatment or other patient management decisions. A negative result may occur with  improper specimen collection/handling, submission of specimen other than nasopharyngeal swab, presence of viral mutation(s) within the areas targeted by this assay, and inadequate number of viral copies(<138 copies/mL). A negative result must be combined with clinical observations, patient history, and epidemiological information. The expected result is Negative.  Fact Sheet for Patients:  EntrepreneurPulse.com.au  Fact Sheet for Healthcare Providers:  IncredibleEmployment.be  This test is no t yet approved or cleared by  the [place] FDA and  has been authorized for detection and/or diagnosis of SARS-CoV-2 by FDA under an Emergency Use Authorization (EUA). This EUA will remain  in effect (meaning this test can be used) for the duration of the COVID-19 declaration under Section 564(b)(1) of the Act, 21 U.S.C.section 360bbb-3(b)(1), unless the authorization is terminated  or revoked sooner.       Influenza A by PCR NEGATIVE NEGATIVE Final   Influenza B by PCR NEGATIVE NEGATIVE Final    Comment: (NOTE) The Xpert Xpress SARS-CoV-2/FLU/RSV plus assay is intended as an aid in the diagnosis of influenza from Nasopharyngeal swab specimens and should not be used as a sole basis for treatment. Nasal washings and aspirates are unacceptable for Xpert Xpress SARS-CoV-2/FLU/RSV testing.  Fact Sheet for Patients: EntrepreneurPulse.com.au  Fact Sheet for Healthcare Providers: IncredibleEmployment.be  This test is not yet approved or cleared by the [place] FDA and has been authorized for detection and/or diagnosis of SARS-CoV-2 by FDA under an Emergency Use Authorization (EUA). This EUA will remain in effect (meaning this test can be used) for the duration of the COVID-19 declaration under Section 564(b)(1) of the Act, 21 U.S.C. section 360bbb-3(b)(1), unless the authorization is terminated or revoked.  Performed at [facility], [address] [place], [place] [address]   Urine Culture     Status: Abnormal   Collection Time: 08/18/21 12:40 PM   Specimen: Urine, Clean Catch  Result Value Ref Range Status   Specimen Description   Final    URINE, CLEAN CATCH Performed at [facility], [address], [place], [place] [address]    Special Requests   Final    NONE Performed at [facility], [address] [place], [place] [address]    Culture MULTIPLE SPECIES PRESENT, SUGGEST RECOLLECTION (A)  Final   Report Status 08/19/2021 FINAL  Final     Labs: BNP (last  3 results) No results for input(s): BNP in the last 8760 hours. Basic Metabolic Panel: Recent Labs  Lab 08/17/21 0932 08/18/21 0459 08/19/21 0416 08/20/21 0340  NA 135 134* 135 134*  K 3.1* 3.9 3.8 3.5  CL 98 101 100 101  CO2 28 24 27 26   GLUCOSE 152* 112* 116* 104*  BUN 23 18 17 18   CREATININE 0.68 0.56 0.52 0.56  CALCIUM 9.0 8.6* 8.7* 8.5*  MG 2.1  --   --   --    Liver Function Tests: Recent Labs  Lab 08/17/21 0932 08/19/21 0416 08/20/21 0340  AST 19 26 24   ALT 15 24 27   ALKPHOS 58 55 51  BILITOT 0.9 0.8 0.4  PROT 7.3 6.6 6.1*  ALBUMIN 3.4* 2.7* 2.4*   No results  for input(s): LIPASE, AMYLASE in the last 168 hours. Recent Labs  Lab 08/20/21 0340  AMMONIA 31   CBC: Recent Labs  Lab 08/17/21 0932 08/18/21 0459 08/19/21 0416 08/20/21 0340 08/21/21 0423 08/22/21 0331  WBC 6.3 6.3 7.1 5.6 4.7 4.8  NEUTROABS 5.0  --   --   --   --   --   HGB 12.4 11.6* 11.5* 10.9* 10.9* 10.5*  HCT 38.4 37.6 35.8* 32.5* 34.2* 32.9*  MCV 85.1 89.3 85.0 84.4 87.2 87.0  PLT 239 208 245 252 257 274   Cardiac Enzymes: No results for input(s): CKTOTAL, CKMB, CKMBINDEX, TROPONINI in the last 168 hours. BNP: Invalid input(s): POCBNP CBG: No results for input(s): GLUCAP in the last 168 hours. D-Dimer No results for input(s): DDIMER in the last 72 hours. Hgb A1c No results for input(s): HGBA1C in the last 72 hours. Lipid Profile No results for input(s): CHOL, HDL, LDLCALC, TRIG, CHOLHDL, LDLDIRECT in the last 72 hours. Thyroid function studies No results for input(s): TSH, T4TOTAL, T3FREE, THYROIDAB in the last 72 hours.  Invalid input(s): FREET3 Anemia work up No results for input(s): VITAMINB12, FOLATE, FERRITIN, TIBC, IRON, RETICCTPCT in the last 72 hours. Urinalysis    Component Value Date/Time   COLORURINE AMBER (A) 08/18/2021 1240   APPEARANCEUR CLOUDY (A) 08/18/2021 1240   LABSPEC >1.046 (H) 08/18/2021 1240   PHURINE 5.0 08/18/2021 1240   GLUCOSEU 50 (A)  08/18/2021 1240   HGBUR NEGATIVE 08/18/2021 1240   BILIRUBINUR NEGATIVE 08/18/2021 1240   KETONESUR 20 (A) 08/18/2021 1240   PROTEINUR 30 (A) 08/18/2021 1240   UROBILINOGEN 2.0 (H) 06/05/2013 1002   NITRITE NEGATIVE 08/18/2021 1240   LEUKOCYTESUR TRACE (A) 08/18/2021 1240   Sepsis Labs Invalid input(s): PROCALCITONIN,  WBC,  LACTICIDVEN Microbiology Recent Results (from the past 240 hour(s))  Resp Panel by RT-PCR (Flu A&B, Covid) Nasopharyngeal Swab     Status: None   Collection Time: 08/17/21  1:17 PM   Specimen: Nasopharyngeal Swab; Nasopharyngeal(NP) swabs in vial transport medium  Result Value Ref Range Status   SARS Coronavirus 2 by RT PCR NEGATIVE NEGATIVE Final    Comment: (NOTE) SARS-CoV-2 target nucleic acids are NOT DETECTED.  The SARS-CoV-2 RNA is generally detectable in upper respiratory specimens during the acute phase of infection. The lowest concentration of SARS-CoV-2 viral copies this assay can detect is 138 copies/mL. A negative result does not preclude SARS-Cov-2 infection and should not be used as the sole basis for treatment or other patient management decisions. A negative result may occur with  improper specimen collection/handling, submission of specimen other than nasopharyngeal swab, presence of viral mutation(s) within the areas targeted by this assay, and inadequate number of viral copies(<138 copies/mL). A negative result must be combined with clinical observations, patient history, and epidemiological information. The expected result is Negative.  Fact Sheet for Patients:  EntrepreneurPulse.com.au  Fact Sheet for Healthcare Providers:  IncredibleEmployment.be  This test is no t yet approved or cleared by the [place] FDA and  has been authorized for detection and/or diagnosis of SARS-CoV-2 by FDA under an Emergency Use Authorization (EUA). This EUA will remain  in effect (meaning this test can be used)  for the duration of the COVID-19 declaration under Section 564(b)(1) of the Act, 21 U.S.C.section 360bbb-3(b)(1), unless the authorization is terminated  or revoked sooner.       Influenza A by PCR NEGATIVE NEGATIVE Final   Influenza B by PCR NEGATIVE NEGATIVE Final    Comment: (NOTE)  The Xpert Xpress SARS-CoV-2/FLU/RSV plus assay is intended as an aid in the diagnosis of influenza from Nasopharyngeal swab specimens and should not be used as a sole basis for treatment. Nasal washings and aspirates are unacceptable for Xpert Xpress SARS-CoV-2/FLU/RSV testing.  Fact Sheet for Patients: EntrepreneurPulse.com.au  Fact Sheet for Healthcare Providers: IncredibleEmployment.be  This test is not yet approved or cleared by the [place] FDA and has been authorized for detection and/or diagnosis of SARS-CoV-2 by FDA under an Emergency Use Authorization (EUA). This EUA will remain in effect (meaning this test can be used) for the duration of the COVID-19 declaration under Section 564(b)(1) of the Act, 21 U.S.C. section 360bbb-3(b)(1), unless the authorization is terminated or revoked.  Performed at [facility], [address] [place], [place] [address]   Urine Culture     Status: Abnormal   Collection Time: 08/18/21 12:40 PM   Specimen: Urine, Clean Catch  Result Value Ref Range Status   Specimen Description   Final    URINE, CLEAN CATCH Performed at [facility], [address], [place], [place] [address]    Special Requests   Final    NONE Performed at [facility], [address] [place], [place] [address]    Culture MULTIPLE SPECIES PRESENT, SUGGEST RECOLLECTION (A)  Final   Report Status 08/19/2021 FINAL  Final     Time coordinating discharge: 81mins  SIGNED:   Kathie Dike, MD  [facility] 08/22/2021, 10:59 AM   If 7PM-7AM, please contact night-coverage [email or web address]

## 2021-08-22 NOTE — Progress Notes (Addendum)
Physical Therapy Treatment Patient Details Name: [name] MRN: [id number] DOB: 1940-11-27 Today's Date: 08/22/2021   History of Present Illness [name] is a 80 y.o. female with medical history significant of hypertension, hyperlipidemia, was complaining of left lower extremity pain and swelling for the past week.  She reports that yesterday her symptoms got substantially worse to the point that she was unable to walk or bear weight on her leg.  Denies any preceding trauma.  She has not been sedentary/immobile prior to onset of symptoms.  She has not been started on any new medications.  Reports that her appetite has been good.  She has noticed weight loss of approximately 14 pounds in the past year.  She denies any chest pain or shortness of breath.    PT Comments    Patient presents seated in chair and slightly apprehensive for therapy secondary to having walked to bathroom with nursing staff assisting.  Patient demonstrates increased BLE for completing sit to stands and able to take few short slow labored steps at bedside with Mod/Max assist before requesting to sit due to fatigue and generalized weakness.  Patient tolerated staying up in chair after therapy.  Patient will benefit from continued physical therapy in hospital and recommended venue below to increase strength, balance, endurance for safe ADLs and gait.    Recommendations for follow up therapy are one component of a multi-disciplinary discharge planning process, led by the attending physician.  Recommendations may be updated based on patient status, additional functional criteria and insurance authorization.  Follow Up Recommendations  Skilled nursing-short term rehab (<3 hours/day)     Assistance Recommended at Discharge Frequent or constant Supervision/Assistance  Equipment Recommendations  None recommended by PT    Recommendations for Other Services       Precautions / Restrictions Precautions Precautions:  Fall Restrictions Weight Bearing Restrictions: No     Mobility  Bed Mobility               General bed mobility comments: Patient presents seated in chair (assisted by nursing staff)    Transfers Overall transfer level: Needs assistance Equipment used: Rolling walker (2 wheels) Transfers: Sit to/from Stand Sit to Stand: Mod assist           General transfer comment: demonstrates increased BLE strength for completing sit to stands with less c/o of BLE pain    Ambulation/Gait Ambulation/Gait assistance: Mod assist;Max assist Gait Distance (Feet): 3 Feet Assistive device: Rolling walker (2 wheels) Gait Pattern/deviations: Decreased step length - left;Decreased step length - right;Decreased stride length Gait velocity: slow   General Gait Details: limited to 3-4 slow labored short steps at bedside due to c/o fatigue and generalized weakness   Stairs             Wheelchair Mobility    Modified Rankin (Stroke Patients Only)       Balance Overall balance assessment: Needs assistance Sitting-balance support: Feet supported Sitting balance-Leahy Scale: Fair Sitting balance - Comments: fair/good seated in chair   Standing balance support: During functional activity;Reliant on assistive device for balance;Bilateral upper extremity supported Standing balance-Leahy Scale: Poor Standing balance comment: using RW                            Cognition Arousal/Alertness: Awake/alert Behavior During Therapy: Anxious;WFL for tasks assessed/performed Overall Cognitive Status: No family/caregiver present to determine baseline cognitive functioning  General Comments: Patient able to follow instructions consistantly        Exercises General Exercises - Lower Extremity Long Arc Quad: Seated;AROM;Strengthening;Both;10 reps Hip Flexion/Marching: Seated;AROM;Strengthening;Both;10 reps Toe Raises:  Seated;AROM;Strengthening;Both;10 reps Heel Raises: Seated;AROM;Strengthening;Both;10 reps    General Comments        Pertinent Vitals/Pain Pain Assessment: Faces Faces Pain Scale: Hurts little more Pain Location: BLE with movement pressure Pain Descriptors / Indicators: Grimacing;Guarding Pain Intervention(s): Limited activity within patient's tolerance;Monitored during session;Repositioned    Home Living                          Prior Function            PT Goals (current goals can now be found in the care plan section) Acute Rehab PT Goals Patient Stated Goal: return home PT Goal Formulation: With patient Time For Goal Achievement: 09/01/21 Potential to Achieve Goals: Good Progress towards PT goals: Progressing toward goals    Frequency    Min 3X/week      PT Plan Current plan remains appropriate    Co-evaluation              AM-PAC PT "6 Clicks" Mobility   Outcome Measure  Help needed turning from your back to your side while in a flat bed without using bedrails?: A Lot Help needed moving from lying on your back to sitting on the side of a flat bed without using bedrails?: A Lot Help needed moving to and from a bed to a chair (including a wheelchair)?: A Lot Help needed standing up from a chair using your arms (e.g., wheelchair or bedside chair)?: A Lot Help needed to walk in hospital room?: A Lot Help needed climbing 3-5 steps with a railing? : Total 6 Click Score: 11    End of Session   Activity Tolerance: Patient tolerated treatment well;Patient limited by fatigue;Patient limited by pain Patient left: in chair;with call bell/phone within reach;with chair alarm set Nurse Communication: Mobility status PT Visit Diagnosis: Unsteadiness on feet (R26.81);Other abnormalities of gait and mobility (R26.89);Muscle weakness (generalized) (M62.81)     Time: 4888-9169 PT Time Calculation (min) (ACUTE ONLY): 21 min  Charges:  $Therapeutic  Exercise: 8-22 mins $Therapeutic Activity: 8-22 mins                     10:47 AM, 08/22/21 Lonell Grandchild, MPT Physical Therapist with [facility] [phone] office [phone] mobile phone

## 2021-08-22 NOTE — Care Management Important Message (Signed)
Important Message  Patient Details  Name: [name] MRN: [id number] Date of Birth: July 28, 1941   Medicare Important Message Given:  Yes     Tommy Medal 08/22/2021, 1:59 PM

## 2021-08-22 NOTE — Progress Notes (Signed)
Patient complaining of aching, dull pain to right lower leg. Patient stated her pain is a ten out of ten. No redness noted. Edema in BLE's. Patient requesting Tylenol. MD notified.

## 2021-08-22 NOTE — Discharge Instructions (Addendum)
Information on my medicine - ELIQUIS (apixaban)  This medication education was reviewed with me or my healthcare representative as part of my discharge preparation.    Why was Eliquis prescribed for you? Eliquis was prescribed to treat blood clots that may have been found in the veins of your legs (deep vein thrombosis) or in your lungs (pulmonary embolism) and to reduce the risk of them occurring again.  What do You need to know about Eliquis ? The starting dose is 10 mg (two 5 mg tablets) taken TWICE daily for the FIRST SEVEN (7) DAYS, then on (enter date)  08/28/2021 PM dose the dose is reduced to ONE 5 mg tablet taken TWICE daily.  Eliquis may be taken with or without food.   Try to take the dose about the same time in the morning and in the evening. If you have difficulty swallowing the tablet whole please discuss with your pharmacist how to take the medication safely.  Take Eliquis exactly as prescribed and DO NOT stop taking Eliquis without talking to the doctor who prescribed the medication.  Stopping may increase your risk of developing a new blood clot.  Refill your prescription before you run out.  After discharge, you should have regular check-up appointments with your healthcare provider that is prescribing your Eliquis.    What do you do if you miss a dose? If a dose of ELIQUIS is not taken at the scheduled time, take it as soon as possible on the same day and twice-daily administration should be resumed. The dose should not be doubled to make up for a missed dose.  Important Safety Information A possible side effect of Eliquis is bleeding. You should call your healthcare provider right away if you experience any of the following: Bleeding from an injury or your nose that does not stop. Unusual colored urine (red or dark brown) or unusual colored stools (red or black). Unusual bruising for unknown reasons. A serious fall or if you hit your head (even if there is no  bleeding).  Some medicines may interact with Eliquis and might increase your risk of bleeding or clotting while on Eliquis. To help avoid this, consult your healthcare provider or pharmacist prior to using any new prescription or non-prescription medications, including herbals, vitamins, non-steroidal anti-inflammatory drugs (NSAIDs) and supplements.  This website has more information on Eliquis (apixaban): http://www.eliquis.com/eliquis/home

## 2021-08-23 NOTE — Progress Notes (Signed)
Patient was discharged to skilled nursing facility yesterday.  It appears that she did not discharge due to transportation/logistic issues.  She was briefly seen and examined today.  Vitals from overnight reviewed.  She was sitting up in bed when I saw her earlier today and did not have any new complaints.  She felt that she was doing well.  Agreeable to go to skilled nursing facility today.  Overall plan is unchanged from yesterday.  No change in discharge medications.  Please refer to discharge summary done yesterday for further details.  She is stable for discharge to skilled nursing facility today.  [employer]

## 2021-08-23 NOTE — Progress Notes (Signed)
Nsg Discharge Note  Admit Date:  08/17/2021 Discharge date: 08/23/2021   Marcha Solders to be D/C'd Home per MD order.  AVS completed.  Copy for chart, and copy for patient signed, and dated. Patient/caregiver able to verbalize understanding.  Discharge Medication: Allergies as of 08/23/2021   No Known Allergies      Medication List     STOP taking these medications    hydrochlorothiazide 25 MG tablet Commonly known as: HYDRODIURIL   lisinopril 5 MG tablet Commonly known as: ZESTRIL       TAKE these medications    acetaminophen 325 MG tablet Commonly known as: TYLENOL Take 650 mg by mouth every 6 (six) hours as needed for mild pain or moderate pain.   apixaban 5 MG Tabs tablet Commonly known as: ELIQUIS Take 10mg  po bid through 11/6 then take 5mg  po bid   cetirizine 10 MG tablet Commonly known as: ZYRTEC Take 10 mg by mouth daily.   diphenhydramine-acetaminophen 25-500 MG Tabs tablet Commonly known as: TYLENOL PM Take 1 tablet by mouth at bedtime as needed (for sleep).   lovastatin 20 MG tablet Commonly known as: MEVACOR Take 20 mg by mouth at bedtime.   metoprolol tartrate 25 MG tablet Commonly known as: LOPRESSOR Take 1 tablet (25 mg total) by mouth 2 (two) times daily.   ondansetron 4 MG tablet Commonly known as: ZOFRAN Take 1 tablet (4 mg total) by mouth every 6 (six) hours.   QUEtiapine 25 MG tablet Commonly known as: SEROQUEL Take 1 tablet (25 mg total) by mouth at bedtime.   Vitamin D (Ergocalciferol) 1.25 MG (50000 UNIT) Caps capsule Commonly known as: DRISDOL Take 50,000 Units by mouth once a week.        Discharge Assessment: Vitals:   08/23/21 0558 08/23/21 0844  BP: 114/65 117/64  Pulse: 93 (!) 105  Resp: 16   Temp: 98.2 F (36.8 C)   SpO2: 100%    Skin clean, dry and intact without evidence of skin break down, no evidence of skin tears noted. IV catheter discontinued intact. Site without signs and symptoms of complications -  no redness or edema noted at insertion site, patient denies c/o pain - only slight tenderness at site.  Dressing with slight pressure applied.  D/c Instructions-Education: Discharge instructions given to patient/family with verbalized understanding. D/c education completed with patient/family including follow up instructions, medication list, d/c activities limitations if indicated, with other d/c instructions as indicated by MD - patient able to verbalize understanding, all questions fully answered. Patient instructed to return to ED, call 911, or call MD for any changes in condition.  Patient escorted via [place], and D/C home via private auto.  Dorcas Mcmurray, RN 08/23/2021 12:27 PM

## 2021-08-23 NOTE — Progress Notes (Signed)
Patient discharged to [facility] facility. Report called and given to [facility] LPN.  Transported by [facility] to awaiting facility.

## 2021-08-23 NOTE — TOC Transition Note (Signed)
Transition of Care [facility]) - CM/SW Discharge Note   Patient Details  Name: [name] MRN: [id number] Date of Birth: 27-Jan-1941  Transition of Care [facility]) CM/SW Contact:  Ihor Gully, LCSW Phone Number: 08/23/2021, 8:08 AM   Clinical Narrative:    Discharge clinicals sent to facility on 10/31. [facility] to provide transport. TOC signing off.    Final next level of care: Skilled Nursing Facility Barriers to Discharge: No Barriers Identified   Patient Goals and CMS Choice Patient states their goals for this hospitalization and ongoing recovery are:: rehab then home      Discharge Placement              Patient chooses bed at: Other - please specify in the comment section below: ([facility]) Patient to be transferred to facility by: [place] Name of family member notified: Jonelle Sidle and Izell [place] (daughter and son) Patient and family notified of of transfer: 08/23/21  Discharge Plan and Services     Post Acute Care Choice: [place]                               Social Determinants of Health (SDOH) Interventions     Readmission Risk Interventions No flowsheet data found.

## 2021-08-25 DIAGNOSIS — E785 Hyperlipidemia, unspecified: Secondary | ICD-10-CM | POA: Diagnosis not present

## 2021-08-25 DIAGNOSIS — I82402 Acute embolism and thrombosis of unspecified deep veins of left lower extremity: Secondary | ICD-10-CM | POA: Diagnosis not present

## 2021-08-25 DIAGNOSIS — R52 Pain, unspecified: Secondary | ICD-10-CM | POA: Diagnosis not present

## 2021-08-25 DIAGNOSIS — I10 Essential (primary) hypertension: Secondary | ICD-10-CM | POA: Diagnosis not present

## 2021-08-29 DIAGNOSIS — E876 Hypokalemia: Secondary | ICD-10-CM | POA: Diagnosis not present

## 2021-09-01 DIAGNOSIS — Z79899 Other long term (current) drug therapy: Secondary | ICD-10-CM | POA: Diagnosis not present

## 2021-09-01 DIAGNOSIS — I82402 Acute embolism and thrombosis of unspecified deep veins of left lower extremity: Secondary | ICD-10-CM | POA: Diagnosis not present

## 2021-09-01 DIAGNOSIS — I10 Essential (primary) hypertension: Secondary | ICD-10-CM | POA: Diagnosis not present

## 2021-09-01 DIAGNOSIS — R52 Pain, unspecified: Secondary | ICD-10-CM | POA: Diagnosis not present

## 2021-09-04 ENCOUNTER — Emergency Department (HOSPITAL_COMMUNITY): Payer: Medicare Other

## 2021-09-04 ENCOUNTER — Other Ambulatory Visit: Payer: Self-pay

## 2021-09-04 ENCOUNTER — Encounter (HOSPITAL_COMMUNITY): Payer: Self-pay | Admitting: Emergency Medicine

## 2021-09-04 ENCOUNTER — Emergency Department (HOSPITAL_COMMUNITY)
Admission: EM | Admit: 2021-09-04 | Discharge: 2021-09-04 | Disposition: A | Discharge status: Home / Self Care | Payer: Medicare Other | Attending: Emergency Medicine | Admitting: Emergency Medicine

## 2021-09-04 DIAGNOSIS — M25562 Pain in left knee: Secondary | ICD-10-CM | POA: Diagnosis not present

## 2021-09-04 DIAGNOSIS — R000 Tachycardia, unspecified: Secondary | ICD-10-CM | POA: Diagnosis not present

## 2021-09-04 DIAGNOSIS — Z7901 Long term (current) use of anticoagulants: Secondary | ICD-10-CM | POA: Insufficient documentation

## 2021-09-04 DIAGNOSIS — R609 Edema, unspecified: Secondary | ICD-10-CM | POA: Diagnosis not present

## 2021-09-04 DIAGNOSIS — R456 Violent behavior: Secondary | ICD-10-CM | POA: Diagnosis not present

## 2021-09-04 DIAGNOSIS — R410 Disorientation, unspecified: Secondary | ICD-10-CM | POA: Diagnosis not present

## 2021-09-04 DIAGNOSIS — I10 Essential (primary) hypertension: Secondary | ICD-10-CM | POA: Insufficient documentation

## 2021-09-04 DIAGNOSIS — Z79899 Other long term (current) drug therapy: Secondary | ICD-10-CM | POA: Diagnosis not present

## 2021-09-04 DIAGNOSIS — M1712 Unilateral primary osteoarthritis, left knee: Secondary | ICD-10-CM | POA: Diagnosis not present

## 2021-09-04 MED ORDER — OXYCODONE-ACETAMINOPHEN 5-325 MG PO TABS
1.0000 | ORAL_TABLET | Freq: Once | ORAL | Status: AC
  Administered 2021-09-04: 1 via ORAL
  Filled 2021-09-04: qty 1

## 2021-09-04 NOTE — ED Provider Notes (Signed)
[facility] EMERGENCY DEPARTMENT Provider Note   CSN: [id number] Arrival date & time: 09/04/21  1013     History Chief Complaint  Patient presents with   Joint Swelling    Left Knee    NADINA [name] is a 80 y.o. female.  Patient presents chief complaint of left knee pain.  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  Ultimately discharged to nursing home.  No reports of fall or other trauma to the left knee, however complaining of increased pain and swelling of the left knee.      Past Medical History:  Diagnosis Date   Arthritis    Depression    HTN (hypertension)    Hypercholesteremia    S/P colonoscopy 2009   adenoma, repeat 5 years    Patient Active Problem List   Diagnosis Date Noted   DVT (deep venous thrombosis) ([place]) 08/17/2021   Hypokalemia 08/17/2021   HTN (hypertension) 08/17/2021   Hyperlipidemia 08/17/2021   Adenomatous colon polyp 03/18/2013    Past Surgical History:  Procedure Laterality Date   CATARACT EXTRACTION W/PHACO Right 04/14/2014   Procedure: CATARACT EXTRACTION PHACO AND INTRAOCULAR LENS PLACEMENT ([place]) CDE=6.22;  Surgeon: Elta Guadeloupe T. Gershon Crane, MD;  Location: [facility];  Service: Ophthalmology;  Laterality: Right;   CATARACT EXTRACTION W/PHACO Left 06/16/2014   Procedure: CATARACT EXTRACTION PHACO AND INTRAOCULAR LENS PLACEMENT ([facility]);  Surgeon: Elta Guadeloupe T. Gershon Crane, MD;  Location: [facility];  Service: Ophthalmology;  Laterality: Left;  CDE:8.01   COLONOSCOPY  02/17/2005   JQZ:ESPQZR polyp and right colon polyp, status post snare polypectomy/Otherwise normal rectum and colon   COLONOSCOPY  02/18/2008   RMR: Normal rectum/Polyp in the hepatic flexure removed with snare cecal polyps were cold biopsied/removed; remainder of the colonic mucosa appeared normal. Tubluar adenoma   COLONOSCOPY N/A 03/26/2013   Procedure: COLONOSCOPY;  Surgeon: Daneil Dolin, MD;  Location: [facility];  Service: Endoscopy;  Laterality: N/A;  1:15   COLONOSCOPY N/A 06/05/2018   Procedure: COLONOSCOPY;  Surgeon: Daneil Dolin, MD;  Location: [facility];  Service: Endoscopy;  Laterality: N/A;  10:30   TOTAL ABDOMINAL HYSTERECTOMY     fibroid tumors   UTERINE FIBROID SURGERY       OB History     Gravida  3   Para  3   Term  3   Preterm      AB      Living  3      SAB      IAB      Ectopic      Multiple      Live Births              Family History  Problem Relation Age of Onset   Stroke Brother    Stroke Mother        deceased   Emphysema Father        deceased   Colon cancer Neg Hx     Social History   Tobacco Use   Smoking status: Never   Smokeless tobacco: Never  Vaping Use   Vaping Use: Never used  Substance Use Topics   Alcohol use: No   Drug use: No    Home Medications Prior to Admission medications   Medication Sig Start Date End Date Taking? Authorizing Provider  acetaminophen (TYLENOL) 325 MG tablet Take 650 mg by mouth every  6 (six) hours as needed for mild pain or moderate pain.    [provider]  apixaban (ELIQUIS) 5 MG TABS tablet Take 10mg  po bid through 11/6 then take 5mg  po bid 08/22/21   Kathie Dike, MD  cetirizine (ZYRTEC) 10 MG tablet Take 10 mg by mouth daily.    [provider]  diphenhydramine-acetaminophen (TYLENOL PM) 25-500 MG TABS tablet Take 1 tablet by mouth at bedtime as needed (for sleep).    [provider]  lovastatin (MEVACOR) 20 MG tablet Take 20 mg by mouth at bedtime.      [provider]  metoprolol tartrate (LOPRESSOR) 25 MG tablet Take 1 tablet (25 mg total) by mouth 2 (two) times daily. 08/22/21   Kathie Dike, MD  ondansetron (ZOFRAN) 4 MG tablet Take 1 tablet (4 mg total) by mouth every 6 (six) hours. Patient not taking: No sig reported 10/26/18   Nat Christen, MD  QUEtiapine (SEROQUEL) 25 MG tablet Take 1 tablet (25 mg total)  by mouth at bedtime. 08/22/21   Kathie Dike, MD  Vitamin D, Ergocalciferol, (DRISDOL) 1.25 MG (50000 UNIT) CAPS capsule Take 50,000 Units by mouth once a week. 07/01/21   [provider]    Allergies    Patient has no known allergies.  Review of Systems   Review of Systems  Constitutional:  Negative for fever.  HENT:  Negative for ear pain.   Eyes:  Negative for pain.  Respiratory:  Negative for cough.   Cardiovascular:  Negative for chest pain.  Gastrointestinal:  Negative for abdominal pain.  Genitourinary:  Negative for flank pain.  Musculoskeletal:  Negative for back pain.  Skin:  Negative for rash.  Neurological:  Negative for headaches.   Physical Exam Updated Vital Signs BP 130/70   Pulse (!) 116   Temp 98.9 F (37.2 C) (Oral)   Resp 16   Ht 5\' 9"  (1.753 m)   Wt 84 kg   SpO2 100%   BMI 27.35 kg/m   Physical Exam Constitutional:      General: She is not in acute distress.    Appearance: Normal appearance.  HENT:     Head: Normocephalic.     Nose: Nose normal.  Eyes:     Extraocular Movements: Extraocular movements intact.  Cardiovascular:     Rate and Rhythm: Tachycardia present.  Pulmonary:     Effort: Pulmonary effort is normal.  Musculoskeletal:     Cervical back: Normal range of motion.     Comments: Moderately decreased range of motion of the left knee.  However intact passive range of motion to about 30 degrees of flexion before becomes too painful.  No evidence of compartment syndrome compartments are otherwise soft.  No abnormal warmth no open lesion.  No significant swelling noted compared to the other knee.  Neurological:     General: No focal deficit present.     Mental Status: She is alert. Mental status is at baseline.    ED Results / Procedures / Treatments   Labs (all labs ordered are listed, but only abnormal results are displayed) Labs Reviewed - No data to display  EKG None  Radiology DG Knee Complete 4 Views  Left  Result Date: 09/04/2021 CLINICAL DATA:  Pain left knee EXAM: LEFT KNEE - COMPLETE 4+ VIEW COMPARISON:  08/17/2021 FINDINGS: No recent fracture is seen. There is no significant effusion. Osteopenia is seen in bony structures. Degenerative changes are noted with bony spurs in medial, lateral and patellofemoral  compartments, more severe in the patellofemoral compartment. In the lateral view, there is mild indentation in the anterior cortical margin of distal shaft of femur without break in the cortical margins. This finding may be a congenital variation or residual change from previous injury. Small smooth marginated calcifications along the lateral margin of patella may be residual from previous injury. IMPRESSION: No recent fracture or dislocation is seen. Significant degenerative changes are noted, more severe in the patellofemoral compartment. There is no significant effusion. Electronically Signed   By: Elmer Picker M.D.   On: 09/04/2021 10:59    Procedures Procedures   Medications Ordered in ED Medications  oxyCODONE-acetaminophen (PERCOCET/ROXICET) 5-325 MG per tablet 1 tablet (1 tablet Oral Given 09/04/21 1039)    ED Course  I have reviewed the triage vital signs and the nursing notes.  Pertinent labs & imaging results that were available during my care of the patient were reviewed by me and considered in my medical decision making (see chart for details).    MDM Rules/Calculators/A&P                           X-ray of the left knee show significant degenerative joint disease, only mild evidence of effusion noted.  Patient is otherwise neurovascular intact bilateral upper and lower extremities distal dorsalis pedis pulses are intact and extremity perfusion appears normal.  Patient placed in a knee immobilizer, discharged back to nursing home.  Advised return for fevers worsening pain or any additional concerns.  Final Clinical Impression(s) / ED Diagnoses Final  diagnoses:  Acute pain of left knee    Rx / DC Orders ED Discharge Orders     None        Luna Fuse, MD 09/04/21 1155

## 2021-09-04 NOTE — ED Notes (Signed)
[facility] [employer] called to set up transportation at this time. 

## 2021-09-04 NOTE — Discharge Instructions (Signed)
Call your primary care doctor or specialist as discussed in the next 2-3 days.   Return immediately back to the ER if:  Your symptoms worsen within the next 12-24 hours. You develop new symptoms such as new fevers, persistent vomiting, new pain, shortness of breath, or new weakness or numbness, or if you have any other concerns.  

## 2021-09-04 NOTE — ED Triage Notes (Signed)
[facility] sent pt by [facility] for left knee swelling w/ hx of DVT

## 2021-09-05 NOTE — Progress Notes (Signed)
[name],  [id number]   CLINIC:  Medical Oncology/Hematology  CONSULT NOTE  Patient Care Team: Joyice Faster, FNP as PCP - General (Family Medicine) Gala Romney, Cristopher Estimable, MD (Gastroenterology)  CHIEF COMPLAINTS/PURPOSE OF CONSULTATION:  Evaluation of sternal sclerotic lesions and acute DVT left lower extremity  HISTORY OF PRESENTING ILLNESS:  [name] 80 y.o. female is here because of evaluation of sternal sclerotic lesions and acute DVT left lower extremity, at the request of Dr. Roderic Palau.  Today she reports feeling well, and she is accompanied by a caregiver from [facility] where she currently resides. She denies any current pain. She denies any current bleeding. She spends most of her day sitting down, and she reports she will occasional use her wheelchair to move. Prior to retirement, she worked on a tobacco farm. Her maternal aunt had cancer, but she cannot recall what kind. She reports smoking 2 years before quitting 2 years ago.   MEDICAL HISTORY:  Past Medical History:  Diagnosis Date   Arthritis    Depression    HTN (hypertension)    Hypercholesteremia    S/P colonoscopy 2009   adenoma, repeat 5 years    SURGICAL HISTORY: Past Surgical History:  Procedure Laterality Date   CATARACT EXTRACTION W/PHACO Right 04/14/2014   Procedure: CATARACT EXTRACTION PHACO AND INTRAOCULAR LENS PLACEMENT ([place]) CDE=6.22;  Surgeon: Elta Guadeloupe T. Gershon Crane, MD;  Location: [facility];  Service: Ophthalmology;  Laterality: Right;   CATARACT EXTRACTION W/PHACO Left 06/16/2014   Procedure: CATARACT EXTRACTION PHACO AND INTRAOCULAR LENS PLACEMENT (IOC);  Surgeon: Elta Guadeloupe T. Gershon Crane, MD;  Location: [facility];  Service: Ophthalmology;  Laterality: Left;  CDE:8.01   COLONOSCOPY  02/17/2005   [id number] polyp and right colon polyp, status post snare polypectomy/Otherwise normal rectum and colon   COLONOSCOPY  02/18/2008   RMR: Normal rectum/Polyp in the  hepatic flexure removed with snare cecal polyps were cold biopsied/removed; remainder of the colonic mucosa appeared normal. Tubluar adenoma   COLONOSCOPY N/A 03/26/2013   Procedure: COLONOSCOPY;  Surgeon: Daneil Dolin, MD;  Location: [facility];  Service: Endoscopy;  Laterality: N/A;  1:15   COLONOSCOPY N/A 06/05/2018   Procedure: COLONOSCOPY;  Surgeon: Daneil Dolin, MD;  Location: [facility];  Service: Endoscopy;  Laterality: N/A;  10:30   TOTAL ABDOMINAL HYSTERECTOMY     fibroid tumors   UTERINE FIBROID SURGERY      SOCIAL HISTORY: Social History   Socioeconomic History   Marital status: Divorced    Spouse name: Not on file   Number of children: Not on file   Years of education: Not on file   Highest education level: Not on file  Occupational History   Occupation: CNA    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, learning disability, part-time  Tobacco Use   Smoking status: Never   Smokeless tobacco: Never  Vaping Use   Vaping Use: Never used  Substance and Sexual Activity   Alcohol use: No   Drug use: No   Sexual activity: Never    Birth control/protection: None  Other Topics Concern   Not on file  Social History Narrative   Not on file   Social Determinants of Health   Financial Resource Strain: Not on file  Food Insecurity: Not on file  Transportation Needs: Not on file  Physical Activity: Not on file  Stress: Not on file  Social Connections: Not on file  Intimate Partner Violence: Not  on file    FAMILY HISTORY: Family History  Problem Relation Age of Onset   Stroke Brother    Stroke Mother        deceased   Emphysema Father        deceased   Colon cancer Neg Hx     ALLERGIES:  has No Known Allergies.  MEDICATIONS:  Current Outpatient Medications  Medication Sig Dispense Refill   apixaban (ELIQUIS) 5 MG TABS tablet Take 10mg  po bid through 11/6 then take 5mg  po bid 60 tablet    cetirizine (ZYRTEC) 10 MG tablet Take 10 mg by mouth daily.     lovastatin  (MEVACOR) 20 MG tablet Take 20 mg by mouth at bedtime.       metoprolol tartrate (LOPRESSOR) 25 MG tablet Take 1 tablet (25 mg total) by mouth 2 (two) times daily.     ondansetron (ZOFRAN) 4 MG tablet Take 1 tablet (4 mg total) by mouth every 6 (six) hours. 8 tablet 0   QUEtiapine (SEROQUEL) 25 MG tablet Take 1 tablet (25 mg total) by mouth at bedtime.     Vitamin D, Ergocalciferol, (DRISDOL) 1.25 MG (50000 UNIT) CAPS capsule Take 50,000 Units by mouth once a week.     acetaminophen (TYLENOL) 325 MG tablet Take 650 mg by mouth every 6 (six) hours as needed for mild pain or moderate pain. (Patient not taking: Reported on 09/06/2021)     diphenhydramine-acetaminophen (TYLENOL PM) 25-500 MG TABS tablet Take 1 tablet by mouth at bedtime as needed (for sleep). (Patient not taking: Reported on 09/06/2021)     No current facility-administered medications for this visit.    REVIEW OF SYSTEMS:   Review of Systems  Constitutional:  Negative for appetite change (70%) and fatigue (50%).  HENT:   Negative for nosebleeds.   Respiratory:  Positive for shortness of breath. Negative for hemoptysis.   Gastrointestinal:  Positive for diarrhea. Negative for blood in stool.  Genitourinary:  Negative for hematuria and vaginal bleeding.   Neurological:  Positive for headaches and numbness.  Hematological:  Does not bruise/bleed easily.  Psychiatric/Behavioral:  Positive for depression.   All other systems reviewed and are negative.   PHYSICAL EXAMINATION: ECOG PERFORMANCE STATUS: 2 - Symptomatic, <50% confined to bed  Vitals:   09/06/21 1325  BP: 139/78  Pulse: (!) 113  Resp: 18  Temp: 98.5 F (36.9 C)  SpO2: 96%   There were no vitals filed for this visit. Physical Exam Vitals reviewed.  Constitutional:      Appearance: Normal appearance.     Comments: In wheelchair  Cardiovascular:     Rate and Rhythm: Normal rate and regular rhythm.     Pulses: Normal pulses.     Heart sounds: Normal heart  sounds.  Pulmonary:     Effort: Pulmonary effort is normal.     Breath sounds: Normal breath sounds.  Chest:     Chest wall: No tenderness.  Abdominal:     Tenderness: There is abdominal tenderness in the right upper quadrant and right lower quadrant.  Musculoskeletal:     Left lower leg: 2+ Edema present.  Neurological:     General: No focal deficit present.     Mental Status: She is alert. She is disoriented.     Comments: Oriented to time, but not place or person  Psychiatric:        Mood and Affect: Mood normal.        Behavior: Behavior normal.  LABORATORY DATA:  I have reviewed the data as listed CBC Latest Ref Rng & Units 08/22/2021 08/21/2021 08/20/2021  WBC 4.0 - 10.5 K/uL 4.8 4.7 5.6  Hemoglobin 12.0 - 15.0 g/dL 10.5(L) 10.9(L) 10.9(L)  Hematocrit 36.0 - 46.0 % 32.9(L) 34.2(L) 32.5(L)  Platelets 150 - 400 K/uL 274 257 252   CMP Latest Ref Rng & Units 08/20/2021 08/19/2021 08/18/2021  Glucose 70 - 99 mg/dL 104(H) 116(H) 112(H)  BUN 8 - 23 mg/dL 18 17 18   Creatinine 0.44 - 1.00 mg/dL 0.56 0.52 0.56  Sodium 135 - 145 mmol/L 134(L) 135 134(L)  Potassium 3.5 - 5.1 mmol/L 3.5 3.8 3.9  Chloride 98 - 111 mmol/L 101 100 101  CO2 22 - 32 mmol/L 26 27 24   Calcium 8.9 - 10.3 mg/dL 8.5(L) 8.7(L) 8.6(L)  Total Protein 6.5 - 8.1 g/dL 6.1(L) 6.6 -  Total Bilirubin 0.3 - 1.2 mg/dL 0.4 0.8 -  Alkaline Phos 38 - 126 U/L 51 55 -  AST 15 - 41 U/L 24 26 -  ALT 0 - 44 U/L 27 24 -    RADIOGRAPHIC STUDIES: I have personally reviewed the radiological images as listed and agreed with the findings in the report. CT Angio Chest PE W and/or Wo Contrast  Result Date: 08/17/2021 CLINICAL DATA:  Positive right DVT with tachycardia EXAM: CT ANGIOGRAPHY CHEST WITH CONTRAST TECHNIQUE: Multidetector CT imaging of the chest was performed using the standard protocol during bolus administration of intravenous contrast. Multiplanar CT image reconstructions and MIPs were obtained to evaluate the  vascular anatomy. CONTRAST:  47mL OMNIPAQUE IOHEXOL 350 MG/ML SOLN COMPARISON:  None. FINDINGS: Cardiovascular: Satisfactory opacification of the pulmonary arteries to the segmental level. No evidence of pulmonary embolism. Pulmonary trunk measures 3.7 cm in diameter. Thoracic aorta is nonaneurysmal. Atherosclerotic calcifications of the aorta and coronary arteries. Heart size is upper limits of normal. No pericardial effusion. Mediastinum/Nodes: No axillary, mediastinal, or hilar lymphadenopathy. Multinodular thyroid gland with dominant nodule in the left thyroid lobe measuring at least 3.1 cm. Trachea and esophagus within normal limits. Lungs/Pleura: Lungs are clear. No pleural effusion or pneumothorax. Upper Abdomen: No acute abnormality. Musculoskeletal: Multiple areas of increased sclerosis within the sternum and manubrium. The posterior wall of the manubrium is ill-defined (series 8, images 75-78). No additional bony lesions are identified. No fracture. Multilevel degenerative changes within the thoracic spine. No chest wall abnormality. Review of the MIP images confirms the above findings. IMPRESSION: 1. No evidence of acute pulmonary embolism. 2. Multiple areas of increased sclerosis within the sternum and manubrium. The posterior wall of the manubrium is ill-defined. Findings are suspicious for metastatic disease versus myeloma. MRI of the sternum without and with intravenous contrast is recommended for further evaluation. Nucleolar medicine bone scan could also be performed to assess for additional lesions. 3. Multinodular thyroid gland with dominant nodule in the left thyroid lobe measuring at least 3.1 cm. Recommend nonemergent thyroid US (ref: J Am Coll Radiol. 2015 Feb;12(2): 143-50). 4. Aortic and coronary artery atherosclerosis (ICD10-I70.0). Electronically Signed   By: Davina Poke D.O.   On: 08/17/2021 11:36   US Venous Img Lower Unilateral Left (DVT)  Result Date: 08/17/2021 CLINICAL  DATA:  Left thigh and knee pain and edema. EXAM: LEFT LOWER EXTREMITY VENOUS DOPPLER ULTRASOUND TECHNIQUE: Gray-scale sonography with graded compression, as well as color Doppler and duplex ultrasound were performed to evaluate the lower extremity deep venous systems from the level of the common femoral vein and including the common femoral, femoral, profunda  femoral, popliteal and calf veins including the posterior tibial, peroneal and gastrocnemius veins when visible. The superficial great saphenous vein was also interrogated. Spectral Doppler was utilized to evaluate flow at rest and with distal augmentation maneuvers in the common femoral, femoral and popliteal veins. COMPARISON:  None. FINDINGS: Contralateral Common Femoral Vein: Respiratory phasicity is normal and symmetric with the symptomatic side. No evidence of thrombus. Normal compressibility. Common Femoral Vein: No evidence of thrombus. Normal compressibility, respiratory phasicity and response to augmentation. Saphenofemoral Junction: No evidence of thrombus. Normal compressibility and flow on color Doppler imaging. Profunda Femoral Vein: No evidence of thrombus. Normal compressibility and flow on color Doppler imaging. Femoral Vein: Nonocclusive thrombus is seen throughout the course of the femoral vein in the left thigh. Popliteal Vein: No evidence of thrombus. Normal compressibility, respiratory phasicity and response to augmentation. Calf Veins: No thrombus identified in the visualized posterior tibial or peroneal veins. There is thrombus seen in a gastrocnemius/soleal vein. Superficial Great Saphenous Vein: No evidence of thrombus. Normal compressibility. Venous Reflux:  None. Other Findings: No evidence of superficial thrombophlebitis or abnormal fluid collection. IMPRESSION: Positive for left lower extremity DVT with nonocclusive thrombus seen throughout the left femoral vein and thrombus identified in a gastrocnemius/soleal vein. Electronically  Signed   By: Aletta Edouard M.D.   On: 08/17/2021 10:22   DG CHEST PORT 1 VIEW  Result Date: 08/18/2021 CLINICAL DATA:  Fever EXAM: PORTABLE CHEST 1 VIEW COMPARISON:  08/17/2021 FINDINGS: Heart size is upper limits of normal. Aortic atherosclerosis. No focal airspace consolidation, pleural effusion, or pneumothorax. IMPRESSION: No active disease. Electronically Signed   By: Davina Poke D.O.   On: 08/18/2021 17:59   DG Knee Complete 4 Views Left  Result Date: 09/04/2021 CLINICAL DATA:  Pain left knee EXAM: LEFT KNEE - COMPLETE 4+ VIEW COMPARISON:  08/17/2021 FINDINGS: No recent fracture is seen. There is no significant effusion. Osteopenia is seen in bony structures. Degenerative changes are noted with bony spurs in medial, lateral and patellofemoral compartments, more severe in the patellofemoral compartment. In the lateral view, there is mild indentation in the anterior cortical margin of distal shaft of femur without break in the cortical margins. This finding may be a congenital variation or residual change from previous injury. Small smooth marginated calcifications along the lateral margin of patella may be residual from previous injury. IMPRESSION: No recent fracture or dislocation is seen. Significant degenerative changes are noted, more severe in the patellofemoral compartment. There is no significant effusion. Electronically Signed   By: Elmer Picker M.D.   On: 09/04/2021 10:59   DG Knee Complete 4 Views Left  Result Date: 08/17/2021 CLINICAL DATA:  Reactive EXAM: LEFT KNEE - COMPLETE 4+ VIEW COMPARISON:  None. FINDINGS: No recent fracture is seen. There is no significant effusion in the suprapatellar bursa. Marked degenerative changes are noted in radial lateral patellofemoral compartments, more so in the patellofemoral compartment osteopenia is seen in both hips. There are no opaque foreign bodies. IMPRESSION: No recent fracture is seen. Marked degenerative changes are noted,  more so in the patellofemoral compartment. Osteopenia. Reading location: [place], [place]. Electronically Signed   By: Elmer Picker   On: 08/17/2021 10:57   ECHOCARDIOGRAM COMPLETE  Result Date: 08/19/2021    ECHOCARDIOGRAM REPORT   Patient Name:   [name] Date of Exam: 08/19/2021 Medical Rec #:  [id number]       Height:       69.0 in Accession #:    [id number]  Weight:       184.5 lb Date of Birth:  [birth year]      BSA:          1.996 m Patient Age:    23 years        BP:           120/73 mmHg Patient Gender: F               HR:           84 bpm. Exam Location:  [facility] Procedure: 2D Echo, Cardiac Doppler and Color Doppler Indications:    Abnormal ECG  History:        Patient has no prior history of Echocardiogram examinations.                 Risk Factors:Hypertension and Dyslipidemia.  Sonographer:    Wenda Low Referring Phys: Eunice  1. Left ventricular ejection fraction, by estimation, is 60 to 65%. The left ventricle has normal function. The left ventricle has no regional wall motion abnormalities. There is severe left ventricular hypertrophy of the basal-septal segment. Left ventricular diastolic parameters are consistent with Grade I diastolic dysfunction (impaired relaxation).  2. Right ventricular systolic function is normal. The right ventricular size is normal. There is normal pulmonary artery systolic pressure.  3. The mitral valve is normal in structure. Mild mitral valve regurgitation. No evidence of mitral stenosis.  4. The aortic valve was not well visualized. Aortic valve regurgitation is not visualized. No aortic stenosis is present.  5. The inferior vena cava is normal in size with greater than 50% respiratory variability, suggesting right atrial pressure of 3 mmHg. FINDINGS  Left Ventricle: Severe basal septal hypertrophy, basal septum measures 1.9 cm. Left ventricular ejection fraction, by estimation, is 60 to 65%. The left ventricle  has normal function. The left ventricle has no regional wall motion abnormalities. The left ventricular internal cavity size was normal in size. There is severe left ventricular hypertrophy of the basal-septal segment. Left ventricular diastolic parameters are consistent with Grade I diastolic dysfunction (impaired relaxation). Right Ventricle: The right ventricular size is normal. No increase in right ventricular wall thickness. Right ventricular systolic function is normal. There is normal pulmonary artery systolic pressure. The tricuspid regurgitant velocity is 2.25 m/s, and  with an assumed right atrial pressure of 3 mmHg, the estimated right ventricular systolic pressure is 19.6 mmHg. Left Atrium: Left atrial size was normal in size. Right Atrium: Right atrial size was normal in size. Pericardium: There is no evidence of pericardial effusion. Mitral Valve: The mitral valve is normal in structure. Mild mitral valve regurgitation. No evidence of mitral valve stenosis. MV peak gradient, 7.1 mmHg. The mean mitral valve gradient is 3.0 mmHg. Tricuspid Valve: The tricuspid valve is normal in structure. Tricuspid valve regurgitation is mild . No evidence of tricuspid stenosis. Aortic Valve: The aortic valve was not well visualized. Aortic valve regurgitation is not visualized. No aortic stenosis is present. Aortic valve mean gradient measures 7.0 mmHg. Aortic valve peak gradient measures 13.1 mmHg. Aortic valve area, by VTI measures 1.68 cm. Pulmonic Valve: The pulmonic valve was not well visualized. Pulmonic valve regurgitation is not visualized. No evidence of pulmonic stenosis. Aorta: The aortic root is normal in size and structure. Venous: The inferior vena cava is normal in size with greater than 50% respiratory variability, suggesting right atrial pressure of 3 mmHg. IAS/Shunts: No atrial level shunt detected by color flow Doppler.  LEFT  VENTRICLE PLAX 2D LVIDd:         3.40 cm     Diastology LVIDs:          2.30 cm     LV e' medial:    8.05 cm/s LV PW:         0.80 cm     LV E/e' medial:  7.2 LV IVS:        1.30 cm     LV e' lateral:   7.94 cm/s LVOT diam:     1.90 cm     LV E/e' lateral: 7.3 LV SV:         53 LV SV Index:   27 LVOT Area:     2.84 cm  LV Volumes (MOD) LV vol d, MOD A2C: 38.1 ml LV vol d, MOD A4C: 46.6 ml LV vol s, MOD A2C: 14.3 ml LV vol s, MOD A4C: 16.8 ml LV SV MOD A2C:     23.8 ml LV SV MOD A4C:     46.6 ml LV SV MOD BP:      26.5 ml RIGHT VENTRICLE RV Basal diam:  3.30 cm RV Mid diam:    2.90 cm TAPSE (M-mode): 2.2 cm LEFT ATRIUM             Index        RIGHT ATRIUM           Index LA diam:        3.10 cm 1.55 cm/m   RA Area:     14.20 cm LA Vol (A2C):   69.5 ml 34.81 ml/m  RA Volume:   35.80 ml  17.93 ml/m LA Vol (A4C):   44.4 ml 22.24 ml/m LA Biplane Vol: 59.6 ml 29.85 ml/m  AORTIC VALVE AV Area (Vmax):    1.43 cm AV Area (Vmean):   1.38 cm AV Area (VTI):     1.68 cm AV Vmax:           181.00 cm/s AV Vmean:          124.000 cm/s AV VTI:            0.317 m AV Peak Grad:      13.1 mmHg AV Mean Grad:      7.0 mmHg LVOT Vmax:         91.40 cm/s LVOT Vmean:        60.400 cm/s LVOT VTI:          0.188 m LVOT/AV VTI ratio: 0.59  AORTA Ao Root diam: 2.40 cm MITRAL VALVE                TRICUSPID VALVE MV Area (PHT): 4.49 cm     TR Peak grad:   20.2 mmHg MV Area VTI:   2.48 cm     TR Vmax:        225.00 cm/s MV Peak grad:  7.1 mmHg MV Mean grad:  3.0 mmHg     SHUNTS MV Vmax:       1.33 m/s     Systemic VTI:  0.19 m MV Vmean:      70.9 cm/s    Systemic Diam: 1.90 cm MV Decel Time: 169 msec MV E velocity: 57.80 cm/s MV A velocity: 118.00 cm/s MV E/A ratio:  0.49 Carlyle Dolly MD Electronically signed by Carlyle Dolly MD Signature Date/Time: 08/19/2021/3:17:17 PM    Final     ASSESSMENT:  Sternal sclerotic lesions: - Incidental finding on CT angiogram  dated 08/17/2021 with multiple areas of increased sclerosis within the sternum and manubrium.  Posterior wall of the manubrium is  ill-defined.  She does not report any trauma or surgery to this area.   Acute DVT left lower extremity: - Doppler on 08/17/2021 positive for left leg DVT with nonocclusive thrombus seen throughout the left femoral vein and gastrocnemius/soleal vein. - CT angiogram on 08/17/2021 negative for pulmonary embolism. - She was started on Eliquis.   Social/family history: - She is currently residing at a nursing home. - She worked on a tobacco farm.  She smoked for few years and is quit now. - Maternal aunt had cancer.  Type not known to the patient.   PLAN:  Sternal sclerotic lesions: - We have discussed the findings on the scan in detail.  She does not have any prior history of malignancies.  No pain in the sternal region reported.  She does not report any weight loss.  She has good appetite. - Recommend bone scan. - Recommend work-up including SPEP, immunofixation, free light chains, LDH. - RTC 3 weeks for follow-up.   Acute DVT left lower extremity: - She is tolerating Eliquis reasonably well without any bleeding issues. - From the history, it appears to be unprovoked.  No indication for further testing.  Consider indefinite anticoagulation for now.  3.  Normocytic anemia: - Recent hemoglobin is 10.5 with normal creatinine. - We will check for nutritional deficiencies including ferritin, iron panel, V78 and folic acid.   All questions were answered. The patient knows to call the clinic with any problems, questions or concerns.   Derek Jack, MD, 09/06/21 5:35 PM  [employer] [phone]   I, Thana Ates, am acting as a scribe for Dr. Derek Jack.  I, Derek Jack MD, have reviewed the above documentation for accuracy and completeness, and I agree with the above.

## 2021-09-06 ENCOUNTER — Inpatient Hospital Stay (HOSPITAL_COMMUNITY): Payer: Medicare Other

## 2021-09-06 ENCOUNTER — Other Ambulatory Visit: Payer: Self-pay

## 2021-09-06 ENCOUNTER — Inpatient Hospital Stay (HOSPITAL_COMMUNITY): Payer: Medicare Other | Attending: Hematology | Admitting: Hematology

## 2021-09-06 VITALS — BP 139/78 | HR 113 | Temp 98.5°F | Resp 18

## 2021-09-06 DIAGNOSIS — D649 Anemia, unspecified: Secondary | ICD-10-CM

## 2021-09-06 DIAGNOSIS — Z87891 Personal history of nicotine dependence: Secondary | ICD-10-CM | POA: Diagnosis not present

## 2021-09-06 DIAGNOSIS — I82412 Acute embolism and thrombosis of left femoral vein: Secondary | ICD-10-CM | POA: Insufficient documentation

## 2021-09-06 DIAGNOSIS — I82462 Acute embolism and thrombosis of left calf muscular vein: Secondary | ICD-10-CM | POA: Diagnosis not present

## 2021-09-06 DIAGNOSIS — M899 Disorder of bone, unspecified: Secondary | ICD-10-CM

## 2021-09-06 DIAGNOSIS — Z7901 Long term (current) use of anticoagulants: Secondary | ICD-10-CM | POA: Diagnosis not present

## 2021-09-06 DIAGNOSIS — Z809 Family history of malignant neoplasm, unspecified: Secondary | ICD-10-CM | POA: Diagnosis not present

## 2021-09-06 LAB — IRON AND TIBC
Iron: 20 ug/dL — ABNORMAL LOW (ref 28–170)
Saturation Ratios: 13 % (ref 10.4–31.8)
TIBC: 158 ug/dL — ABNORMAL LOW (ref 250–450)
UIBC: 138 ug/dL

## 2021-09-06 LAB — VITAMIN B12: Vitamin B-12: 409 pg/mL (ref 180–914)

## 2021-09-06 LAB — FOLATE: Folate: 11.4 ng/mL (ref >5.9)

## 2021-09-06 LAB — FERRITIN: Ferritin: 356 ng/mL — ABNORMAL HIGH (ref 11–307)

## 2021-09-06 LAB — LACTATE DEHYDROGENASE: LDH: 158 U/L (ref 98–192)

## 2021-09-06 NOTE — Patient Instructions (Addendum)
[place] at [facility] Discharge Instructions   You were seen and examined today by Dr. Delton Coombes.  There was a lesion seen on your sternum on your last scan.  We will do a whole body scan to investigate this further.   We will also order additional blood tests to be drawn today.   Return as scheduled in 3 weeks to review the results of the lab work and bone scan.     Thank you for choosing [place] at [facility] to provide your oncology and hematology care.  To afford each patient quality time with our provider, please arrive at least 15 minutes before your scheduled appointment time.   If you have a lab appointment with the [facility] please come in thru the Main Entrance and check in at the main information desk.  You need to re-schedule your appointment should you arrive 10 or more minutes late.  We strive to give you quality time with our providers, and arriving late affects you and other patients whose appointments are after yours.  Also, if you no show three or more times for appointments you may be dismissed from the clinic at the providers discretion.     Again, thank you for choosing [facility].  Our hope is that these requests will decrease the amount of time that you wait before being seen by our physicians.       _____________________________________________________________  Should you have questions after your visit to [facility], please contact our office at [phone] and follow the prompts.  Our office hours are 8:00 a.m. and 4:30 p.m. Monday - Friday.  Please note that voicemails left after 4:00 p.m. may not be returned until the following business day.  We are closed weekends and major holidays.  You do have access to a nurse 24-7, just call the main number to the clinic [phone] and do not press any options, hold on the line and a nurse will answer the phone.    For prescription  refill requests, have your pharmacy contact our office and allow 72 hours.    Due to Covid, you will need to wear a mask upon entering the hospital. If you do not have a mask, a mask will be given to you at the Main Entrance upon arrival. For doctor visits, patients may have 1 support person age 30 or older with them. For treatment visits, patients can not have anyone with them due to social distancing guidelines and our immunocompromised population.

## 2021-09-07 LAB — KAPPA/LAMBDA LIGHT CHAINS
Kappa free light chain: 51.9 mg/L — ABNORMAL HIGH (ref 3.3–19.4)
Kappa, lambda light chain ratio: 1.63 (ref 0.26–1.65)
Lambda free light chains: 31.9 mg/L — ABNORMAL HIGH (ref 5.7–26.3)

## 2021-09-08 LAB — PROTEIN ELECTROPHORESIS, SERUM
A/G Ratio: 0.6 — ABNORMAL LOW (ref 0.7–1.7)
Albumin ELP: 2.7 g/dL — ABNORMAL LOW (ref 2.9–4.4)
Alpha-1-Globulin: 0.5 g/dL — ABNORMAL HIGH (ref 0.0–0.4)
Alpha-2-Globulin: 1.5 g/dL — ABNORMAL HIGH (ref 0.4–1.0)
Beta Globulin: 1.1 g/dL (ref 0.7–1.3)
Gamma Globulin: 1.8 g/dL (ref 0.4–1.8)
Globulin, Total: 4.8 g/dL — ABNORMAL HIGH (ref 2.2–3.9)
Total Protein ELP: 7.5 g/dL (ref 6.0–8.5)

## 2021-09-12 DIAGNOSIS — I82402 Acute embolism and thrombosis of unspecified deep veins of left lower extremity: Secondary | ICD-10-CM | POA: Diagnosis not present

## 2021-09-12 DIAGNOSIS — R52 Pain, unspecified: Secondary | ICD-10-CM | POA: Diagnosis not present

## 2021-09-12 DIAGNOSIS — I10 Essential (primary) hypertension: Secondary | ICD-10-CM | POA: Diagnosis not present

## 2021-09-12 DIAGNOSIS — Z79899 Other long term (current) drug therapy: Secondary | ICD-10-CM | POA: Diagnosis not present

## 2021-09-12 LAB — IMMUNOFIXATION ELECTROPHORESIS
IgA: 407 mg/dL (ref 64–422)
IgG (Immunoglobin G), Serum: 1734 mg/dL — ABNORMAL HIGH (ref 586–1602)
IgM (Immunoglobulin M), Srm: 93 mg/dL (ref 26–217)
Total Protein ELP: 10.7 g/dL — ABNORMAL HIGH (ref 6.0–8.5)

## 2021-09-16 DIAGNOSIS — I82409 Acute embolism and thrombosis of unspecified deep veins of unspecified lower extremity: Secondary | ICD-10-CM | POA: Diagnosis not present

## 2021-09-16 DIAGNOSIS — E785 Hyperlipidemia, unspecified: Secondary | ICD-10-CM | POA: Diagnosis not present

## 2021-09-17 ENCOUNTER — Inpatient Hospital Stay (HOSPITAL_COMMUNITY): Payer: Medicare Other

## 2021-09-17 ENCOUNTER — Other Ambulatory Visit: Payer: Self-pay

## 2021-09-17 ENCOUNTER — Encounter (HOSPITAL_COMMUNITY): Payer: Self-pay | Admitting: Emergency Medicine

## 2021-09-17 ENCOUNTER — Inpatient Hospital Stay (HOSPITAL_COMMUNITY)
Admission: EM | Admit: 2021-09-17 | Discharge: 2021-09-22 | DRG: 689 | Disposition: A | Discharge status: Skilled Nursing Facility | Payer: Medicare Other | Source: Skilled Nursing Facility | Attending: Internal Medicine | Admitting: Internal Medicine

## 2021-09-17 ENCOUNTER — Emergency Department (HOSPITAL_COMMUNITY): Payer: Medicare Other

## 2021-09-17 DIAGNOSIS — N390 Urinary tract infection, site not specified: Secondary | ICD-10-CM | POA: Diagnosis not present

## 2021-09-17 DIAGNOSIS — G9341 Metabolic encephalopathy: Secondary | ICD-10-CM | POA: Diagnosis not present

## 2021-09-17 DIAGNOSIS — I700 Atherosclerosis of aorta: Secondary | ICD-10-CM | POA: Diagnosis not present

## 2021-09-17 DIAGNOSIS — K3189 Other diseases of stomach and duodenum: Secondary | ICD-10-CM | POA: Diagnosis not present

## 2021-09-17 DIAGNOSIS — R52 Pain, unspecified: Secondary | ICD-10-CM

## 2021-09-17 DIAGNOSIS — L89152 Pressure ulcer of sacral region, stage 2: Secondary | ICD-10-CM | POA: Diagnosis present

## 2021-09-17 DIAGNOSIS — Z86718 Personal history of other venous thrombosis and embolism: Secondary | ICD-10-CM

## 2021-09-17 DIAGNOSIS — E162 Hypoglycemia, unspecified: Secondary | ICD-10-CM | POA: Diagnosis not present

## 2021-09-17 DIAGNOSIS — E43 Unspecified severe protein-calorie malnutrition: Secondary | ICD-10-CM | POA: Diagnosis present

## 2021-09-17 DIAGNOSIS — Z823 Family history of stroke: Secondary | ICD-10-CM | POA: Diagnosis not present

## 2021-09-17 DIAGNOSIS — I499 Cardiac arrhythmia, unspecified: Secondary | ICD-10-CM | POA: Diagnosis not present

## 2021-09-17 DIAGNOSIS — Z9841 Cataract extraction status, right eye: Secondary | ICD-10-CM

## 2021-09-17 DIAGNOSIS — Z9842 Cataract extraction status, left eye: Secondary | ICD-10-CM

## 2021-09-17 DIAGNOSIS — Z20822 Contact with and (suspected) exposure to covid-19: Secondary | ICD-10-CM | POA: Diagnosis present

## 2021-09-17 DIAGNOSIS — Z7189 Other specified counseling: Secondary | ICD-10-CM | POA: Diagnosis not present

## 2021-09-17 DIAGNOSIS — E785 Hyperlipidemia, unspecified: Secondary | ICD-10-CM | POA: Diagnosis present

## 2021-09-17 DIAGNOSIS — R404 Transient alteration of awareness: Secondary | ICD-10-CM | POA: Diagnosis not present

## 2021-09-17 DIAGNOSIS — Z825 Family history of asthma and other chronic lower respiratory diseases: Secondary | ICD-10-CM | POA: Diagnosis not present

## 2021-09-17 DIAGNOSIS — R000 Tachycardia, unspecified: Secondary | ICD-10-CM | POA: Diagnosis not present

## 2021-09-17 DIAGNOSIS — K6389 Other specified diseases of intestine: Secondary | ICD-10-CM | POA: Diagnosis not present

## 2021-09-17 DIAGNOSIS — E876 Hypokalemia: Secondary | ICD-10-CM | POA: Diagnosis present

## 2021-09-17 DIAGNOSIS — I10 Essential (primary) hypertension: Secondary | ICD-10-CM | POA: Diagnosis present

## 2021-09-17 DIAGNOSIS — N281 Cyst of kidney, acquired: Secondary | ICD-10-CM | POA: Diagnosis not present

## 2021-09-17 DIAGNOSIS — Z6824 Body mass index (BMI) 24.0-24.9, adult: Secondary | ICD-10-CM

## 2021-09-17 DIAGNOSIS — Z7901 Long term (current) use of anticoagulants: Secondary | ICD-10-CM | POA: Diagnosis not present

## 2021-09-17 DIAGNOSIS — E7800 Pure hypercholesterolemia, unspecified: Secondary | ICD-10-CM | POA: Diagnosis present

## 2021-09-17 DIAGNOSIS — R4182 Altered mental status, unspecified: Secondary | ICD-10-CM | POA: Diagnosis not present

## 2021-09-17 DIAGNOSIS — D890 Polyclonal hypergammaglobulinemia: Secondary | ICD-10-CM | POA: Diagnosis present

## 2021-09-17 DIAGNOSIS — A419 Sepsis, unspecified organism: Secondary | ICD-10-CM | POA: Diagnosis not present

## 2021-09-17 DIAGNOSIS — F39 Unspecified mood [affective] disorder: Secondary | ICD-10-CM | POA: Diagnosis not present

## 2021-09-17 DIAGNOSIS — G9340 Encephalopathy, unspecified: Secondary | ICD-10-CM | POA: Diagnosis not present

## 2021-09-17 DIAGNOSIS — R6520 Severe sepsis without septic shock: Secondary | ICD-10-CM | POA: Diagnosis not present

## 2021-09-17 DIAGNOSIS — L8990 Pressure ulcer of unspecified site, unspecified stage: Secondary | ICD-10-CM | POA: Insufficient documentation

## 2021-09-17 DIAGNOSIS — M899 Disorder of bone, unspecified: Secondary | ICD-10-CM | POA: Diagnosis not present

## 2021-09-17 DIAGNOSIS — D75839 Thrombocytosis, unspecified: Secondary | ICD-10-CM | POA: Diagnosis present

## 2021-09-17 DIAGNOSIS — F29 Unspecified psychosis not due to a substance or known physiological condition: Secondary | ICD-10-CM | POA: Diagnosis not present

## 2021-09-17 DIAGNOSIS — I82439 Acute embolism and thrombosis of unspecified popliteal vein: Secondary | ICD-10-CM | POA: Diagnosis not present

## 2021-09-17 DIAGNOSIS — Z79899 Other long term (current) drug therapy: Secondary | ICD-10-CM | POA: Diagnosis not present

## 2021-09-17 DIAGNOSIS — M25552 Pain in left hip: Secondary | ICD-10-CM | POA: Diagnosis not present

## 2021-09-17 DIAGNOSIS — Z515 Encounter for palliative care: Secondary | ICD-10-CM | POA: Diagnosis not present

## 2021-09-17 DIAGNOSIS — Z961 Presence of intraocular lens: Secondary | ICD-10-CM | POA: Diagnosis present

## 2021-09-17 DIAGNOSIS — R600 Localized edema: Secondary | ICD-10-CM | POA: Diagnosis not present

## 2021-09-17 DIAGNOSIS — R609 Edema, unspecified: Secondary | ICD-10-CM

## 2021-09-17 DIAGNOSIS — R509 Fever, unspecified: Secondary | ICD-10-CM | POA: Diagnosis not present

## 2021-09-17 DIAGNOSIS — F03911 Unspecified dementia, unspecified severity, with agitation: Secondary | ICD-10-CM | POA: Diagnosis not present

## 2021-09-17 DIAGNOSIS — I82402 Acute embolism and thrombosis of unspecified deep veins of left lower extremity: Secondary | ICD-10-CM | POA: Diagnosis not present

## 2021-09-17 DIAGNOSIS — F0393 Unspecified dementia, unspecified severity, with mood disturbance: Secondary | ICD-10-CM | POA: Diagnosis present

## 2021-09-17 DIAGNOSIS — G464 Cerebellar stroke syndrome: Secondary | ICD-10-CM | POA: Diagnosis not present

## 2021-09-17 LAB — COMPREHENSIVE METABOLIC PANEL
ALT: 43 U/L (ref 0–44)
AST: 44 U/L — ABNORMAL HIGH (ref 15–41)
Albumin: 2.2 g/dL — ABNORMAL LOW (ref 3.5–5.0)
Alkaline Phosphatase: 53 U/L (ref 38–126)
Anion gap: 14 (ref 5–15)
BUN: 31 mg/dL — ABNORMAL HIGH (ref 8–23)
CO2: 23 mmol/L (ref 22–32)
Calcium: 9.3 mg/dL (ref 8.9–10.3)
Chloride: 106 mmol/L (ref 98–111)
Creatinine, Ser: 0.61 mg/dL (ref 0.44–1.00)
GFR, Estimated: >60 mL/min (ref >60)
Glucose, Bld: 125 mg/dL — ABNORMAL HIGH (ref 70–99)
Potassium: 3.1 mmol/L — ABNORMAL LOW (ref 3.5–5.1)
Sodium: 143 mmol/L (ref 135–145)
Total Bilirubin: 1.1 mg/dL (ref 0.3–1.2)
Total Protein: 6.8 g/dL (ref 6.5–8.1)

## 2021-09-17 LAB — URINALYSIS, ROUTINE W REFLEX MICROSCOPIC
Glucose, UA: NEGATIVE mg/dL
Hgb urine dipstick: NEGATIVE
Ketones, ur: 40 mg/dL — AB
Leukocytes,Ua: NEGATIVE
Nitrite: NEGATIVE
Protein, ur: 30 mg/dL — AB
Specific Gravity, Urine: >1.03 — ABNORMAL HIGH (ref 1.005–1.030)
pH: 6 (ref 5.0–8.0)

## 2021-09-17 LAB — CBC WITH DIFFERENTIAL/PLATELET
Abs Immature Granulocytes: 0.04 10*3/uL (ref 0.00–0.07)
Basophils Absolute: 0 10*3/uL (ref 0.0–0.1)
Basophils Relative: 0 %
Eosinophils Absolute: 0 10*3/uL (ref 0.0–0.5)
Eosinophils Relative: 0 %
HCT: 35.9 % — ABNORMAL LOW (ref 36.0–46.0)
Hemoglobin: 11.3 g/dL — ABNORMAL LOW (ref 12.0–15.0)
Immature Granulocytes: 0 %
Lymphocytes Relative: 12 %
Lymphs Abs: 1.2 10*3/uL (ref 0.7–4.0)
MCH: 27.2 pg (ref 26.0–34.0)
MCHC: 31.5 g/dL (ref 30.0–36.0)
MCV: 86.3 fL (ref 80.0–100.0)
Monocytes Absolute: 1 10*3/uL (ref 0.1–1.0)
Monocytes Relative: 10 %
Neutro Abs: 7.6 10*3/uL (ref 1.7–7.7)
Neutrophils Relative %: 78 %
Platelets: 491 10*3/uL — ABNORMAL HIGH (ref 150–400)
RBC: 4.16 MIL/uL (ref 3.87–5.11)
RDW: 13.4 % (ref 11.5–15.5)
WBC: 9.9 10*3/uL (ref 4.0–10.5)
nRBC: 0 % (ref 0.0–0.2)

## 2021-09-17 LAB — URINALYSIS, MICROSCOPIC (REFLEX)

## 2021-09-17 LAB — RESP PANEL BY RT-PCR (FLU A&B, COVID) ARPGX2
Influenza A by PCR: NEGATIVE
Influenza B by PCR: NEGATIVE
SARS Coronavirus 2 by RT PCR: NEGATIVE

## 2021-09-17 LAB — PROTIME-INR
INR: 2.5 — ABNORMAL HIGH (ref 0.8–1.2)
Prothrombin Time: 26.8 seconds — ABNORMAL HIGH (ref 11.4–15.2)

## 2021-09-17 LAB — APTT: aPTT: 38 seconds — ABNORMAL HIGH (ref 24–36)

## 2021-09-17 LAB — PROCALCITONIN: Procalcitonin: <0.1 ng/mL

## 2021-09-17 LAB — LACTIC ACID, PLASMA
Lactic Acid, Venous: 2.3 mmol/L (ref 0.5–1.9)
Lactic Acid, Venous: 2 mmol/L (ref 0.5–1.9)

## 2021-09-17 LAB — MRSA NEXT GEN BY PCR, NASAL: MRSA by PCR Next Gen: DETECTED — AB

## 2021-09-17 MED ORDER — QUETIAPINE FUMARATE 25 MG PO TABS: 25.0000 mg | ORAL_TABLET | Freq: Every day | ORAL | Status: DC

## 2021-09-17 MED ORDER — LACTATED RINGERS IV BOLUS
1000.0000 mL | Freq: Once | INTRAVENOUS | Status: AC
  Administered 2021-09-17: 1000 mL via INTRAVENOUS

## 2021-09-17 MED ORDER — VITAMIN D (ERGOCALCIFEROL) 1.25 MG (50000 UNIT) PO CAPS
50000.0000 [IU] | ORAL_CAPSULE | ORAL | Status: DC
  Administered 2021-09-17: 50000 [IU] via ORAL
  Filled 2021-09-17 (×4): qty 1

## 2021-09-17 MED ORDER — LORAZEPAM 2 MG/ML IJ SOLN
2.0000 mg | Freq: Once | INTRAMUSCULAR | Status: AC
  Administered 2021-09-17: 2 mg via INTRAVENOUS
  Filled 2021-09-17: qty 1

## 2021-09-17 MED ORDER — PRAVASTATIN SODIUM 10 MG PO TABS
20.0000 mg | ORAL_TABLET | Freq: Every day | ORAL | Status: DC
  Administered 2021-09-19 – 2021-09-21 (×3): 20 mg via ORAL
  Filled 2021-09-17 (×4): qty 2

## 2021-09-17 MED ORDER — QUETIAPINE FUMARATE 25 MG PO TABS
25.0000 mg | ORAL_TABLET | Freq: Two times a day (BID) | ORAL | Status: DC
  Administered 2021-09-17 – 2021-09-18 (×3): 25 mg via ORAL
  Filled 2021-09-17 (×3): qty 1

## 2021-09-17 MED ORDER — ONDANSETRON HCL 4 MG PO TABS: 4.0000 mg | ORAL_TABLET | Freq: Four times a day (QID) | ORAL | Status: DC | PRN

## 2021-09-17 MED ORDER — ONDANSETRON HCL 4 MG/2ML IJ SOLN
4.0000 mg | Freq: Four times a day (QID) | INTRAMUSCULAR | Status: DC | PRN
  Filled 2021-09-17: qty 2

## 2021-09-17 MED ORDER — POTASSIUM CHLORIDE 10 MEQ/100ML IV SOLN
10.0000 meq | INTRAVENOUS | Status: AC
  Administered 2021-09-17 (×4): 10 meq via INTRAVENOUS
  Filled 2021-09-17 (×4): qty 100

## 2021-09-17 MED ORDER — SODIUM CHLORIDE 0.9 % IV SOLN
2.0000 g | Freq: Once | INTRAVENOUS | Status: AC
  Administered 2021-09-17: 2 g via INTRAVENOUS
  Filled 2021-09-17: qty 2

## 2021-09-17 MED ORDER — ACETAMINOPHEN 650 MG RE SUPP
650.0000 mg | Freq: Four times a day (QID) | RECTAL | Status: DC | PRN
  Administered 2021-09-18: 650 mg via RECTAL
  Filled 2021-09-17: qty 1

## 2021-09-17 MED ORDER — OXYCODONE HCL 5 MG PO TABS
5.0000 mg | ORAL_TABLET | ORAL | Status: DC | PRN
  Administered 2021-09-17 – 2021-09-22 (×6): 5 mg via ORAL
  Filled 2021-09-17 (×7): qty 1

## 2021-09-17 MED ORDER — APIXABAN 5 MG PO TABS
5.0000 mg | ORAL_TABLET | Freq: Two times a day (BID) | ORAL | Status: DC
  Administered 2021-09-17 – 2021-09-22 (×11): 5 mg via ORAL
  Filled 2021-09-17 (×11): qty 1

## 2021-09-17 MED ORDER — SODIUM CHLORIDE 0.9 % IV SOLN: INTRAVENOUS | Status: DC

## 2021-09-17 MED ORDER — ZIPRASIDONE MESYLATE 20 MG IM SOLR
10.0000 mg | Freq: Four times a day (QID) | INTRAMUSCULAR | Status: DC | PRN
  Filled 2021-09-17 (×2): qty 20

## 2021-09-17 MED ORDER — VANCOMYCIN HCL 1500 MG/300ML IV SOLN
1500.0000 mg | INTRAVENOUS | Status: DC
  Administered 2021-09-17 – 2021-09-18 (×2): 1500 mg via INTRAVENOUS
  Filled 2021-09-17 (×2): qty 300

## 2021-09-17 MED ORDER — VANCOMYCIN HCL 1500 MG/300ML IV SOLN
1500.0000 mg | Freq: Once | INTRAVENOUS | Status: AC
  Administered 2021-09-17: 1500 mg via INTRAVENOUS
  Filled 2021-09-17: qty 300

## 2021-09-17 MED ORDER — SODIUM CHLORIDE 0.9 % IV SOLN
2.0000 g | Freq: Three times a day (TID) | INTRAVENOUS | Status: DC
  Administered 2021-09-17 – 2021-09-20 (×9): 2 g via INTRAVENOUS
  Filled 2021-09-17 (×9): qty 2

## 2021-09-17 MED ORDER — ACETAMINOPHEN 325 MG PO TABS
650.0000 mg | ORAL_TABLET | Freq: Four times a day (QID) | ORAL | Status: DC | PRN
  Administered 2021-09-20: 650 mg via ORAL
  Filled 2021-09-17: qty 2

## 2021-09-17 MED ORDER — ACETAMINOPHEN 325 MG PO TABS
650.0000 mg | ORAL_TABLET | Freq: Once | ORAL | Status: AC
  Administered 2021-09-17: 650 mg via ORAL
  Filled 2021-09-17: qty 2

## 2021-09-17 MED ORDER — HALOPERIDOL LACTATE 5 MG/ML IJ SOLN
2.0000 mg | Freq: Once | INTRAMUSCULAR | Status: AC
  Administered 2021-09-17: 2 mg via INTRAVENOUS
  Filled 2021-09-17: qty 1

## 2021-09-17 MED ORDER — MUPIROCIN 2 % EX OINT
1.0000 "application " | TOPICAL_OINTMENT | Freq: Two times a day (BID) | CUTANEOUS | Status: AC
  Administered 2021-09-17 – 2021-09-21 (×10): 1 via NASAL
  Filled 2021-09-17: qty 22

## 2021-09-17 MED ORDER — METOPROLOL TARTRATE 25 MG PO TABS
25.0000 mg | ORAL_TABLET | Freq: Two times a day (BID) | ORAL | Status: DC
  Administered 2021-09-17 – 2021-09-19 (×5): 25 mg via ORAL
  Filled 2021-09-17 (×5): qty 1

## 2021-09-17 MED ORDER — CHLORHEXIDINE GLUCONATE CLOTH 2 % EX PADS
6.0000 | MEDICATED_PAD | Freq: Every day | CUTANEOUS | Status: DC
  Administered 2021-09-18: 09:00:00 6 via TOPICAL

## 2021-09-17 MED ORDER — CEFEPIME HCL 2 G IJ SOLR: 2.0000 g | Freq: Two times a day (BID) | INTRAMUSCULAR | Status: DC

## 2021-09-17 NOTE — ED Triage Notes (Signed)
Pt brought in via EMS for AMS from [facility]. Pt given 0.5 mg Ativan by facility prior to leaving with EMS.

## 2021-09-17 NOTE — H&P (Signed)
TRH H&P    Patient Demographics:    [name], is a 80 y.o. female  MRN: [id number]  DOB - 10-Aug-1941  Admit Date - 09/17/2021  Referring MD/NP/PA: [place]  Outpatient Primary MD for the patient is Joyice Faster, [place]  Patient coming from: [facility]  Chief complaint- Altered mental status   HPI:    [name]  is a 80 y.o. female, with history of hypertension, hypercholesterolemia, depression, mood disorder, and more presents to ED with chief complaint of altered mental status.  Patient is responding to commands, and will answer some questions between episodes of falling asleep.  She is somnolent after Ativan.  She does not know why she is here in the ER.  When asked where she is she says she is in a group home.  She is oriented to herself but not to time or place.  Patient is not able to provide any history.  I attempted to reach staff at the [facility] without an answer.  When patient arrived in the ED she was tachycardic, tachypneic, febrile.  Sepsis protocol was started she was given a 3 L bolus.  She was started on vancomycin and cefepime.  Her chest x-ray shows no active disease.  Urine analysis is borderline.  Urine culture and blood cultures pending.  She has no leukocytosis but she does have a thrombocytosis of 491.  Her chemistry panel reveals a hypokalemia and a protein calorie malnutrition.  Her lactic acid was initially 2.3, was 2.0 on repeat but this was before the fluid bolus was finished.  She an EKG that showed sinus tachycardia with a heart rate of 131, QTc 454 no acute ischemic changes.  She was given Tylenol for fever, Ativan for agitation.  Geodon has been ordered but has not been used as of yet.  Admission was requested for work-up of sepsis.  Most likely source at this point is urine, but awaiting cultures.    Review of systems:    Unfortunately review of systems could not be  obtained secondary to altered mental status   Past History of the following :    Past Medical History:  Diagnosis Date   Arthritis    Depression    HTN (hypertension)    Hypercholesteremia    S/P colonoscopy 2009   adenoma, repeat 5 years      Past Surgical History:  Procedure Laterality Date   CATARACT EXTRACTION W/PHACO Right 04/14/2014   Procedure: CATARACT EXTRACTION PHACO AND INTRAOCULAR LENS PLACEMENT ([place]) CDE=6.22;  Surgeon: Elta Guadeloupe T. Gershon Crane, MD;  Location: [facility];  Service: Ophthalmology;  Laterality: Right;   CATARACT EXTRACTION W/PHACO Left 06/16/2014   Procedure: CATARACT EXTRACTION PHACO AND INTRAOCULAR LENS PLACEMENT ([facility]);  Surgeon: Elta Guadeloupe T. Gershon Crane, MD;  Location: [facility];  Service: Ophthalmology;  Laterality: Left;  CDE:8.01   COLONOSCOPY  02/17/2005   WIO:XBDZHG polyp and right colon polyp, status post snare polypectomy/Otherwise normal rectum and colon   COLONOSCOPY  02/18/2008   RMR: Normal rectum/Polyp in the hepatic flexure removed with snare cecal polyps  were cold biopsied/removed; remainder of the colonic mucosa appeared normal. Tubluar adenoma   COLONOSCOPY N/A 03/26/2013   Procedure: COLONOSCOPY;  Surgeon: Daneil Dolin, MD;  Location: [facility];  Service: Endoscopy;  Laterality: N/A;  1:15   COLONOSCOPY N/A 06/05/2018   Procedure: COLONOSCOPY;  Surgeon: Daneil Dolin, MD;  Location: [facility];  Service: Endoscopy;  Laterality: N/A;  10:30   TOTAL ABDOMINAL HYSTERECTOMY     fibroid tumors   UTERINE FIBROID SURGERY        Social History:      Social History   Tobacco Use   Smoking status: Never   Smokeless tobacco: Never  Substance Use Topics   Alcohol use: No       Family History :     Family History  Problem Relation Age of Onset   Stroke Brother    Stroke Mother        deceased   Emphysema Father        deceased   Colon cancer Neg Hx       Home Medications:   Prior to Admission medications   Medication Sig Start Date End  Date Taking? Authorizing Provider  acetaminophen (TYLENOL) 325 MG tablet Take 650 mg by mouth every 6 (six) hours as needed for mild pain or moderate pain. Patient not taking: Reported on 09/06/2021    [provider]  apixaban (ELIQUIS) 5 MG TABS tablet Take 10mg  po bid through 11/6 then take 5mg  po bid 08/22/21   Kathie Dike, MD  cetirizine (ZYRTEC) 10 MG tablet Take 10 mg by mouth daily.    [provider]  diphenhydramine-acetaminophen (TYLENOL PM) 25-500 MG TABS tablet Take 1 tablet by mouth at bedtime as needed (for sleep). Patient not taking: Reported on 09/06/2021    [provider]  lovastatin (MEVACOR) 20 MG tablet Take 20 mg by mouth at bedtime.      [provider]  metoprolol tartrate (LOPRESSOR) 25 MG tablet Take 1 tablet (25 mg total) by mouth 2 (two) times daily. 08/22/21   Kathie Dike, MD  ondansetron (ZOFRAN) 4 MG tablet Take 1 tablet (4 mg total) by mouth every 6 (six) hours. 10/26/18   Nat Christen, MD  QUEtiapine (SEROQUEL) 25 MG tablet Take 1 tablet (25 mg total) by mouth at bedtime. 08/22/21   Kathie Dike, MD  Vitamin D, Ergocalciferol, (DRISDOL) 1.25 MG (50000 UNIT) CAPS capsule Take 50,000 Units by mouth once a week. 07/01/21   [provider]     Allergies:    No Known Allergies   Physical Exam:   Vitals  Blood pressure 129/73, pulse (!) 105, temperature 99.6 F (37.6 C), temperature source Rectal, resp. rate 20, height 5\' 9"  (1.753 m), weight 76.4 kg, SpO2 97 %.   1.  General: Patient lying supine in bed,  no acute distress   2. Psychiatric: Alert and oriented x 1, pleasant and cooperative with exam   3. Neurologic: Speech and language are normal, face is symmetric, moves all 4 extremities voluntarily, following commands, protecting airway, encephalopathic   4. HEENMT:  Head is atraumatic, normocephalic, pupils reactive to light, neck is supple, trachea is midline, mucous membranes are dry with chapped  lips   5. Respiratory : Lungs are clear to auscultation bilaterally without wheezing, rhonchi, rales, no cyanosis, no increase in work of breathing or accessory muscle use   6. Cardiovascular : Heart rate normal, rhythm is regular, no murmurs, rubs or gallops, no peripheral pitting  edema, peripheral pulses palpated   7. Gastrointestinal:  Abdomen is soft, nondistended, nontender to palpation bowel sounds active, no masses or organomegaly palpated   8. Skin:  Skin is warm, dry and intact without rashes, acute lesions, or ulcers on limited exam   9.Musculoskeletal:  No acute deformities or trauma, no asymmetry in tone, left leg has asymmetric edema greater than right, peripheral pulses palpated, no tenderness to palpation in the extremities     Data Review:    CBC Recent Labs  Lab 09/17/21 0149  WBC 9.9  HGB 11.3*  HCT 35.9*  PLT 491*  MCV 86.3  MCH 27.2  MCHC 31.5  RDW 13.4  LYMPHSABS 1.2  MONOABS 1.0  EOSABS 0.0  BASOSABS 0.0   ------------------------------------------------------------------------------------------------------------------  Results for orders placed or performed during the hospital encounter of 09/17/21 (from the past 48 hour(s))  Comprehensive metabolic panel     Status: Abnormal   Collection Time: 09/17/21  1:49 AM  Result Value Ref Range   Sodium 143 135 - 145 mmol/L   Potassium 3.1 (L) 3.5 - 5.1 mmol/L   Chloride 106 98 - 111 mmol/L   CO2 23 22 - 32 mmol/L   Glucose, Bld 125 (H) 70 - 99 mg/dL    Comment: Glucose reference range applies only to samples taken after fasting for at least 8 hours.   BUN 31 (H) 8 - 23 mg/dL   Creatinine, Ser 0.61 0.44 - 1.00 mg/dL   Calcium 9.3 8.9 - 10.3 mg/dL   Total Protein 6.8 6.5 - 8.1 g/dL   Albumin 2.2 (L) 3.5 - 5.0 g/dL   AST 44 (H) 15 - 41 U/L   ALT 43 0 - 44 U/L   Alkaline Phosphatase 53 38 - 126 U/L   Total Bilirubin 1.1 0.3 - 1.2 mg/dL   GFR, Estimated >60 >60 mL/min    Comment:  (NOTE) Calculated using the CKD-EPI Creatinine Equation (2021)    Anion gap 14 5 - 15    Comment: Performed at [facility], [address], [place], [place] [address]  Lactic acid, plasma     Status: Abnormal   Collection Time: 09/17/21  1:49 AM  Result Value Ref Range   Lactic Acid, Venous 2.3 (HH) 0.5 - 1.9 mmol/L    Comment: CRITICAL RESULT CALLED TO, READ BACK BY AND VERIFIED WITH: WAKLER,T @ 0232 ON 09/17/21 BY JUW Performed at [facility], [address] [place], [place] [address]   CBC WITH DIFFERENTIAL     Status: Abnormal   Collection Time: 09/17/21  1:49 AM  Result Value Ref Range   WBC 9.9 4.0 - 10.5 K/uL   RBC 4.16 3.87 - 5.11 MIL/uL   Hemoglobin 11.3 (L) 12.0 - 15.0 g/dL   HCT 35.9 (L) 36.0 - 46.0 %   MCV 86.3 80.0 - 100.0 fL   MCH 27.2 26.0 - 34.0 pg   MCHC 31.5 30.0 - 36.0 g/dL   RDW 13.4 11.5 - 15.5 %   Platelets 491 (H) 150 - 400 K/uL   nRBC 0.0 0.0 - 0.2 %   Neutrophils Relative % 78 %   Neutro Abs 7.6 1.7 - 7.7 K/uL   Lymphocytes Relative 12 %   Lymphs Abs 1.2 0.7 - 4.0 K/uL   Monocytes Relative 10 %   Monocytes Absolute 1.0 0.1 - 1.0 K/uL   Eosinophils Relative 0 %   Eosinophils Absolute 0.0 0.0 - 0.5 K/uL   Basophils Relative 0 %   Basophils Absolute  0.0 0.0 - 0.1 K/uL   Immature Granulocytes 0 %   Abs Immature Granulocytes 0.04 0.00 - 0.07 K/uL    Comment: Performed at [facility], [address] [place], [place] [address]  Protime-INR     Status: Abnormal   Collection Time: 09/17/21  1:49 AM  Result Value Ref Range   Prothrombin Time 26.8 (H) 11.4 - 15.2 seconds   INR 2.5 (H) 0.8 - 1.2    Comment: (NOTE) INR goal varies based on device and disease states. Performed at [facility], [address] [place], [place] [address]   APTT     Status: Abnormal   Collection Time: 09/17/21  1:49 AM  Result Value Ref Range   aPTT 38 (H) 24 - 36 seconds    Comment:        IF BASELINE aPTT IS ELEVATED, SUGGEST PATIENT RISK ASSESSMENT BE USED TO  DETERMINE APPROPRIATE ANTICOAGULANT THERAPY. Performed at [facility], [address] [place], [place] [address]   Blood Culture (routine x 2)     Status: None (Preliminary result)   Collection Time: 09/17/21  1:50 AM   Specimen: Left Antecubital; Blood  Result Value Ref Range   Specimen Description      LEFT ANTECUBITAL BOTTLES DRAWN AEROBIC AND ANAEROBIC   Special Requests      Blood Culture adequate volume Performed at [facility], [address], [place], [place] [address]    Culture PENDING    Report Status PENDING   Blood Culture (routine x 2)     Status: None (Preliminary result)   Collection Time: 09/17/21  1:56 AM   Specimen: BLOOD LEFT FOREARM  Result Value Ref Range   Specimen Description      BLOOD LEFT FOREARM BOTTLES DRAWN AEROBIC AND ANAEROBIC   Special Requests      Blood Culture adequate volume Performed at [facility], [address] [place], [place] [address]    Culture PENDING    Report Status PENDING   Resp Panel by RT-PCR (Flu A&B, Covid) Nasopharyngeal Swab     Status: None   Collection Time: 09/17/21  3:18 AM   Specimen: Nasopharyngeal Swab; Nasopharyngeal(NP) swabs in vial transport medium  Result Value Ref Range   SARS Coronavirus 2 by RT PCR NEGATIVE NEGATIVE    Comment: (NOTE) SARS-CoV-2 target nucleic acids are NOT DETECTED.  The SARS-CoV-2 RNA is generally detectable in upper respiratory specimens during the acute phase of infection. The lowest concentration of SARS-CoV-2 viral copies this assay can detect is 138 copies/mL. A negative result does not preclude SARS-Cov-2 infection and should not be used as the sole basis for treatment or other patient management decisions. A negative result may occur with  improper specimen collection/handling, submission of specimen other than nasopharyngeal swab, presence of viral mutation(s) within the areas targeted by this assay, and inadequate number of viral copies(<138 copies/mL). A negative result  must be combined with clinical observations, patient history, and epidemiological information. The expected result is Negative.  Fact Sheet for Patients:  EntrepreneurPulse.com.au  Fact Sheet for Healthcare Providers:  IncredibleEmployment.be  This test is no t yet approved or cleared by the [place] FDA and  has been authorized for detection and/or diagnosis of SARS-CoV-2 by FDA under an Emergency Use Authorization (EUA). This EUA will remain  in effect (meaning this test can be used) for the duration of the COVID-19 declaration under Section 564(b)(1) of the Act, 21 U.S.C.section 360bbb-3(b)(1), unless the authorization is terminated  or revoked  sooner.       Influenza A by PCR NEGATIVE NEGATIVE   Influenza B by PCR NEGATIVE NEGATIVE    Comment: (NOTE) The Xpert Xpress SARS-CoV-2/FLU/RSV plus assay is intended as an aid in the diagnosis of influenza from Nasopharyngeal swab specimens and should not be used as a sole basis for treatment. Nasal washings and aspirates are unacceptable for Xpert Xpress SARS-CoV-2/FLU/RSV testing.  Fact Sheet for Patients: EntrepreneurPulse.com.au  Fact Sheet for Healthcare Providers: IncredibleEmployment.be  This test is not yet approved or cleared by the [place] FDA and has been authorized for detection and/or diagnosis of SARS-CoV-2 by FDA under an Emergency Use Authorization (EUA). This EUA will remain in effect (meaning this test can be used) for the duration of the COVID-19 declaration under Section 564(b)(1) of the Act, 21 U.S.C. section 360bbb-3(b)(1), unless the authorization is terminated or revoked.  Performed at [facility], [address], [place], [place] [address]   Lactic acid, plasma     Status: Abnormal   Collection Time: 09/17/21  3:27 AM  Result Value Ref Range   Lactic Acid, Venous 2.0 (HH) 0.5 - 1.9 mmol/L    Comment: CRITICAL RESULT  CALLED TO, READ BACK BY AND VERIFIED WITH: Sharyon Cable @ 0412 ON 09/17/21 BY JUW Performed at [facility], [address] [place], [place] [address]   Urinalysis, Routine w reflex microscopic Urine, In & Out Cath     Status: Abnormal   Collection Time: 09/17/21  4:20 AM  Result Value Ref Range   Color, Urine YELLOW YELLOW   APPearance CLEAR CLEAR   Specific Gravity, Urine >1.030 (H) 1.005 - 1.030   pH 6.0 5.0 - 8.0   Glucose, UA NEGATIVE NEGATIVE mg/dL   Hgb urine dipstick NEGATIVE NEGATIVE   Bilirubin Urine SMALL (A) NEGATIVE   Ketones, ur 40 (A) NEGATIVE mg/dL   Protein, ur 30 (A) NEGATIVE mg/dL   Nitrite NEGATIVE NEGATIVE   Leukocytes,Ua NEGATIVE NEGATIVE    Comment: Performed at [facility], [address] [place], [place] [address]  Urinalysis, Microscopic (reflex)     Status: Abnormal   Collection Time: 09/17/21  4:20 AM  Result Value Ref Range   RBC / HPF 6-10 0 - 5 RBC/hpf   WBC, UA 0-5 0 - 5 WBC/hpf   Bacteria, UA MANY (A) NONE SEEN   Squamous Epithelial / LPF 6-10 0 - 5    Comment: Performed at [facility], [address], [place], [place] [address]    Chemistries  Recent Labs  Lab 09/17/21 0149  NA 143  K 3.1*  CL 106  CO2 23  GLUCOSE 125*  BUN 31*  CREATININE 0.61  CALCIUM 9.3  AST 44*  ALT 43  ALKPHOS 53  BILITOT 1.1   ------------------------------------------------------------------------------------------------------------------  ------------------------------------------------------------------------------------------------------------------ GFR: Estimated Creatinine Clearance: 60.6 mL/min (by C-G formula based on SCr of 0.61 mg/dL). Liver Function Tests: Recent Labs  Lab 09/17/21 0149  AST 44*  ALT 43  ALKPHOS 53  BILITOT 1.1  PROT 6.8  ALBUMIN 2.2*   No results for input(s): LIPASE, AMYLASE in the last 168 hours. No results for input(s): AMMONIA in the last 168 hours. Coagulation Profile: Recent Labs  Lab 09/17/21 0149  INR 2.5*    Cardiac Enzymes: No results for input(s): CKTOTAL, CKMB, CKMBINDEX, TROPONINI in the last 168 hours. BNP (last 3 results) No results for input(s): PROBNP in the last 8760 hours. HbA1C: No results for input(s): HGBA1C in the last 72 hours. CBG: No results for  input(s): GLUCAP in the last 168 hours. Lipid Profile: No results for input(s): CHOL, HDL, LDLCALC, TRIG, CHOLHDL, LDLDIRECT in the last 72 hours. Thyroid Function Tests: No results for input(s): TSH, T4TOTAL, FREET4, T3FREE, THYROIDAB in the last 72 hours. Anemia Panel: No results for input(s): VITAMINB12, FOLATE, FERRITIN, TIBC, IRON, RETICCTPCT in the last 72 hours.  --------------------------------------------------------------------------------------------------------------- Urine analysis:    Component Value Date/Time   COLORURINE YELLOW 09/17/2021 0420   APPEARANCEUR CLEAR 09/17/2021 0420   LABSPEC >1.030 (H) 09/17/2021 0420   PHURINE 6.0 09/17/2021 0420   GLUCOSEU NEGATIVE 09/17/2021 0420   HGBUR NEGATIVE 09/17/2021 0420   BILIRUBINUR SMALL (A) 09/17/2021 0420   KETONESUR 40 (A) 09/17/2021 0420   PROTEINUR 30 (A) 09/17/2021 0420   UROBILINOGEN 2.0 (H) 06/05/2013 1002   NITRITE NEGATIVE 09/17/2021 0420   LEUKOCYTESUR NEGATIVE 09/17/2021 0420      Imaging Results:    DG Chest Port 1 View  Result Date: 09/17/2021 CLINICAL DATA:  Altered mental status with fever and tachycardia. EXAM: PORTABLE CHEST 1 VIEW COMPARISON:  August 18, 2021 FINDINGS: There is no evidence of acute infiltrate, pleural effusion or pneumothorax. The heart size and mediastinal contours are within normal limits. There is mild calcification of the aortic arch. The visualized skeletal structures are unremarkable. IMPRESSION: No active disease. Electronically Signed   By: Virgina Norfolk M.D.   On: 09/17/2021 02:40       Assessment & Plan:    Principal Problem:   Sepsis ([place]) Active Problems:   Hyperlipidemia   Acute metabolic  encephalopathy   Severe protein-calorie malnutrition (HCC)   Mood disorder (HCC)   Sepsis Sepsis secondary to UTI SIRS criteria tachycardic, tachypneic, febrile Most likely source of infection is urine Life threatening organ dysfunction 2/2 infection is evidenced by: Lactic acidosis 2.3 Antibiotics started vancomycin and cefepime Fluids given prior to admission 3 L bolus Sepsis order set utilized Monitor this patient on telemetry Urine culture and blood culture pending COVID and flu negative Culturing the expectorated sputum Continue to monitor Acute metabolic encephalopathy Most likely secondary to #1 CT head pending TSH pending Continue treatment for sepsis Continue to monitor Hyperlipidemia Continue statin Severe protein calorie malnutrition Between meals Mood disorder Continue Seroquel History of DVT Continue Eliquis Hypokalemia Most likely secondary to poor p.o. intake especially given the protein calorie malnutrition Replace and recheck Encourage p.o. intake   DVT Prophylaxis-   Eliquis- SCDs   AM Labs Ordered, also please review Full Orders  Family Communication: No family at bedside Code Status: Full  Admission status: Inpatient :The appropriate admission status for this patient is INPATIENT. Inpatient status is judged to be reasonable and necessary in order to provide the required intensity of service to ensure the patient's safety. The patient's presenting symptoms, physical exam findings, and initial radiographic and laboratory data in the context of their chronic comorbidities is felt to place them at high risk for further clinical deterioration. Furthermore, it is not anticipated that the patient will be medically stable for discharge from the hospital within 2 midnights of admission. The following factors support the admission status of inpatient.     The patient's presenting symptoms include altered mental status. The worrisome physical exam findings  include encephalopathic, febrile, tachypneic, tachycardic The initial radiographic and laboratory data are worrisome because of lactic acidosis. The chronic co-morbidities include hyperlipidemia, mood disorder, history of DVT.       * I certify that at the point of admission it is my clinical judgment that the  patient will require inpatient hospital care spanning beyond 2 midnights from the point of admission due to high intensity of service, high risk for further deterioration and high frequency of surveillance required.*  Disposition: Anticipated Discharge 48-72hrs Discharge to SNF  Time spent in minutes : [place] DO

## 2021-09-17 NOTE — ED Provider Notes (Signed)
[facility] Provider Note   CSN: [id number] Arrival date & time: 09/17/21  0121     History Chief Complaint  Patient presents with   Altered Mental Status    [name] is a 80 y.o. female.  80 year old female who presents emerged from today from her facility for altered mental status.  Not able to offer history secondary to this.   Altered Mental Status     Past Medical History:  Diagnosis Date   Arthritis    Depression    HTN (hypertension)    Hypercholesteremia    S/P colonoscopy 2009   adenoma, repeat 5 years    Patient Active Problem List   Diagnosis Date Noted   Bone lesion 09/06/2021   DVT (deep venous thrombosis) ([place]) 08/17/2021   Hypokalemia 08/17/2021   HTN (hypertension) 08/17/2021   Hyperlipidemia 08/17/2021   Adenomatous colon polyp 03/18/2013    Past Surgical History:  Procedure Laterality Date   CATARACT EXTRACTION W/PHACO Right 04/14/2014   Procedure: CATARACT EXTRACTION PHACO AND INTRAOCULAR LENS PLACEMENT ([place]) CDE=6.22;  Surgeon: Elta Guadeloupe T. Gershon Crane, MD;  Location: [facility];  Service: Ophthalmology;  Laterality: Right;   CATARACT EXTRACTION W/PHACO Left 06/16/2014   Procedure: CATARACT EXTRACTION PHACO AND INTRAOCULAR LENS PLACEMENT ([facility]);  Surgeon: Elta Guadeloupe T. Gershon Crane, MD;  Location: [facility];  Service: Ophthalmology;  Laterality: Left;  CDE:8.01   COLONOSCOPY  02/17/2005   EHM:CNOBSJ polyp and right colon polyp, status post snare polypectomy/Otherwise normal rectum and colon   COLONOSCOPY  02/18/2008   RMR: Normal rectum/Polyp in the hepatic flexure removed with snare cecal polyps were cold biopsied/removed; remainder of the colonic mucosa appeared normal. Tubluar adenoma   COLONOSCOPY N/A 03/26/2013   Procedure: COLONOSCOPY;  Surgeon: Daneil Dolin, MD;  Location: [facility];  Service: Endoscopy;  Laterality: N/A;  1:15   COLONOSCOPY N/A 06/05/2018   Procedure: COLONOSCOPY;  Surgeon: Daneil Dolin, MD;  Location: [facility];  Service: Endoscopy;  Laterality: N/A;  10:30   TOTAL ABDOMINAL HYSTERECTOMY     fibroid tumors   UTERINE FIBROID SURGERY       OB History     Gravida  3   Para  3   Term  3   Preterm      AB      Living  3      SAB      IAB      Ectopic      Multiple      Live Births              Family History  Problem Relation Age of Onset   Stroke Brother    Stroke Mother        deceased   Emphysema Father        deceased   Colon cancer Neg Hx     Social History   Tobacco Use   Smoking status: Never   Smokeless tobacco: Never  Vaping Use   Vaping Use: Never used  Substance Use Topics   Alcohol use: No   Drug use: No    Home Medications Prior to Admission medications   Medication Sig Start Date End Date Taking? Authorizing Provider  acetaminophen (TYLENOL) 325 MG tablet Take 650 mg by mouth every 6 (six) hours as needed for mild pain or moderate pain. Patient not taking: Reported on 09/06/2021    [provider]  apixaban (ELIQUIS) 5 MG TABS tablet Take 10mg  po bid through  11/6 then take 5mg  po bid 08/22/21   Kathie Dike, MD  cetirizine (ZYRTEC) 10 MG tablet Take 10 mg by mouth daily.    [provider]  diphenhydramine-acetaminophen (TYLENOL PM) 25-500 MG TABS tablet Take 1 tablet by mouth at bedtime as needed (for sleep). Patient not taking: Reported on 09/06/2021    [provider]  lovastatin (MEVACOR) 20 MG tablet Take 20 mg by mouth at bedtime.      [provider]  metoprolol tartrate (LOPRESSOR) 25 MG tablet Take 1 tablet (25 mg total) by mouth 2 (two) times daily. 08/22/21   Kathie Dike, MD  ondansetron (ZOFRAN) 4 MG tablet Take 1 tablet (4 mg total) by mouth every 6 (six) hours. 10/26/18   Nat Christen, MD  QUEtiapine (SEROQUEL) 25 MG tablet Take 1 tablet (25 mg total) by mouth at bedtime. 08/22/21   Kathie Dike, MD  Vitamin D, Ergocalciferol, (DRISDOL) 1.25 MG (50000 UNIT) CAPS capsule Take 50,000  Units by mouth once a week. 07/01/21   [provider]    Allergies    Patient has no known allergies.  Review of Systems   Review of Systems  Unable to perform ROS: Mental status change   Physical Exam Updated Vital Signs BP 104/84   Pulse (!) 115   Temp 99.6 F (37.6 C) (Rectal)   Resp 14   Ht 5\' 9"  (1.753 m)   Wt 76.4 kg Comment: bed scale  SpO2 95%   BMI 24.87 kg/m   Physical Exam Vitals and nursing note reviewed.  Constitutional:      Appearance: She is well-developed.  HENT:     Head: Normocephalic and atraumatic.     Mouth/Throat:     Mouth: Mucous membranes are dry.     Pharynx: Oropharynx is clear.  Eyes:     Conjunctiva/sclera: Conjunctivae normal.     Pupils: Pupils are equal, round, and reactive to light.  Cardiovascular:     Rate and Rhythm: Normal rate and regular rhythm.  Pulmonary:     Effort: No respiratory distress.     Breath sounds: No stridor.  Abdominal:     General: Abdomen is flat. There is no distension.  Musculoskeletal:        General: No swelling. Normal range of motion.     Cervical back: Normal range of motion.  Skin:    General: Skin is warm and dry.  Neurological:     Mental Status: She is alert. She is disoriented.     Comments: Patient keeps asking for Bethena Roys.  She knows her name is [name].  She is moving all of her extremities.    ED Results / Procedures / Treatments   Labs (all labs ordered are listed, but only abnormal results are displayed) Labs Reviewed  COMPREHENSIVE METABOLIC PANEL - Abnormal; Notable for the following components:      Result Value   Potassium 3.1 (*)    Glucose, Bld 125 (*)    BUN 31 (*)    Albumin 2.2 (*)    AST 44 (*)    All other components within normal limits  LACTIC ACID, PLASMA - Abnormal; Notable for the following components:   Lactic Acid, Venous 2.3 (*)    All other components within normal limits  LACTIC ACID, PLASMA - Abnormal; Notable for the following components:   Lactic  Acid, Venous 2.0 (*)    All other components within normal limits  CBC WITH DIFFERENTIAL/PLATELET - Abnormal; Notable for the following  components:   Hemoglobin 11.3 (*)    HCT 35.9 (*)    Platelets 491 (*)    All other components within normal limits  PROTIME-INR - Abnormal; Notable for the following components:   Prothrombin Time 26.8 (*)    INR 2.5 (*)    All other components within normal limits  APTT - Abnormal; Notable for the following components:   aPTT 38 (*)    All other components within normal limits  URINALYSIS, ROUTINE W REFLEX MICROSCOPIC - Abnormal; Notable for the following components:   Specific Gravity, Urine >1.030 (*)    Bilirubin Urine SMALL (*)    Ketones, ur 40 (*)    Protein, ur 30 (*)    All other components within normal limits  URINALYSIS, MICROSCOPIC (REFLEX) - Abnormal; Notable for the following components:   Bacteria, UA MANY (*)    All other components within normal limits  CULTURE, BLOOD (ROUTINE X 2)  CULTURE, BLOOD (ROUTINE X 2)  RESP PANEL BY RT-PCR (FLU A&B, COVID) ARPGX2  URINE CULTURE  CBG MONITORING, ED    EKG None  Radiology DG Chest Port 1 View  Result Date: 09/17/2021 CLINICAL DATA:  Altered mental status with fever and tachycardia. EXAM: PORTABLE CHEST 1 VIEW COMPARISON:  August 18, 2021 FINDINGS: There is no evidence of acute infiltrate, pleural effusion or pneumothorax. The heart size and mediastinal contours are within normal limits. There is mild calcification of the aortic arch. The visualized skeletal structures are unremarkable. IMPRESSION: No active disease. Electronically Signed   By: Virgina Norfolk M.D.   On: 09/17/2021 02:40    Procedures .Critical Care Performed by: Merrily Pew, MD Authorized by: Merrily Pew, MD   Critical care provider statement:    Critical care time (minutes):  32   Critical care time was exclusive of:  Separately billable procedures and treating other patients and teaching time    Critical care was necessary to treat or prevent imminent or life-threatening deterioration of the following conditions:  Sepsis   Critical care was time spent personally by me on the following activities:  Development of treatment plan with patient or surrogate, evaluation of patient's response to treatment, examination of patient, obtaining history from patient or surrogate, review of old charts, re-evaluation of patient's condition, pulse oximetry, ordering and performing treatments and interventions and ordering and review of laboratory studies   Medications Ordered in ED Medications  ziprasidone (GEODON) injection 10 mg (has no administration in time range)  vancomycin (VANCOREADY) IVPB 1500 mg/300 mL (1,500 mg Intravenous New Bag/Given 09/17/21 0317)  lactated ringers bolus 1,000 mL (has no administration in time range)  lactated ringers bolus 1,000 mL (1,000 mLs Intravenous New Bag/Given 09/17/21 0222)  acetaminophen (TYLENOL) tablet 650 mg (650 mg Oral Given 09/17/21 0214)  lactated ringers bolus 1,000 mL (0 mLs Intravenous Stopped 09/17/21 0411)  LORazepam (ATIVAN) injection 2 mg (2 mg Intravenous Given 09/17/21 0241)  ceFEPIme (MAXIPIME) 2 g in sodium chloride 0.9 % 100 mL IVPB (0 g Intravenous Stopped 09/17/21 0411)    ED Course  I have reviewed the triage vital signs and the nursing notes.  Pertinent labs & imaging results that were available during my care of the patient were reviewed by me and considered in my medical decision making (see chart for details).    MDM Rules/Calculators/A&P                         Patient with what appears to  possibly be urinary tract infection.  She lactic acid is improving.  She had fluids and broad-spectrum antibiotics to cover for sepsis.  Blood cultures have already been drawn urine culture with the urine.  Heart rates improving.  Mental status is improving with fluids.  She is significant dehydrated on exam so she [place] more than 30 cc/kg before  heart rate really starts to improve.  Fevers already improved. Will d/w hospitalist.   Final Clinical Impression(s) / ED Diagnoses Final diagnoses:  Altered mental status, unspecified altered mental status type  Urinary tract infection without hematuria, site unspecified    Rx / DC Orders ED Discharge Orders     None        Mutasim Tuckey, Corene Cornea, MD 09/17/21 0505

## 2021-09-17 NOTE — Progress Notes (Signed)
Son, Izell Henning, given update via phone.

## 2021-09-17 NOTE — Progress Notes (Signed)
PT Cancellation Note  Patient Details Name: [name] MRN: [id number] DOB: Aug 29, 1941   Cancelled Treatment:    Reason Eval/Treat Not Completed: Patient's level of consciousness;Fatigue/lethargy limiting ability to participate; Unable to complete physical therapy evaluation at this time due to patient's level of consciousness limiting participation. Will check back tomorrow.  12:04 PM, 09/17/21 Mearl Latin PT, DPT Physical Therapist at [facility]

## 2021-09-17 NOTE — Progress Notes (Signed)
Patient seen and examined.  Admitted early morning hours by nighttime hospitalist.  See H&P assessment plan for details.  I also called her daughter to get more information and additional history as below.  Patient is currently residing at [facility] in [place] and showing progressive behavior issues including not eating, splitting meals, sundowning and failure to recognizing family members at least for last 2 months.  She was noted to have more aggressive behavior and agitation for last few days so brought to the ER.  Family also noted she is not eating well. Today in the emergency room she is tachycardic and febrile with temperature 101.  Sepsis protocol was started and treated with vancomycin and cefepime.  Suspect UTI that is worsening her delirium due to underlying dementia.  Plan: Continue to treat as sepsis with broad-spectrum antibiotics pending final blood cultures and urine cultures. Will increase dose of Seroquel at night to achieve some symptom control with aim to go back to nursing home once medically stable.  May need more medication adjustment.  I discussed this with patient's daughter about possible need for increasing doses of atypical antipsychotics to control her symptoms and family is agreeable to use medications.  Total time spent: 25 minutes.  No charge visit.  Seen by provider early morning today.

## 2021-09-17 NOTE — Progress Notes (Signed)
Patient c/o sever pain to left hip, unable to tell this nurse if recent fall has occurred. Oxycodone given per order, Dr. Sloan Leiter made aware.

## 2021-09-17 NOTE — Progress Notes (Signed)
   09/17/21 0800  Assess: MEWS Score  Temp 98.1 F (36.7 C)  BP 134/84  Pulse Rate (!) 112  Resp 20  Level of Consciousness Alert  SpO2 98 %  O2 Device Room Air  Assess: MEWS Score  MEWS Temp 0  MEWS Systolic 0  MEWS Pulse 2  MEWS RR 0  MEWS LOC 0  MEWS Score 2  MEWS Score Color Yellow  Assess: if the MEWS score is Yellow or Red  Were vital signs taken at a resting state? Yes  Focused Assessment No change from prior assessment  Early Detection of Sepsis Score *See Row Information* Low  MEWS guidelines implemented *See Row Information* Yes  Take Vital Signs  Increase Vital Sign Frequency  Yellow: Q 2hr X 2 then Q 4hr X 2, if remains yellow, continue Q 4hrs  Escalate  MEWS: Escalate Yellow: discuss with charge nurse/RN and consider discussing with provider and RRT  Notify: Charge Nurse/RN  Name of Charge Nurse/RN Notified Zenaida Deed RN  Date Charge Nurse/RN Notified 09/17/21  Time Charge Nurse/RN Notified 1610  Notify: Provider  Provider Name/Title Dr. Sloan Leiter  Date Provider Notified 09/17/21  Time Provider Notified [phone]  Notification Type Page  Notification Reason Other (Comment) (yellow MEWS)  Provider response No new orders  Date of Provider Response 09/17/21  Time of Provider Response [phone]

## 2021-09-17 NOTE — Progress Notes (Signed)
Pharmacy Antibiotic Note  [name] is a 80 y.o. female admitted on 09/17/2021 with sepsis.  Pharmacy has been consulted for vancomycin and cefepime dosing.  Plan: Vancomycin 1500mg  IV Q12H. Goal AUC 400-550.  Expected AUC 500.  SCr used 0.8 (current 0.61).  Cefepime 2g IV Q12H.  Height: 5\' 9"  (175.3 cm) Weight: 76.4 kg (168 lb 6.9 oz) (bed scale) IBW/kg (Calculated) : 66.2  Temp (24hrs), Avg:99.5 F (37.5 C), Min:97.6 F (36.4 C), Max:101.3 F (38.5 C)  Recent Labs  Lab 09/17/21 0149 09/17/21 0327  WBC 9.9  --   CREATININE 0.61  --   LATICACIDVEN 2.3* 2.0*    Estimated Creatinine Clearance: 60.6 mL/min (by C-G formula based on SCr of 0.61 mg/dL).    No Known Allergies   Thank you for allowing pharmacy to be a part of this patient's care.  Wynona Neat, PharmD, BCPS  09/17/2021 6:09 AM

## 2021-09-17 NOTE — Progress Notes (Signed)
Daughter, Jonelle Sidle, at bedside. Update given and all questions answered to her satisfaction. [place] in room performing doppler.

## 2021-09-18 DIAGNOSIS — G9340 Encephalopathy, unspecified: Secondary | ICD-10-CM | POA: Diagnosis not present

## 2021-09-18 DIAGNOSIS — R6520 Severe sepsis without septic shock: Secondary | ICD-10-CM | POA: Diagnosis not present

## 2021-09-18 DIAGNOSIS — A419 Sepsis, unspecified organism: Secondary | ICD-10-CM | POA: Diagnosis not present

## 2021-09-18 LAB — CBC WITH DIFFERENTIAL/PLATELET
Abs Immature Granulocytes: 0.05 10*3/uL (ref 0.00–0.07)
Basophils Absolute: 0 10*3/uL (ref 0.0–0.1)
Basophils Relative: 0 %
Eosinophils Absolute: 0.1 10*3/uL (ref 0.0–0.5)
Eosinophils Relative: 1 %
HCT: 32.1 % — ABNORMAL LOW (ref 36.0–46.0)
Hemoglobin: 9.7 g/dL — ABNORMAL LOW (ref 12.0–15.0)
Immature Granulocytes: 1 %
Lymphocytes Relative: 16 %
Lymphs Abs: 1.2 10*3/uL (ref 0.7–4.0)
MCH: 26.6 pg (ref 26.0–34.0)
MCHC: 30.2 g/dL (ref 30.0–36.0)
MCV: 88.2 fL (ref 80.0–100.0)
Monocytes Absolute: 0.8 10*3/uL (ref 0.1–1.0)
Monocytes Relative: 11 %
Neutro Abs: 5.3 10*3/uL (ref 1.7–7.7)
Neutrophils Relative %: 71 %
Platelets: 392 10*3/uL (ref 150–400)
RBC: 3.64 MIL/uL — ABNORMAL LOW (ref 3.87–5.11)
RDW: 13.5 % (ref 11.5–15.5)
WBC: 7.4 10*3/uL (ref 4.0–10.5)
nRBC: 0 % (ref 0.0–0.2)

## 2021-09-18 LAB — COMPREHENSIVE METABOLIC PANEL
ALT: 36 U/L (ref 0–44)
AST: 32 U/L (ref 15–41)
Albumin: 1.7 g/dL — ABNORMAL LOW (ref 3.5–5.0)
Alkaline Phosphatase: 41 U/L (ref 38–126)
Anion gap: 7 (ref 5–15)
BUN: 20 mg/dL (ref 8–23)
CO2: 24 mmol/L (ref 22–32)
Calcium: 8.6 mg/dL — ABNORMAL LOW (ref 8.9–10.3)
Chloride: 113 mmol/L — ABNORMAL HIGH (ref 98–111)
Creatinine, Ser: 0.52 mg/dL (ref 0.44–1.00)
GFR, Estimated: >60 mL/min (ref >60)
Glucose, Bld: 102 mg/dL — ABNORMAL HIGH (ref 70–99)
Potassium: 3.4 mmol/L — ABNORMAL LOW (ref 3.5–5.1)
Sodium: 144 mmol/L (ref 135–145)
Total Bilirubin: 0.7 mg/dL (ref 0.3–1.2)
Total Protein: 5.4 g/dL — ABNORMAL LOW (ref 6.5–8.1)

## 2021-09-18 LAB — URINE CULTURE: Culture: NO GROWTH

## 2021-09-18 LAB — PROTIME-INR
INR: 2.2 — ABNORMAL HIGH (ref 0.8–1.2)
Prothrombin Time: 24.7 seconds — ABNORMAL HIGH (ref 11.4–15.2)

## 2021-09-18 LAB — MAGNESIUM: Magnesium: 1.7 mg/dL (ref 1.7–2.4)

## 2021-09-18 LAB — TSH: TSH: 0.711 u[IU]/mL (ref 0.350–4.500)

## 2021-09-18 LAB — CORTISOL-AM, BLOOD: Cortisol - AM: 17.6 ug/dL (ref 6.7–22.6)

## 2021-09-18 MED ORDER — POTASSIUM CHLORIDE CRYS ER 20 MEQ PO TBCR
20.0000 meq | EXTENDED_RELEASE_TABLET | Freq: Two times a day (BID) | ORAL | Status: AC
  Administered 2021-09-18 – 2021-09-19 (×4): 20 meq via ORAL
  Filled 2021-09-18 (×4): qty 1

## 2021-09-18 MED ORDER — HALOPERIDOL LACTATE 5 MG/ML IJ SOLN
2.0000 mg | Freq: Four times a day (QID) | INTRAMUSCULAR | Status: DC | PRN
  Administered 2021-09-18 – 2021-09-20 (×3): 2 mg via INTRAVENOUS
  Filled 2021-09-18 (×4): qty 1

## 2021-09-18 MED ORDER — QUETIAPINE FUMARATE 25 MG PO TABS
50.0000 mg | ORAL_TABLET | Freq: Two times a day (BID) | ORAL | Status: DC
  Administered 2021-09-18 – 2021-09-22 (×8): 50 mg via ORAL
  Filled 2021-09-18 (×8): qty 2

## 2021-09-18 MED ORDER — HALOPERIDOL 5 MG PO TABS: 5.0000 mg | ORAL_TABLET | Freq: Four times a day (QID) | ORAL | Status: DC | PRN

## 2021-09-18 NOTE — Evaluation (Signed)
Physical Therapy Evaluation Patient Details Name: [name] MRN: [id number] DOB: November 27, 1940 Today's Date: 09/18/2021  History of Present Illness  [name]  is a 80 y.o. female, with history of hypertension, hypercholesterolemia, depression, mood disorder, and more presents to ED with chief complaint of altered mental status.  Patient is responding to commands, and will answer some questions between episodes of falling asleep.  She is somnolent after Ativan.  She does not know why she is here in the ER.  When asked where she is she says she is in a group home.  She is oriented to herself but not to time or place.  Patient is not able to provide any history.  I attempted to reach staff at the [facility] without an answer.  When patient arrived in the ED she was tachycardic, tachypneic, febrile.  Sepsis protocol was started she was given a 3 L bolus.  She was started on vancomycin and cefepime.  Her chest x-ray shows no active disease.  Urine analysis is borderline.  Urine culture and blood cultures pending.  She has no leukocytosis but she does have a thrombocytosis of 491.  Her chemistry panel reveals a hypokalemia and a protein calorie malnutrition.  Her lactic acid was initially 2.3, was 2.0 on repeat but this was before the fluid bolus was finished.  She an EKG that showed sinus tachycardia with a heart rate of 131, QTc 454 no acute ischemic changes.  She was given Tylenol for fever, Ativan for agitation.  Geodon has been ordered but has not been used as of yet.  Admission was requested for work-up of sepsis.  Most likely source at this point is urine, but awaiting cultures.   Clinical Impression  Patient limited for functional mobility as stated below secondary to BLE weakness, fatigue, pain, and impaired balance. Patient requiring assist for mobility secondary to c/o generalized pain and overall weakness. Patient will benefit from continued physical therapy in hospital and recommended  venue below to increase strength, balance, endurance for safe ADLs and gait.        Recommendations for follow up therapy are one component of a multi-disciplinary discharge planning process, led by the attending physician.  Recommendations may be updated based on patient status, additional functional criteria and insurance authorization.  Follow Up Recommendations Skilled nursing-short term rehab (<3 hours/day)    Assistance Recommended at Discharge Intermittent Supervision/Assistance  Functional Status Assessment Patient has had a recent decline in their functional status and demonstrates the ability to make significant improvements in function in a reasonable and predictable amount of time.  Equipment Recommendations  None recommended by PT    Recommendations for Other Services       Precautions / Restrictions Precautions Precautions: Fall Restrictions Weight Bearing Restrictions: No      Mobility  Bed Mobility Overal bed mobility: Needs Assistance Bed Mobility: Supine to Sit     Supine to sit: Max assist     General bed mobility comments: Patient with limited bed mobility as she c/o genereralized pain    Transfers                        Ambulation/Gait                  Stairs            Wheelchair Mobility    Modified Rankin (Stroke Patients Only)       Balance  Pertinent Vitals/Pain Pain Assessment: No/denies pain    Home Living                     Additional Comments: Patient is a questionable historian stating she lives alone in an apartment, ambulates without AD, and indepndent with ADL; Per chart she is coming from SNF    Prior Function Prior Level of Function : Independent/Modified Independent             Mobility Comments: household ambulator without AD, per patient ADLs Comments: assisted by family     Hand Dominance         Extremity/Trunk Assessment   Upper Extremity Assessment Upper Extremity Assessment: Defer to OT evaluation    Lower Extremity Assessment Lower Extremity Assessment: Generalized weakness       Communication   Communication: No difficulties  Cognition Arousal/Alertness: Awake/alert Behavior During Therapy: WFL for tasks assessed/performed Overall Cognitive Status: Within Functional Limits for tasks assessed                                          General Comments      Exercises     Assessment/Plan    PT Assessment Patient needs continued PT services  PT Problem List Decreased strength;Decreased mobility;Decreased activity tolerance;Decreased balance       PT Treatment Interventions DME instruction;Therapeutic exercise;Gait training;Balance training;Stair training;Neuromuscular re-education;Functional mobility training;Therapeutic activities;Patient/family education    PT Goals (Current goals can be found in the Care Plan section)  Acute Rehab PT Goals Patient Stated Goal: decrease pain PT Goal Formulation: With patient Time For Goal Achievement: 10/02/21 Potential to Achieve Goals: Fair    Frequency Min 3X/week   Barriers to discharge        Co-evaluation               AM-PAC PT "6 Clicks" Mobility  Outcome Measure Help needed turning from your back to your side while in a flat bed without using bedrails?: A Lot Help needed moving from lying on your back to sitting on the side of a flat bed without using bedrails?: A Lot Help needed moving to and from a bed to a chair (including a wheelchair)?: A Lot Help needed standing up from a chair using your arms (e.g., wheelchair or bedside chair)?: A Lot Help needed to walk in hospital room?: A Lot Help needed climbing 3-5 steps with a railing? : Total 6 Click Score: 11    End of Session   Activity Tolerance: Patient limited by pain Patient left: in bed;with call bell/phone within reach;with  bed alarm set Nurse Communication: Mobility status PT Visit Diagnosis: Other abnormalities of gait and mobility (R26.89);Muscle weakness (generalized) (M62.81)    Time: 0017-4944 PT Time Calculation (min) (ACUTE ONLY): 7 min   Charges:   PT Evaluation $PT Eval Low Complexity: 1 Low         10:06 AM, 09/18/21 Mearl Latin PT, DPT Physical Therapist at [facility]

## 2021-09-18 NOTE — Progress Notes (Signed)
   09/18/21 0842  Assess: MEWS Score  Temp 98.2 F (36.8 C)  BP 126/81  Pulse Rate (!) 115  Resp 18  SpO2 100 %  O2 Device Room Air  Assess: MEWS Score  MEWS Temp 0  MEWS Systolic 0  MEWS Pulse 2  MEWS RR 0  MEWS LOC 0  MEWS Score 2  MEWS Score Color Yellow  Assess: if the MEWS score is Yellow or Red  Were vital signs taken at a resting state? Yes  Focused Assessment Change from prior assessment (see assessment flowsheet)  Early Detection of Sepsis Score *See Row Information* Low  MEWS guidelines implemented *See Row Information* Yes  Treat  Pain Scale PAINAD  Pain Score 6  Pain Type Acute pain  Pain Location Head  Pain Descriptors / Indicators Aching  Take Vital Signs  Increase Vital Sign Frequency  Yellow: Q 2hr X 2 then Q 4hr X 2, if remains yellow, continue Q 4hrs  Escalate  MEWS: Escalate Yellow: discuss with charge nurse/RN and consider discussing with provider and RRT  Notify: Charge Nurse/RN  Name of Charge Nurse/RN Notified Audrea Muscat [facility]  Date Charge Nurse/RN Notified 09/18/21  Time Charge Nurse/RN Notified 0850  Document  Patient Outcome Other (Comment) (gave scheduled metoprolol)  Progress note created (see row info) Yes

## 2021-09-18 NOTE — Progress Notes (Signed)
   09/18/21 1838  Assess: MEWS Score  Temp 98.4 F (36.9 C)  BP (!) 121/95  Pulse Rate (!) 125  Resp 16  SpO2 99 %  O2 Device Room Air  Assess: MEWS Score  MEWS Temp 0  MEWS Systolic 0  MEWS Pulse 2  MEWS RR 0  MEWS LOC 0  MEWS Score 2  MEWS Score Color Yellow  Assess: if the MEWS score is Yellow or Red  Were vital signs taken at a resting state? Yes (restless, agitated)  Focused Assessment No change from prior assessment  Early Detection of Sepsis Score *See Row Information* Low  MEWS guidelines implemented *See Row Information* Yes  Take Vital Signs  Increase Vital Sign Frequency  Yellow: Q 2hr X 2 then Q 4hr X 2, if remains yellow, continue Q 4hrs  Escalate  MEWS: Escalate Yellow: discuss with charge nurse/RN and consider discussing with provider and RRT  Notify: Charge Nurse/RN  Name of Charge Nurse/RN Notified Juanell Fairly, RN  Date Charge Nurse/RN Notified 09/18/21  Time Charge Nurse/RN Notified [place]  Notify: Provider  Provider Name/Title Dr. Sloan Leiter  Date Provider Notified 09/18/21  Time Provider Notified [place]  Notification Type Page  Notification Reason Change in status  Provider response See new orders (increase metoprol and seroquel)  Date of Provider Response 09/18/21  Time of Provider Response 1840  Document  Patient Outcome Other (Comment) (continues tachy but new orders not implemented until tonights doses)  Progress note created (see row info) Yes

## 2021-09-18 NOTE — Plan of Care (Signed)
  Problem: Acute Rehab PT Goals(only PT should resolve) Goal: Pt Will Go Supine/Side To Sit Outcome: Progressing Flowsheets (Taken 09/18/2021 1007) Pt will go Supine/Side to Sit: with moderate assist Goal: Pt Will Go Sit To Supine/Side Outcome: Progressing Flowsheets (Taken 09/18/2021 1007) Pt will go Sit to Supine/Side: with moderate assist Goal: Patient Will Transfer Sit To/From Stand Outcome: Progressing Flowsheets (Taken 09/18/2021 1007) Patient will transfer sit to/from stand: with moderate assist Goal: Pt Will Transfer Bed To Chair/Chair To Bed Outcome: Progressing Flowsheets (Taken 09/18/2021 1007) Pt will Transfer Bed to Chair/Chair to Bed: with mod assist  10:07 AM, 09/18/21 Mearl Latin PT, DPT Physical Therapist at [facility]

## 2021-09-18 NOTE — Progress Notes (Signed)
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  Unable to give history.  In the emergency room she was tachycardic and tachypneic with temperature of 101.  Chest x-ray was normal.  Urine analysis with abnormal findings.  Cultures were drawn and patient was treated as sepsis protocol and admitted to the hospital.   Assessment & Plan:   Principal Problem:   Sepsis ([place]) Active Problems:   Hypokalemia   Hyperlipidemia   Acute metabolic encephalopathy   Severe protein-calorie malnutrition (HCC)   Mood disorder (HCC)   Pressure injury of skin  Sepsis present on admission secondary to UTI: Urine cultures and blood cultures are pending.  Some clinical improvement today.  We will continue broad-spectrum antibiotics until culture reports are finalized.  Acute metabolic encephalopathy/agitation and disorientation in a patient with dementia: CT head negative.  Infection work-up as above.  Patient is with recently worsening behavioral issues due to underlying dementia. All-time fall precautions.  Delirium precautions. Continue Seroquel 25 mg twice a day. Will start patient on as needed haloperidol to help with her symptom control.  Will prescribe long-term if helpful.  History of DVT: Patient is on Eliquis.  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  Not a candidate for intervention or clot retrieval.  Will treat with anticoagulation.  Severe protein calorie malnutrition: Attempt improving nutrition.  Continue maintenance IV fluid today.  Nutrition to see.  If continues to have poor appetite, will need goal of care discussion.  DVT prophylaxis: SCDs Start:  09/17/21 0655 apixaban (ELIQUIS) tablet 5 mg   Code Status: Full code Family Communication: Daughter Ms. Jonelle Sidle on the phone. Disposition Plan: Status is: Inpatient  Remains inpatient appropriate because: Altered mental status.  IV antibiotics.  No meaningful oral intake.         Consultants:  None  Procedures:  None  Antimicrobials:  Vancomycin and cefepime 11/25----   Subjective: Patient seen and examined.  Early morning she was well calm and composed.  She was able to answer few questions about her name and her family members.  Later in the morning, she was agitated and shouting.  Objective: Vitals:   09/17/21 2105 09/17/21 2346 09/18/21 0301 09/18/21 0842  BP: 125/73 108/69 (!) 192/181 126/81  Pulse: (!) 117 (!) 103 95 (!) 115  Resp: 16 16 17 18   Temp: 99.3 F (37.4 C)  98.5 F (36.9 C) 98.2 F (36.8 C)  TempSrc: Oral  Oral Oral  SpO2: 100% 100%  100%  Weight:      Height:        Intake/Output Summary (Last 24 hours) at 09/18/2021 1136 Last data filed at 09/18/2021 0829 Gross per 24 hour  Intake 1866.82 ml  Output 300 ml  Net 1566.82 ml   Filed Weights   09/17/21 0129 09/17/21 0134 09/17/21 0649  Weight: 84 kg 76.4 kg 74.2 kg    Examination:  General exam: Appears calm and comfortable on my interview. Keeps eyes closed.  Answers few basic questions.  Alert and awake but not oriented. Reported episodes of agitation and disorientation. Respiratory system: Clear to auscultation. Respiratory effort normal. Cardiovascular system: S1 & S2 heard, RRR. No JVD, murmurs, rubs, gallops or clicks.  Gastrointestinal system:  Abdomen is nondistended, soft and nontender. No organomegaly or masses felt. Normal bowel sounds heard. Extremities: Symmetric 5 x 5 power.  Generalized weakness. Left leg is edematous, chronic venous stasis changes.  Distal pulses are present.  Slightly painful on mobility.     Data Reviewed: I have personally reviewed following labs  and imaging studies  CBC: Recent Labs  Lab 09/17/21 0149 09/18/21 0440  WBC 9.9 7.4  NEUTROABS 7.6 5.3  HGB 11.3* 9.7*  HCT 35.9* 32.1*  MCV 86.3 88.2  PLT 491* 081   Basic Metabolic Panel: Recent Labs  Lab 09/17/21 0149 09/18/21 0440  NA 143 144  K 3.1* 3.4*  CL 106 113*  CO2 23 24  GLUCOSE 125* 102*  BUN 31* 20  CREATININE 0.61 0.52  CALCIUM 9.3 8.6*  MG  --  1.7   GFR: Estimated Creatinine Clearance: 60.6 mL/min (by C-G formula based on SCr of 0.52 mg/dL). Liver Function Tests: Recent Labs  Lab 09/17/21 0149 09/18/21 0440  AST 44* 32  ALT 43 36  ALKPHOS 53 41  BILITOT 1.1 0.7  PROT 6.8 5.4*  ALBUMIN 2.2* 1.7*   No results for input(s): LIPASE, AMYLASE in the last 168 hours. No results for input(s): AMMONIA in the last 168 hours. Coagulation Profile: Recent Labs  Lab 09/17/21 0149 09/18/21 0440  INR 2.5* 2.2*   Cardiac Enzymes: No results for input(s): CKTOTAL, CKMB, CKMBINDEX, TROPONINI in the last 168 hours. BNP (last 3 results) No results for input(s): PROBNP in the last 8760 hours. HbA1C: No results for input(s): HGBA1C in the last 72 hours. CBG: No results for input(s): GLUCAP in the last 168 hours. Lipid Profile: No results for input(s): CHOL, HDL, LDLCALC, TRIG, CHOLHDL, LDLDIRECT in the last 72 hours. Thyroid Function Tests: Recent Labs    09/18/21 0440  TSH 0.711   Anemia Panel: No results for input(s): VITAMINB12, FOLATE, FERRITIN, TIBC, IRON, RETICCTPCT in the last 72 hours. Sepsis Labs: Recent Labs  Lab 09/17/21 0149 09/17/21 0327 09/17/21 0737  PROCALCITON  --   --  <0.10  LATICACIDVEN 2.3* 2.0*  --     Recent Results (from the past 240 hour(s))  Blood Culture (routine x 2)     Status: None (Preliminary result)   Collection Time: 09/17/21  1:50 AM   Specimen: Left Antecubital; Blood  Result Value Ref Range Status   Specimen Description   Final    LEFT ANTECUBITAL BOTTLES DRAWN AEROBIC AND ANAEROBIC   Special  Requests Blood Culture adequate volume  Final   Culture   Final    NO GROWTH < 12 HOURS Performed at [facility], [address], [place], [place] [address]    Report Status PENDING  Incomplete  Blood Culture (routine x 2)     Status: None (Preliminary result)   Collection Time: 09/17/21  1:56 AM   Specimen: BLOOD LEFT FOREARM  Result Value Ref Range Status   Specimen Description   Final    BLOOD LEFT FOREARM BOTTLES DRAWN AEROBIC AND ANAEROBIC   Special Requests Blood Culture adequate volume  Final   Culture   Final    NO GROWTH < 12 HOURS Performed at [facility], [address] [place],  [address]    Report Status PENDING  Incomplete  Resp Panel by RT-PCR (Flu A&B, Covid) Nasopharyngeal Swab     Status: None   Collection Time: 09/17/21  3:18 AM   Specimen: Nasopharyngeal Swab; Nasopharyngeal(NP) swabs in vial transport medium  Result Value  Ref Range Status   SARS Coronavirus 2 by RT PCR NEGATIVE NEGATIVE Final    Comment: (NOTE) SARS-CoV-2 target nucleic acids are NOT DETECTED.  The SARS-CoV-2 RNA is generally detectable in upper respiratory specimens during the acute phase of infection. The lowest concentration of SARS-CoV-2 viral copies this assay can detect is 138 copies/mL. A negative result does not preclude SARS-Cov-2 infection and should not be used as the sole basis for treatment or other patient management decisions. A negative result may occur with  improper specimen collection/handling, submission of specimen other than nasopharyngeal swab, presence of viral mutation(s) within the areas targeted by this assay, and inadequate number of viral copies(<138 copies/mL). A negative result must be combined with clinical observations, patient history, and epidemiological information. The expected result is Negative.  Fact Sheet for Patients:  EntrepreneurPulse.com.au  Fact Sheet for Healthcare Providers:   IncredibleEmployment.be  This test is no t yet approved or cleared by the [place] FDA and  has been authorized for detection and/or diagnosis of SARS-CoV-2 by FDA under an Emergency Use Authorization (EUA). This EUA will remain  in effect (meaning this test can be used) for the duration of the COVID-19 declaration under Section 564(b)(1) of the Act, 21 U.S.C.section 360bbb-3(b)(1), unless the authorization is terminated  or revoked sooner.       Influenza A by PCR NEGATIVE NEGATIVE Final   Influenza B by PCR NEGATIVE NEGATIVE Final    Comment: (NOTE) The Xpert Xpress SARS-CoV-2/FLU/RSV plus assay is intended as an aid in the diagnosis of influenza from Nasopharyngeal swab specimens and should not be used as a sole basis for treatment. Nasal washings and aspirates are unacceptable for Xpert Xpress SARS-CoV-2/FLU/RSV testing.  Fact Sheet for Patients: EntrepreneurPulse.com.au  Fact Sheet for Healthcare Providers: IncredibleEmployment.be  This test is not yet approved or cleared by the [place] FDA and has been authorized for detection and/or diagnosis of SARS-CoV-2 by FDA under an Emergency Use Authorization (EUA). This EUA will remain in effect (meaning this test can be used) for the duration of the COVID-19 declaration under Section 564(b)(1) of the Act, 21 U.S.C. section 360bbb-3(b)(1), unless the authorization is terminated or revoked.  Performed at [facility], [address] [place], [place] [address]   Urine Culture     Status: None   Collection Time: 09/17/21  4:20 AM   Specimen: In/Out Cath Urine  Result Value Ref Range Status   Specimen Description   Final    IN/OUT CATH URINE Performed at [facility], [address], [place], [place] [address]    Special Requests   Final    NONE Performed at [facility], [address], [place], [place] [address]    Culture   Final    NO GROWTH Performed at  [facility], [place] [address] [place], [place] [address]    Report Status 09/18/2021 FINAL  Final  MRSA Next Gen by PCR, Nasal     Status: Abnormal   Collection Time: 09/17/21 11:19 AM   Specimen: Nasal Mucosa; Nasal Swab  Result Value Ref Range Status   MRSA by PCR Next Gen DETECTED (A) NOT DETECTED Final    Comment: RESULT CALLED TO, READ BACK BY AND VERIFIED WITH: PATRICIA RN @ 1302 ON [id number] BY HENDERSON L (NOTE) The GeneXpert MRSA Assay (FDA approved for NASAL specimens only), is one component of a comprehensive MRSA colonization surveillance program. It is not intended to diagnose MRSA infection nor to guide or monitor treatment for  MRSA infections. Test performance is not FDA approved in patients less than 63 years old. Performed at [facility], [address], [place], [place] [address]          Radiology Studies: CT HEAD WO CONTRAST (5MM)  Result Date: 09/17/2021 CLINICAL DATA:  Mental status change, unknown cause. EXAM: CT HEAD WITHOUT CONTRAST TECHNIQUE: Contiguous axial images were obtained from the base of the skull through the vertex without intravenous contrast. COMPARISON:  Head CT 10/27/2016. FINDINGS: Brain: The examination is mild to moderately motion degraded, limiting evaluation. Cerebral volume is normal for age. Mild patchy and ill-defined hypoattenuation within the cerebral white matter, nonspecific but compatible with chronic small vessel ischemic disease. Within limitations of motion degradation, no acute intracranial hemorrhage, demarcated cortical infarct extra-axial fluid collection or intracranial mass is identified. No midline shift. Vascular: No hyperdense vessel.  Atherosclerotic calcifications Skull: Normal. Negative for fracture or focal lesion. Sinuses/Orbits: Visualized orbits show no acute finding. Trace scattered paranasal sinus mucosal thickening. IMPRESSION: Motion degraded examination, limiting evaluation. No appreciable acute intracranial  abnormality. Minimal chronic small-vessel ischemic changes within the cerebral white matter. Electronically Signed   By: Kellie Simmering D.O.   On: 09/17/2021 11:03   US Venous Img Lower Unilateral Left (DVT)  Result Date: 09/17/2021 CLINICAL DATA:  Left lower extremity edema. History of previous DVT. Evaluate for acute or chronic DVT. EXAM: LEFT LOWER EXTREMITY VENOUS DOPPLER ULTRASOUND TECHNIQUE: Gray-scale sonography with graded compression, as well as color Doppler and duplex ultrasound were performed to evaluate the lower extremity deep venous systems from the level of the common femoral vein and including the common femoral, femoral, profunda femoral, popliteal and calf veins including the posterior tibial, peroneal and gastrocnemius veins when visible. The superficial great saphenous vein was also interrogated. Spectral Doppler was utilized to evaluate flow at rest and with distal augmentation maneuvers in the common femoral, femoral and popliteal veins. COMPARISON:  Left lower extremity venous Doppler ultrasound-08/17/2021 (positive for nonocclusive DVT involving the left femoral vein as well as SVT involving the gastrocnemius/soleus vein). FINDINGS: Contralateral Common Femoral Vein: Respiratory phasicity is normal and symmetric with the symptomatic side. No evidence of thrombus. Normal compressibility. Common Femoral Vein: There is an echogenic nonocclusive septation involving the left common femoral vein (image 2 and 3, progressed compared to the 07/2021 examination. Saphenofemoral Junction: Nonocclusive wall thickening involving the saphenofemoral junction (image 16), progressed compared to the 07/2021 examination. Profunda Femoral Vein: No evidence of thrombus. Normal compressibility and flow on color Doppler imaging. Femoral Vein: Redemonstrated nonocclusive wall thickening and septation involving the proximal (image 21), mid (images 27 and 32) and distal (image 35) aspects of the left femoral vein,  similar to the 07/2021 examination. There is a eccentric mural calcification involving the proximal aspect of the left femoral vein (image 28), suggestive of chronic DVT. Popliteal Vein: No evidence of acute or chronic thrombus. Normal compressibility, respiratory phasicity and response to augmentation. Calf Veins: No evidence of thrombus. Normal compressibility and flow on color Doppler imaging. Superficial Great Saphenous Vein: No evidence of thrombus. Normal compressibility. Other Findings: Note is made of an approximately 6.2 x 3.9 x 1.6 cm minimally complex left-sided Baker's cyst (images 44 through 49). IMPRESSION: 1. No evidence of acute DVT within the left lower extremity. 2. Slight progression of chronic nonocclusive left lower extremity DVT, with similar appearance of nonocclusive wall thickening and internal septation involving the left femoral vein though now with nonocclusive wall thickening involving the left common femoral vein and the  saphenofemoral junction, new compared to the 07/2021 examination. Electronically Signed   By: Sandi Mariscal M.D.   On: 09/17/2021 14:45   DG Chest Port 1 View  Result Date: 09/17/2021 CLINICAL DATA:  Altered mental status with fever and tachycardia. EXAM: PORTABLE CHEST 1 VIEW COMPARISON:  August 18, 2021 FINDINGS: There is no evidence of acute infiltrate, pleural effusion or pneumothorax. The heart size and mediastinal contours are within normal limits. There is mild calcification of the aortic arch. The visualized skeletal structures are unremarkable. IMPRESSION: No active disease. Electronically Signed   By: Virgina Norfolk M.D.   On: 09/17/2021 02:40   DG HIP PORT UNILAT WITH PELVIS 1V LEFT  Result Date: 09/17/2021 CLINICAL DATA:  LEFT hip pain since this morning EXAM: DG HIP (WITH OR WITHOUT PELVIS) 1V PORT LEFT COMPARISON:  None FINDINGS: Osseous demineralization. Degenerative changes at pubic symphysis, mild. Mild narrowing of hip joints bilaterally.  SI joints preserved. No acute fracture, dislocation, or bone destruction. Degenerative disc and facet disease changes at visualized lower lumbar spine. IMPRESSION: Mild degenerative changes of the hip joints and pubic symphysis. No acute osseous abnormalities. Degenerative disc and facet disease changes at visualized lower lumbar spine. Electronically Signed   By: Lavonia Dana M.D.   On: 09/17/2021 11:47        Scheduled Meds:  apixaban  5 mg Oral BID   Chlorhexidine Gluconate Cloth  6 each Topical Q0600   metoprolol tartrate  25 mg Oral BID   mupirocin ointment  1 application Nasal BID   potassium chloride  20 mEq Oral BID   pravastatin  20 mg Oral q1800   QUEtiapine  25 mg Oral BID   Vitamin D (Ergocalciferol)  50,000 Units Oral Weekly   Continuous Infusions:  sodium chloride 125 mL/hr at 09/18/21 0526   ceFEPime (MAXIPIME) IV 2 g (09/18/21 1136)   vancomycin 1,500 mg (09/17/21 2209)     LOS: 1 day    Time spent: 32 minutes    Barb Merino, MD [facility] Pager [phone]

## 2021-09-18 NOTE — Progress Notes (Signed)
HOSPITAL MEDICINE OVERNIGHT EVENT NOTE    Notified by nursing that patient is exhibiting an elevated yellow mews score.  Chart reviewed, patient is currently being treated for sepsis thought to be secondary to a urinary tract infection.  Patient is currently receiving intravenous cefepime and vancomycin and is currently on continuous isotonic saline infusion.  Nursing reports the patient has been exhibiting bouts of agitation for which she received a dose of Haldol but otherwise has had no acute clinical change.  Haldol has appeared to be successful.  Patient continues to exhibit tachycardia but otherwise is hemodynamically stable.  Continue to monitor patient closely, continue current plan of care including broad-spectrum antibiotics and fluids.  Nursing performing frequent bedside assessments as well as vital signs every 4 hours and will notify me if there are any further clinical changes.  Vernelle Emerald  MD [facility]

## 2021-09-18 NOTE — Progress Notes (Signed)
This morning was calm and asked for water and took pills.   Recently has become agitated and refusing care and oral meds and pulling tele wires off.  Contacted Dr. Sloan Leiter and he DC's geodon and ordred Haldol.   Haldol given IV.  Refused to take pain pill.  Will retry after haldol takes effect.  Grandaughter called for update and said that prior to being hospitalized on OCT 27 and sent to snf for rehab she was driving and independent.

## 2021-09-18 NOTE — Progress Notes (Signed)
   09/18/21 0842 09/18/21 1345  Assess: MEWS Score  Temp 98.2 F (36.8 C) 98.7 F (37.1 C)  BP 126/81 (!) 141/73  Pulse Rate (!) 115 (!) 114  Assess: MEWS Score  MEWS Temp 0 0  MEWS Systolic 0 0  MEWS Pulse 2 2  MEWS RR 0 0  MEWS LOC 0 0  MEWS Score 2 2  MEWS Score Color Yellow Yellow  Assess: if the MEWS score is Yellow or Red  Were vital signs taken at a resting state?  --  Yes (lying down but agitated)  Focused Assessment  --  No change from prior assessment  Early Detection of Sepsis Score *See Row Information*  --  Low  Treat  Pain Score  --  6  Pain Type  --  Acute pain  Pain Location  --  Head  Pain Intervention(s)  --  Medication (See eMAR)  Breathing  --  0  Negative Vocalization  --  1  Facial Expression  --  2  Body Language  --  1  Consolability  --  1  PAINAD Score  --  5  Take Vital Signs  Increase Vital Sign Frequency   --  Yellow: Q 2hr X 2 then Q 4hr X 2, if remains yellow, continue Q 4hrs  Escalate  MEWS: Escalate  --  Yellow: discuss with charge nurse/RN and consider discussing with provider and RRT  Notify: Charge Nurse/RN  Name of Charge Nurse/RN Notified  --  Audrea Muscat [facility]  Date Charge Nurse/RN Notified  --  09/18/21  Time Charge Nurse/RN Notified  --  1400  Document  Patient Outcome  --  Other (Comment) (sleeping now)  Progress note created (see row info)  --  Yes

## 2021-09-18 NOTE — Progress Notes (Signed)
Asked for something to drink.  Drank all of applejuice and some tea.  Asking to go home.  Still restless.  New IV started.  Bed alarm set

## 2021-09-18 NOTE — Progress Notes (Signed)
Has been combative for most of day, refusing afternoon statin and has pulled out two ivs.  Had  IV haldol earlier and calmed down for a while and allowed insertion of one iv.  Refusing another now and refusing to eat and drink and is combative when being cleaned and for linen change. Yelling for Eddie Dibbles and other family members.  Contacted Dr. Sloan Leiter and he said to hold off on IV for now and he with order IM haldol and switch to po abx possibly

## 2021-09-18 NOTE — Progress Notes (Signed)
Continues to be tachycardic and combative with yelling out and being rude. Contacted Dr. Sloan Leiter and he will increase dose of seroquel and metoprolol.

## 2021-09-19 ENCOUNTER — Inpatient Hospital Stay (HOSPITAL_COMMUNITY): Payer: Medicare Other

## 2021-09-19 DIAGNOSIS — R6520 Severe sepsis without septic shock: Secondary | ICD-10-CM

## 2021-09-19 DIAGNOSIS — A419 Sepsis, unspecified organism: Principal | ICD-10-CM

## 2021-09-19 DIAGNOSIS — M899 Disorder of bone, unspecified: Secondary | ICD-10-CM

## 2021-09-19 DIAGNOSIS — G9340 Encephalopathy, unspecified: Secondary | ICD-10-CM

## 2021-09-19 MED ORDER — POLYETHYLENE GLYCOL 3350 17 G PO PACK
17.0000 g | PACK | Freq: Two times a day (BID) | ORAL | Status: DC
  Administered 2021-09-19 – 2021-09-22 (×6): 17 g via ORAL
  Filled 2021-09-19 (×7): qty 1

## 2021-09-19 MED ORDER — BISACODYL 10 MG RE SUPP
10.0000 mg | Freq: Once | RECTAL | Status: AC
  Administered 2021-09-19: 17:00:00 10 mg via RECTAL
  Filled 2021-09-19: qty 1

## 2021-09-19 MED ORDER — IOHEXOL 300 MG/ML  SOLN
100.0000 mL | Freq: Once | INTRAMUSCULAR | Status: AC | PRN
  Administered 2021-09-19: 14:00:00 100 mL via INTRAVENOUS

## 2021-09-19 MED ORDER — METOPROLOL TARTRATE 50 MG PO TABS
50.0000 mg | ORAL_TABLET | Freq: Two times a day (BID) | ORAL | Status: DC
  Administered 2021-09-19 – 2021-09-22 (×6): 50 mg via ORAL
  Filled 2021-09-19 (×6): qty 1

## 2021-09-19 NOTE — TOC Initial Note (Signed)
Transition of Care [facility]) - Initial/Assessment Note    Patient Details  Name: [name] MRN: [id number] Date of Birth: [birth year]  Transition of [facility]) CM/SW Contact:    Natasha Bence, LCSW Phone Number: 09/19/2021, 5:02 PM  Clinical Narrative:                 Patient is a 80 year old female admitted for Sepsis. CSW conducted initial assessment. Patient was fully ambulatory at baseline and able to complete ADL's independently. Patient from [place]. Ebony Hail with [facility] reported that patient was recently admitted to facility. CSW started auth for admission back to facility. TOC to follow.   Expected Discharge Plan: Skilled Nursing Facility Barriers to Discharge: Continued Medical Work up   Patient Goals and CMS Choice Patient states their goals for this hospitalization and ongoing recovery are:: Return to SNF CMS Medicare.gov Compare Post Acute Care list provided to:: Patient Choice offered to / list presented to : Patient  Expected Discharge Plan and Services Expected Discharge Plan: [place]                                              Prior Living Arrangements/Services   Lives with:: Self Patient language and need for interpreter reviewed:: Yes Do you feel safe going back to the place where you live?: Yes      Need for Family Participation in Patient Care: Yes (Comment) Care giver support system in place?: Yes (comment)   Criminal Activity/Legal Involvement Pertinent to Current Situation/Hospitalization: No - Comment as needed  Activities of Daily Living Home Assistive Devices/Equipment: None ADL Screening (condition at time of admission) Patient's cognitive ability adequate to safely complete daily activities?: No Is the patient deaf or have difficulty hearing?: No Does the patient have difficulty seeing, even when wearing glasses/contacts?: No Does the patient have difficulty concentrating, remembering, or making decisions?:  Yes Patient able to express need for assistance with ADLs?: No Does the patient have difficulty dressing or bathing?: Yes Independently performs ADLs?: No Communication: Independent Dressing (OT): Dependent Is this a change from baseline?: Pre-admission baseline Grooming: Dependent Is this a change from baseline?: Pre-admission baseline Feeding: Needs assistance Is this a change from baseline?: Pre-admission baseline Bathing: Dependent Is this a change from baseline?: Pre-admission baseline Toileting: Dependent Is this a change from baseline?: Pre-admission baseline In/Out Bed: Dependent Is this a change from baseline?: Pre-admission baseline Walks in Home: Dependent Is this a change from baseline?: Pre-admission baseline Does the patient have difficulty walking or climbing stairs?: Yes Weakness of Legs: Both Weakness of Arms/Hands: None  Permission Sought/Granted Permission sought to share information with : Family Supports Permission granted to share information with : Yes, Verbal Permission Granted  Share Information with NAME: Felter,[place]  Permission granted to share info w AGENCY: [place] granted to share info w Relationship: (Granddaughter)  Permission granted to share info w Contact Information: [phone]  Emotional Assessment       Orientation: : Oriented to Self Alcohol / Substance Use: Not Applicable Psych Involvement: No (comment)  Admission diagnosis:  Sepsis ([place]) [A41.9] Urinary tract infection without hematuria, site unspecified [N39.0] Altered mental status, unspecified altered mental status type [R41.82] Patient Active Problem List   Diagnosis Date Noted   Sepsis ([place]) 14/48/1856   Acute metabolic encephalopathy 31/49/7026   Severe protein-calorie malnutrition ([place]) 09/17/2021  Mood disorder ([place]) 09/17/2021   Pressure injury of skin 09/17/2021   Bone lesion 09/06/2021   DVT (deep venous thrombosis) ([place]) 08/17/2021    Hypokalemia 08/17/2021   HTN (hypertension) 08/17/2021   Hyperlipidemia 08/17/2021   Adenomatous colon polyp 03/18/2013   PCP:  Joyice Faster, FNP Pharmacy:   [place], [place] - [address] [place] [address] Phone: [phone] Fax: [phone]  [facility] [address], [place] - [place] [address] [place] [address] Phone: [phone] Fax: [phone]     Social Determinants of Health (SDOH) Interventions    Readmission Risk Interventions No flowsheet data found.

## 2021-09-19 NOTE — Progress Notes (Addendum)
   09/19/21 1837  Assess: MEWS Score  Temp 99.4 F (37.4 C)  BP 115/63  Pulse Rate (!) 116  Level of Consciousness Alert  SpO2 100 %  O2 Device Room Air  Assess: MEWS Score  MEWS Temp 0  MEWS Systolic 0  MEWS Pulse 2  MEWS RR 0  MEWS LOC 0  MEWS Score 2  MEWS Score Color Yellow  Assess: if the MEWS score is Yellow or Red  Were vital signs taken at a resting state? Yes  Focused Assessment No change from prior assessment  Early Detection of Sepsis Score *See Row Information* Low  MEWS guidelines implemented *See Row Information* Yes  Treat  Pain Scale 0-10  Pain Score 0  Take Vital Signs  Increase Vital Sign Frequency  Yellow: Q 2hr X 2 then Q 4hr X 2, if remains yellow, continue Q 4hrs  Escalate  MEWS: Escalate Yellow: discuss with charge nurse/RN and consider discussing with provider and RRT  Notify: Charge Nurse/RN  Name of Charge Nurse/RN Notified MaryAnn RN  Date Charge Nurse/RN Notified 09/19/21  Time Charge Nurse/RN Notified 8638  Notify: Provider  Provider Name/Title Dr Dante Gang  Date Provider Notified 09/19/21  Time Provider Notified 1771  Notification Type Call  Notification Reason Other (Comment) (continues tachy)  Md Dante Gang responded on 09/19/2021 at 1843, new orders were placed to once again increase metoprolol dose to 50mg  for upcoming shift.

## 2021-09-19 NOTE — NC FL2 (Signed)
[place] LEVEL OF CARE SCREENING TOOL     IDENTIFICATION  Patient Name: [name] Birthdate: Oct 02, 1941 Sex: female Admission Date (Current Location): 09/17/2021  [facility] and [place] Number:  [employer] and Address:  [facility] [address] [place]      Provider Number: [id number]  Attending Physician Name and Address:  Barb Merino, MD  Relative Name and Phone Number:  Fe, [place] (Granddaughter)   [phone]    Current Level of Care: Hospital Recommended Level of Care: [place] Prior Approval Number:    Date Approved/Denied:   PASRR Number: [id number]  Discharge Plan: SNF    Current Diagnoses: Patient Active Problem List   Diagnosis Date Noted   Sepsis ([place]) 00/86/7619   Acute metabolic encephalopathy 50/93/2671   Severe protein-calorie malnutrition ([place]) 09/17/2021   Mood disorder ([place]) 09/17/2021   Pressure injury of skin 09/17/2021   Bone lesion 09/06/2021   DVT (deep venous thrombosis) ([place]) 08/17/2021   Hypokalemia 08/17/2021   HTN (hypertension) 08/17/2021   Hyperlipidemia 08/17/2021   Adenomatous colon polyp 03/18/2013    Orientation RESPIRATION BLADDER Height & Weight     Self  Normal Incontinent Weight: 163 lb 9.3 oz (74.2 kg) Height:  5\' 9"  (175.3 cm)  BEHAVIORAL SYMPTOMS/MOOD NEUROLOGICAL BOWEL NUTRITION STATUS      Incontinent Diet  AMBULATORY STATUS COMMUNICATION OF NEEDS Skin   Extensive Assist Verbally PU Stage and Appropriate Care                       Personal Care Assistance Level of Assistance  Bathing, Feeding, Dressing Bathing Assistance: Maximum assistance Feeding assistance: Independent Dressing Assistance: Maximum assistance     Functional Limitations Info  Sight, Hearing, Speech Sight Info: Adequate Hearing Info: Adequate Speech Info: Adequate    SPECIAL CARE FACTORS FREQUENCY  PT (By licensed PT)     PT Frequency: 5x               Contractures Contractures Info: Not present    Additional Factors Info  Code Status, Allergies, Psychotropic Code Status Info: Full Allergies Info: No known Allergies Psychotropic Info: SEROQUEL         Current Medications (09/19/2021):  This is the current hospital active medication list Current Facility-Administered Medications  Medication Dose Route Frequency Provider Last Rate Last Admin   0.9 %  sodium chloride infusion   Intravenous Continuous Zierle-Ghosh, Asia B, DO 125 mL/hr at 09/18/21 1415 New Bag at 09/18/21 1415   acetaminophen (TYLENOL) tablet 650 mg  650 mg Oral Q6H PRN Zierle-Ghosh, Asia B, DO       Or   acetaminophen (TYLENOL) suppository 650 mg  650 mg Rectal Q6H PRN Zierle-Ghosh, Asia B, DO   650 mg at 09/18/21 2359   apixaban (ELIQUIS) tablet 5 mg  5 mg Oral BID Zierle-Ghosh, Asia B, DO   5 mg at 09/19/21 0809   ceFEPIme (MAXIPIME) 2 g in sodium chloride 0.9 % 100 mL IVPB  2 g Intravenous Q8H Ghimire, Dante Gang, MD 200 mL/hr at 09/19/21 0411 2 g at 09/19/21 0411   Chlorhexidine Gluconate Cloth 2 % PADS 6 each  6 each Topical Q0600 Barb Merino, MD   6 each at 09/18/21 0844   haloperidol (HALDOL) tablet 5 mg  5 mg Oral Q6H PRN Barb Merino, MD       Or   haloperidol lactate (HALDOL) injection 2 mg  2 mg Intravenous Q6H PRN  Barb Merino, MD   2 mg at 09/18/21 1758   metoprolol tartrate (LOPRESSOR) tablet 25 mg  25 mg Oral BID Zierle-Ghosh, Asia B, DO   25 mg at 09/19/21 0809   mupirocin ointment (BACTROBAN) 2 % 1 application  1 application Nasal BID Barb Merino, MD   1 application at 00/45/99 0810   ondansetron (ZOFRAN) tablet 4 mg  4 mg Oral Q6H PRN Zierle-Ghosh, Asia B, DO       Or   ondansetron (ZOFRAN) injection 4 mg  4 mg Intravenous Q6H PRN Zierle-Ghosh, Asia B, DO       oxyCODONE (Oxy IR/ROXICODONE) immediate release tablet 5 mg  5 mg Oral Q4H PRN Zierle-Ghosh, Asia B, DO   5 mg at 09/19/21 7741   potassium chloride SA (KLOR-CON) CR tablet 20 mEq   20 mEq Oral BID Barb Merino, MD   20 mEq at 09/19/21 0809   pravastatin (PRAVACHOL) tablet 20 mg  20 mg Oral q1800 Zierle-Ghosh, Asia B, DO       QUEtiapine (SEROQUEL) tablet 50 mg  50 mg Oral BID Barb Merino, MD   50 mg at 09/19/21 4239   Vitamin D (Ergocalciferol) (DRISDOL) capsule 50,000 Units  50,000 Units Oral Weekly Zierle-Ghosh, Asia B, DO   50,000 Units at 09/17/21 1054     Discharge Medications: Please see discharge summary for a list of discharge medications.  Relevant Imaging Results:  Relevant Lab Results:   Additional Information PT SSN [id number] [place]

## 2021-09-19 NOTE — Progress Notes (Signed)
OT Cancellation Note  Patient Details Name: [name] MRN: [id number] DOB: [birth year]   Cancelled Treatment:    Reason Eval/Treat Not Completed: Patient at procedure or test/ unavailable. Attempted to see patient for OT evaluation this AM. Patient was still at MRI when attempted. Will complete OT evaluation at a later time when able.    Ailene Ravel, OTR/L,CBIS  [phone]  09/19/2021, 9:38 AM

## 2021-09-19 NOTE — Progress Notes (Signed)
   09/19/21 0846  Assess: MEWS Score  Level of Consciousness Alert  Assess: MEWS Score  MEWS Temp 0  MEWS Systolic 0  MEWS Pulse 3  MEWS RR 0  MEWS LOC 0  MEWS Score 3  MEWS Score Color Yellow  Assess: if the MEWS score is Yellow or Red  Were vital signs taken at a resting state? Yes  Focused Assessment No change from prior assessment  Early Detection of Sepsis Score *See Row Information* Low  MEWS guidelines implemented *See Row Information* Yes  Treat  MEWS Interventions Escalated (See documentation below)  Pain Scale 0-10  Pain Score 0  Take Vital Signs  Increase Vital Sign Frequency  Yellow: Q 2hr X 2 then Q 4hr X 2, if remains yellow, continue Q 4hrs  Escalate  MEWS: Escalate Yellow: discuss with charge nurse/RN and consider discussing with provider and RRT  Notify: Charge Nurse/RN  Name of Charge Nurse/RN Notified Audrea Muscat RN  Date Charge Nurse/RN Notified 09/19/21  Time Charge Nurse/RN Notified 0900  Notify: Provider  Provider Name/Title Dr. Dante Gang  Date Provider Notified 09/19/21  Time Provider Notified 0900  Notification Type Face-to-face  Notification Reason Other (Comment) (pulse tachy)  Provider response No new orders  Date of Provider Response 09/19/21  Time of Provider Response [phone]

## 2021-09-19 NOTE — Plan of Care (Signed)
  Problem: Health Behavior/Discharge Planning: Goal: Ability to manage health-related needs will improve Outcome: Progressing   Problem: Clinical Measurements: Goal: Cardiovascular complication will be avoided Outcome: Progressing   Problem: Pain Managment: Goal: General experience of comfort will improve Outcome: Progressing   Problem: Safety: Goal: Ability to remain free from injury will improve Outcome: Progressing

## 2021-09-19 NOTE — Progress Notes (Signed)
   09/19/21 1227  Assess: MEWS Score  BP 120/65  Pulse Rate (!) 118  Resp 19  SpO2 99 %  O2 Device Room Air  Assess: MEWS Score  MEWS Temp 0  MEWS Systolic 0  MEWS Pulse 2  MEWS RR 0  MEWS LOC 0  MEWS Score 2  MEWS Score Color Yellow  Assess: if the MEWS score is Yellow or Red  Were vital signs taken at a resting state? Yes  Focused Assessment No change from prior assessment  Early Detection of Sepsis Score *See Row Information* Low  MEWS guidelines implemented *See Row Information* Yes  Treat  Pain Scale 0-10  Pain Score 0  Take Vital Signs  Increase Vital Sign Frequency  Yellow: Q 2hr X 2 then Q 4hr X 2, if remains yellow, continue Q 4hrs  Escalate  MEWS: Escalate Yellow: discuss with charge nurse/RN and consider discussing with provider and RRT  Notify: Charge Nurse/RN  Name of Charge Nurse/RN Notified Audrea Muscat RN  Date Charge Nurse/RN Notified 09/19/21  Time Charge Nurse/RN Notified 3220  Notify: Provider  Provider Name/Title Dr Dante Gang  Date Provider Notified 09/19/21  Time Provider Notified 2542  Notification Type Page  Notification Reason Other (Comment) (pulse continues tachy)  Provider response No new orders  Date of Provider Response 09/19/21  Time of Provider Response 1232

## 2021-09-19 NOTE — Progress Notes (Addendum)
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  Unable to give history.  In the emergency room she was tachycardic and tachypneic with temperature of 101.  Chest x-ray was normal.  Urine analysis with abnormal findings.  Cultures were drawn and patient was treated as sepsis protocol and admitted to the hospital.   Assessment & Plan:   Principal Problem:   Sepsis ([place]) Active Problems:   Hypokalemia   Hyperlipidemia   Acute metabolic encephalopathy   Severe protein-calorie malnutrition (HCC)   Mood disorder (HCC)   Pressure injury of skin  Sepsis present on admission suspect secondary to UTI: Blood cultures negative so far.  Urine cultures negative so far.  Remains tachycardic with agitation but no other source of infection. With negative blood cultures, will discontinue vancomycin today.  Continue cefepime with anticipated urinary source. CT scan of the abdomen pelvis with and without contrast today.  Look for any intra-abdominal infection, abnormality or malignancy.  Acute metabolic encephalopathy/agitation and disorientation in a patient with dementia: CT head negative.  Infection work-up as above.  Patient is with recently worsening behavioral issues due to underlying dementia. All-time fall precautions.  Delirium precautions. Continue Seroquel 25 mg twice a day-dose increased to 50 mg twice a day 11/27.  She keeps yelling and shouting but mostly stays in the bed. Started patient on as needed haloperidol to help with her symptom control.  Will prescribe long-term if helpful. Recent investigations including TSH, F64 folic acid were normal. MRI of the brain and MRA of  the brain with poor quality but no significant abnormalities.  History of DVT: Patient is on Eliquis.  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  Not a candidate for intervention or clot retrieval.  Will treat with anticoagulation.  Severe protein calorie malnutrition: Attempt improving nutrition.  Continue maintenance IV fluid today.  Nutrition to see.  She will eat with assist.  "Pressure Injury 09/17/21 Buttocks Medial Stage 2 -  Partial thickness loss of dermis presenting as a shallow open injury with a red, pink wound bed without slough. Present on admission: yes"  DVT prophylaxis: SCDs Start: 09/17/21 0655 apixaban (ELIQUIS) tablet 5 mg   Code Status: Full code Family Communication: Granddaughter at the bedside. Disposition Plan: Status is: Inpatient  Remains inpatient appropriate because: Altered mental status.  IV antibiotics.  No meaningful oral intake.         Consultants:  None  Procedures:  None  Antimicrobials:  Vancomycin and cefepime 11/25----11/28 Cefepime 11/25---  Subjective:  Patient seen and examined.  Overnight events noted.  She will be yelling and shouting.  Remains tachycardic with sinus tachycardia.  IV line was reinserted. Patient looks quiet and comfortable.  She is able to answer basic questions and move her extremities.  Granddaughter at the bedside.  She declined most of the breakfast and only ate 25% of it. Even though she is making noises and shouting/patient is not Trying to get out of the bed.  Objective: Vitals:   09/19/21 0056 09/19/21 0412 09/19/21 0816 09/19/21 0926  BP:  117/64 122/73   Pulse:  (!) 125 (!) 134 (!) 122  Resp:  18 19   Temp: 99.7 F (37.6 C) 99.6 F (  37.6 C) 99.8 F (37.7 C)   TempSrc: Oral Oral Oral   SpO2:  98% 99%   Weight:      Height:        Intake/Output Summary (Last 24 hours) at 09/19/2021 0954 Last data filed at 09/18/2021 1440 Gross per 24 hour  Intake 238 ml  Output --  Net  238 ml   Filed Weights   09/17/21 0129 09/17/21 0134 09/17/21 0649  Weight: 84 kg 76.4 kg 74.2 kg    Examination:  General exam: Appears calm and comfortable while rested. Keeps eyes closed.  Answers basic questions.  Alert and awake but not oriented. Episodic agitation reported.  Not with this provider. Respiratory system: Clear to auscultation. Respiratory effort normal. Cardiovascular system: S1 & S2 heard, RRR. No JVD, murmurs, rubs, gallops or clicks.  Gastrointestinal system: Abdomen is nondistended, soft and nontender. No organomegaly or masses felt. Normal bowel sounds heard. Extremities: Symmetric 5 x 5 power.  Generalized weakness. Left leg is edematous, chronic venous stasis changes.  Distal pulses are present.  Slightly painful on mobility.     Data Reviewed: I have personally reviewed following labs and imaging studies  CBC: Recent Labs  Lab 09/17/21 0149 09/18/21 0440  WBC 9.9 7.4  NEUTROABS 7.6 5.3  HGB 11.3* 9.7*  HCT 35.9* 32.1*  MCV 86.3 88.2  PLT 491* 373   Basic Metabolic Panel: Recent Labs  Lab 09/17/21 0149 09/18/21 0440  NA 143 144  K 3.1* 3.4*  CL 106 113*  CO2 23 24  GLUCOSE 125* 102*  BUN 31* 20  CREATININE 0.61 0.52  CALCIUM 9.3 8.6*  MG  --  1.7   GFR: Estimated Creatinine Clearance: 60.6 mL/min (by C-G formula based on SCr of 0.52 mg/dL). Liver Function Tests: Recent Labs  Lab 09/17/21 0149 09/18/21 0440  AST 44* 32  ALT 43 36  ALKPHOS 53 41  BILITOT 1.1 0.7  PROT 6.8 5.4*  ALBUMIN 2.2* 1.7*   No results for input(s): LIPASE, AMYLASE in the last 168 hours. No results for input(s): AMMONIA in the last 168 hours. Coagulation Profile: Recent Labs  Lab 09/17/21 0149 09/18/21 0440  INR 2.5* 2.2*   Cardiac Enzymes: No results for input(s): CKTOTAL, CKMB, CKMBINDEX, TROPONINI in the last 168 hours. BNP (last 3 results) No results for input(s): PROBNP in the last 8760 hours. HbA1C: No results for input(s): HGBA1C in  the last 72 hours. CBG: No results for input(s): GLUCAP in the last 168 hours. Lipid Profile: No results for input(s): CHOL, HDL, LDLCALC, TRIG, CHOLHDL, LDLDIRECT in the last 72 hours. Thyroid Function Tests: Recent Labs    09/18/21 0440  TSH 0.711   Anemia Panel: No results for input(s): VITAMINB12, FOLATE, FERRITIN, TIBC, IRON, RETICCTPCT in the last 72 hours. Sepsis Labs: Recent Labs  Lab 09/17/21 0149 09/17/21 0327 09/17/21 0737  PROCALCITON  --   --  <0.10  LATICACIDVEN 2.3* 2.0*  --     Recent Results (from the past 240 hour(s))  Blood Culture (routine x 2)     Status: None (Preliminary result)   Collection Time: 09/17/21  1:50 AM   Specimen: Left Antecubital; Blood  Result Value Ref Range Status   Specimen Description   Final    LEFT ANTECUBITAL BOTTLES DRAWN AEROBIC AND ANAEROBIC   Special Requests Blood Culture adequate volume  Final   Culture   Final    NO GROWTH 2 DAYS Performed at [facility], [address], [place], [place]  [address]    Report Status PENDING  Incomplete  Blood Culture (routine x 2)     Status: None (Preliminary result)   Collection Time: 09/17/21  1:56 AM   Specimen: BLOOD LEFT FOREARM  Result Value Ref Range Status   Specimen Description   Final    BLOOD LEFT FOREARM BOTTLES DRAWN AEROBIC AND ANAEROBIC   Special Requests Blood Culture adequate volume  Final   Culture   Final    NO GROWTH 2 DAYS Performed at [facility], [address], [place], [place] [address]    Report Status PENDING  Incomplete  Resp Panel by RT-PCR (Flu A&B, Covid) Nasopharyngeal Swab     Status: None   Collection Time: 09/17/21  3:18 AM   Specimen: Nasopharyngeal Swab; Nasopharyngeal(NP) swabs in vial transport medium  Result Value Ref Range Status   SARS Coronavirus 2 by RT PCR NEGATIVE NEGATIVE Final    Comment: (NOTE) SARS-CoV-2 target nucleic acids are NOT DETECTED.  The SARS-CoV-2 RNA is generally detectable in upper respiratory specimens during the  acute phase of infection. The lowest concentration of SARS-CoV-2 viral copies this assay can detect is 138 copies/mL. A negative result does not preclude SARS-Cov-2 infection and should not be used as the sole basis for treatment or other patient management decisions. A negative result may occur with  improper specimen collection/handling, submission of specimen other than nasopharyngeal swab, presence of viral mutation(s) within the areas targeted by this assay, and inadequate number of viral copies(<138 copies/mL). A negative result must be combined with clinical observations, patient history, and epidemiological information. The expected result is Negative.  Fact Sheet for Patients:  EntrepreneurPulse.com.au  Fact Sheet for Healthcare Providers:  IncredibleEmployment.be  This test is no t yet approved or cleared by the [place] FDA and  has been authorized for detection and/or diagnosis of SARS-CoV-2 by FDA under an Emergency Use Authorization (EUA). This EUA will remain  in effect (meaning this test can be used) for the duration of the COVID-19 declaration under Section 564(b)(1) of the Act, 21 U.S.C.section 360bbb-3(b)(1), unless the authorization is terminated  or revoked sooner.       Influenza A by PCR NEGATIVE NEGATIVE Final   Influenza B by PCR NEGATIVE NEGATIVE Final    Comment: (NOTE) The Xpert Xpress SARS-CoV-2/FLU/RSV plus assay is intended as an aid in the diagnosis of influenza from Nasopharyngeal swab specimens and should not be used as a sole basis for treatment. Nasal washings and aspirates are unacceptable for Xpert Xpress SARS-CoV-2/FLU/RSV testing.  Fact Sheet for Patients: EntrepreneurPulse.com.au  Fact Sheet for Healthcare Providers: IncredibleEmployment.be  This test is not yet approved or cleared by the [place] FDA and has been authorized for detection and/or  diagnosis of SARS-CoV-2 by FDA under an Emergency Use Authorization (EUA). This EUA will remain in effect (meaning this test can be used) for the duration of the COVID-19 declaration under Section 564(b)(1) of the Act, 21 U.S.C. section 360bbb-3(b)(1), unless the authorization is terminated or revoked.  Performed at [facility], [address] [place], [place] [address]   Urine Culture     Status: None   Collection Time: 09/17/21  4:20 AM   Specimen: In/Out Cath Urine  Result Value Ref Range Status   Specimen Description   Final    IN/OUT CATH URINE Performed at [facility], [address], [place], [place] [address]    Special Requests   Final    NONE Performed at [facility], [place]  [address] [place], [place] [address]    Culture   Final    NO GROWTH Performed at [facility], [place] [address], [place], [place] [address]    Report Status 09/18/2021 FINAL  Final  MRSA Next Gen by PCR, Nasal     Status: Abnormal   Collection Time: 09/17/21 11:19 AM   Specimen: Nasal Mucosa; Nasal Swab  Result Value Ref Range Status   MRSA by PCR Next Gen DETECTED (A) NOT DETECTED Final    Comment: RESULT CALLED TO, READ BACK BY AND VERIFIED WITH: PATRICIA RN @ 1302 ON [id number] BY HENDERSON L (NOTE) The GeneXpert MRSA Assay (FDA approved for NASAL specimens only), is one component of a comprehensive MRSA colonization surveillance program. It is not intended to diagnose MRSA infection nor to guide or monitor treatment for MRSA infections. Test performance is not FDA approved in patients less than 51 years old. Performed at [facility], [address] [place], [place] [address]          Radiology Studies: CT HEAD WO CONTRAST (5MM)  Result Date: 09/17/2021 CLINICAL DATA:  Mental status change, unknown cause. EXAM: CT HEAD WITHOUT CONTRAST TECHNIQUE: Contiguous axial images were obtained from the base of the skull through the vertex without intravenous contrast. COMPARISON:  Head CT  10/27/2016. FINDINGS: Brain: The examination is mild to moderately motion degraded, limiting evaluation. Cerebral volume is normal for age. Mild patchy and ill-defined hypoattenuation within the cerebral white matter, nonspecific but compatible with chronic small vessel ischemic disease. Within limitations of motion degradation, no acute intracranial hemorrhage, demarcated cortical infarct extra-axial fluid collection or intracranial mass is identified. No midline shift. Vascular: No hyperdense vessel.  Atherosclerotic calcifications Skull: Normal. Negative for fracture or focal lesion. Sinuses/Orbits: Visualized orbits show no acute finding. Trace scattered paranasal sinus mucosal thickening. IMPRESSION: Motion degraded examination, limiting evaluation. No appreciable acute intracranial abnormality. Minimal chronic small-vessel ischemic changes within the cerebral white matter. Electronically Signed   By: Kellie Simmering D.O.   On: 09/17/2021 11:03   MR ANGIO HEAD WO CONTRAST  Result Date: 09/19/2021 CLINICAL DATA:  Mental status change with persistent or worsening rapid onset dementia. EXAM: MRI HEAD WITHOUT CONTRAST MRA HEAD WITHOUT CONTRAST TECHNIQUE: Multiplanar, multi-echo pulse sequences of the brain and surrounding structures were acquired without intravenous contrast. Angiographic images of the Circle of Willis were acquired using MRA technique without intravenous contrast. COMPARISON:  Head CT from 2 days ago FINDINGS: MRI HEAD FINDINGS Truncated study which is motion degraded. Brain: No acute infarction, hemorrhage, hydrocephalus, extra-axial collection or mass lesion. Cerebral volume loss which is generalized. No evidence of chronic hemorrhage, collection, or significant white matter disease Vascular: Unremarkable Skull and upper cervical spine: Unremarkable Sinuses/Orbits: Bilateral cataract resection. MRA HEAD FINDINGS Very motion degraded, limiting utility for detecting atheromatous plaques. Major  vessels, including carotid, vertebral, basilar, and circle-of-Willis branches are patent. No gross aneurysm. IMPRESSION: Brain MRI: 1. Truncated and motion degraded brain MRI. 2. No acute or reversible finding.  No explanation for symptoms. Intracranial MRA: Significantly motion degraded study. No emergent finding or proximal occlusion/flow limiting stenosis. Electronically Signed   By: Jorje Guild M.D.   On: 09/19/2021 09:32   MR BRAIN WO CONTRAST  Result Date: 09/19/2021 CLINICAL DATA:  Mental status change with persistent or worsening rapid onset dementia. EXAM: MRI HEAD WITHOUT CONTRAST MRA HEAD WITHOUT CONTRAST TECHNIQUE: Multiplanar, multi-echo pulse sequences of the brain and surrounding structures were acquired without intravenous contrast. Angiographic images of the Circle of Willis  were acquired using MRA technique without intravenous contrast. COMPARISON:  Head CT from 2 days ago FINDINGS: MRI HEAD FINDINGS Truncated study which is motion degraded. Brain: No acute infarction, hemorrhage, hydrocephalus, extra-axial collection or mass lesion. Cerebral volume loss which is generalized. No evidence of chronic hemorrhage, collection, or significant white matter disease Vascular: Unremarkable Skull and upper cervical spine: Unremarkable Sinuses/Orbits: Bilateral cataract resection. MRA HEAD FINDINGS Very motion degraded, limiting utility for detecting atheromatous plaques. Major vessels, including carotid, vertebral, basilar, and circle-of-Willis branches are patent. No gross aneurysm. IMPRESSION: Brain MRI: 1. Truncated and motion degraded brain MRI. 2. No acute or reversible finding.  No explanation for symptoms. Intracranial MRA: Significantly motion degraded study. No emergent finding or proximal occlusion/flow limiting stenosis. Electronically Signed   By: Jorje Guild M.D.   On: 09/19/2021 09:32   US Venous Img Lower Unilateral Left (DVT)  Result Date: 09/17/2021 CLINICAL DATA:  Left  lower extremity edema. History of previous DVT. Evaluate for acute or chronic DVT. EXAM: LEFT LOWER EXTREMITY VENOUS DOPPLER ULTRASOUND TECHNIQUE: Gray-scale sonography with graded compression, as well as color Doppler and duplex ultrasound were performed to evaluate the lower extremity deep venous systems from the level of the common femoral vein and including the common femoral, femoral, profunda femoral, popliteal and calf veins including the posterior tibial, peroneal and gastrocnemius veins when visible. The superficial great saphenous vein was also interrogated. Spectral Doppler was utilized to evaluate flow at rest and with distal augmentation maneuvers in the common femoral, femoral and popliteal veins. COMPARISON:  Left lower extremity venous Doppler ultrasound-08/17/2021 (positive for nonocclusive DVT involving the left femoral vein as well as SVT involving the gastrocnemius/soleus vein). FINDINGS: Contralateral Common Femoral Vein: Respiratory phasicity is normal and symmetric with the symptomatic side. No evidence of thrombus. Normal compressibility. Common Femoral Vein: There is an echogenic nonocclusive septation involving the left common femoral vein (image 2 and 3, progressed compared to the 07/2021 examination. Saphenofemoral Junction: Nonocclusive wall thickening involving the saphenofemoral junction (image 16), progressed compared to the 07/2021 examination. Profunda Femoral Vein: No evidence of thrombus. Normal compressibility and flow on color Doppler imaging. Femoral Vein: Redemonstrated nonocclusive wall thickening and septation involving the proximal (image 21), mid (images 27 and 32) and distal (image 35) aspects of the left femoral vein, similar to the 07/2021 examination. There is a eccentric mural calcification involving the proximal aspect of the left femoral vein (image 28), suggestive of chronic DVT. Popliteal Vein: No evidence of acute or chronic thrombus. Normal compressibility,  respiratory phasicity and response to augmentation. Calf Veins: No evidence of thrombus. Normal compressibility and flow on color Doppler imaging. Superficial Great Saphenous Vein: No evidence of thrombus. Normal compressibility. Other Findings: Note is made of an approximately 6.2 x 3.9 x 1.6 cm minimally complex left-sided Baker's cyst (images 44 through 49). IMPRESSION: 1. No evidence of acute DVT within the left lower extremity. 2. Slight progression of chronic nonocclusive left lower extremity DVT, with similar appearance of nonocclusive wall thickening and internal septation involving the left femoral vein though now with nonocclusive wall thickening involving the left common femoral vein and the saphenofemoral junction, new compared to the 07/2021 examination. Electronically Signed   By: Sandi Mariscal M.D.   On: 09/17/2021 14:45   DG HIP PORT UNILAT WITH PELVIS 1V LEFT  Result Date: 09/17/2021 CLINICAL DATA:  LEFT hip pain since this morning EXAM: DG HIP (WITH OR WITHOUT PELVIS) 1V PORT LEFT COMPARISON:  None FINDINGS: Osseous demineralization. Degenerative changes at  pubic symphysis, mild. Mild narrowing of hip joints bilaterally. SI joints preserved. No acute fracture, dislocation, or bone destruction. Degenerative disc and facet disease changes at visualized lower lumbar spine. IMPRESSION: Mild degenerative changes of the hip joints and pubic symphysis. No acute osseous abnormalities. Degenerative disc and facet disease changes at visualized lower lumbar spine. Electronically Signed   By: Lavonia Dana M.D.   On: 09/17/2021 11:47        Scheduled Meds:  apixaban  5 mg Oral BID   Chlorhexidine Gluconate Cloth  6 each Topical Q0600   metoprolol tartrate  25 mg Oral BID   mupirocin ointment  1 application Nasal BID   potassium chloride  20 mEq Oral BID   pravastatin  20 mg Oral q1800   QUEtiapine  50 mg Oral BID   Vitamin D (Ergocalciferol)  50,000 Units Oral Weekly   Continuous Infusions:   sodium chloride 125 mL/hr at 09/18/21 1415   ceFEPime (MAXIPIME) IV 2 g (09/19/21 0411)     LOS: 2 days    Time spent: 30 minutes    Barb Merino, MD [facility] Pager [phone]

## 2021-09-19 NOTE — Consult Note (Signed)
[facility] Consultation Oncology  Name: [name]      MRN: [id number]    Location: T903/E092-33  Date: 09/19/2021 Time:6:16 PM   REFERRING PHYSICIAN: Dr. Sloan Leiter  REASON FOR CONSULT: Sclerotic sternal lesions   DIAGNOSIS: Worsening confusion likely secondary to infection.  HISTORY OF PRESENT ILLNESS: [name] is a 80 year old African-American female who was evaluated by me in the office for incidental finding of sclerotic sternal lesions when a CT scan was done for evaluation of pulmonary embolism.  She was diagnosed with left leg DVT at that time.  She is a resident at nursing home.  She presented to the ER with altered mental status.  She was being treated for possible urinary infection.  She had brain MRI/MRI done today.  PAST MEDICAL HISTORY:   Past Medical History:  Diagnosis Date   Arthritis    Depression    HTN (hypertension)    Hypercholesteremia    S/P colonoscopy 2009   adenoma, repeat 5 years    ALLERGIES: No Known Allergies    MEDICATIONS: I have reviewed the patient's current medications.     PAST SURGICAL HISTORY Past Surgical History:  Procedure Laterality Date   CATARACT EXTRACTION W/PHACO Right 04/14/2014   Procedure: CATARACT EXTRACTION PHACO AND INTRAOCULAR LENS PLACEMENT (IOC) CDE=6.22;  Surgeon: Elta Guadeloupe T. Gershon Crane, MD;  Location: [facility];  Service: Ophthalmology;  Laterality: Right;   CATARACT EXTRACTION W/PHACO Left 06/16/2014   Procedure: CATARACT EXTRACTION PHACO AND INTRAOCULAR LENS PLACEMENT (IOC);  Surgeon: Elta Guadeloupe T. Gershon Crane, MD;  Location: [facility];  Service: Ophthalmology;  Laterality: Left;  CDE:8.01   COLONOSCOPY  02/17/2005   AQT:MAUQJF polyp and right colon polyp, status post snare polypectomy/Otherwise normal rectum and colon   COLONOSCOPY  02/18/2008   RMR: Normal rectum/Polyp in the hepatic flexure removed with snare cecal polyps were cold biopsied/removed; remainder of the colonic mucosa appeared normal. Tubluar adenoma   COLONOSCOPY  N/A 03/26/2013   Procedure: COLONOSCOPY;  Surgeon: Daneil Dolin, MD;  Location: [facility];  Service: Endoscopy;  Laterality: N/A;  1:15   COLONOSCOPY N/A 06/05/2018   Procedure: COLONOSCOPY;  Surgeon: Daneil Dolin, MD;  Location: [facility];  Service: Endoscopy;  Laterality: N/A;  10:30   TOTAL ABDOMINAL HYSTERECTOMY     fibroid tumors   UTERINE FIBROID SURGERY      FAMILY HISTORY: Family History  Problem Relation Age of Onset   Stroke Brother    Stroke Mother        deceased   Emphysema Father        deceased   Colon cancer Neg Hx     SOCIAL HISTORY:  reports that she has never smoked. She has never used smokeless tobacco. She reports that she does not drink alcohol and does not use drugs.  PERFORMANCE STATUS: The patient's performance status is 3 - Symptomatic, >50% confined to bed  PHYSICAL EXAM: Most Recent Vital Signs: Blood pressure 120/65, pulse (!) 112, temperature 99.8 F (37.7 C), temperature source Oral, resp. rate 19, height 5\' 9"  (1.753 m), weight 163 lb 9.3 oz (74.2 kg), SpO2 99 %. BP 120/65 (BP Location: Right Arm)   Pulse (!) 112   Temp 99.8 F (37.7 C) (Oral)   Resp 19   Ht 5\' 9"  (1.753 m)   Wt 163 lb 9.3 oz (74.2 kg)   SpO2 99%   BMI 24.16 kg/m  General appearance: alert and delirious Chest is bilaterally clear to auscultation. Extremities has no edema  cyanosis.  LABORATORY DATA:  Results for orders placed or performed during the hospital encounter of 09/17/21 (from the past 48 hour(s))  Protime-INR     Status: Abnormal   Collection Time: 09/18/21  4:40 AM  Result Value Ref Range   Prothrombin Time 24.7 (H) 11.4 - 15.2 seconds   INR 2.2 (H) 0.8 - 1.2    Comment: (NOTE) INR goal varies based on device and disease states. Performed at [facility], [address], [place], [place] [address]   Cortisol-am, blood     Status: None   Collection Time: 09/18/21  4:40 AM  Result Value Ref Range   Cortisol - AM 17.6 6.7 - 22.6 ug/dL     Comment: Performed at [facility] [address], [place], [place] [address]  Comprehensive metabolic panel     Status: Abnormal   Collection Time: 09/18/21  4:40 AM  Result Value Ref Range   Sodium 144 135 - 145 mmol/L   Potassium 3.4 (L) 3.5 - 5.1 mmol/L   Chloride 113 (H) 98 - 111 mmol/L   CO2 24 22 - 32 mmol/L   Glucose, Bld 102 (H) 70 - 99 mg/dL    Comment: Glucose reference range applies only to samples taken after fasting for at least 8 hours.   BUN 20 8 - 23 mg/dL   Creatinine, Ser 0.52 0.44 - 1.00 mg/dL   Calcium 8.6 (L) 8.9 - 10.3 mg/dL   Total Protein 5.4 (L) 6.5 - 8.1 g/dL   Albumin 1.7 (L) 3.5 - 5.0 g/dL   AST 32 15 - 41 U/L   ALT 36 0 - 44 U/L   Alkaline Phosphatase 41 38 - 126 U/L   Total Bilirubin 0.7 0.3 - 1.2 mg/dL   GFR, Estimated >60 >60 mL/min    Comment: (NOTE) Calculated using the CKD-EPI Creatinine Equation (2021)    Anion gap 7 5 - 15    Comment: Performed at [facility], [address] [place], [place] [address]  Magnesium     Status: None   Collection Time: 09/18/21  4:40 AM  Result Value Ref Range   Magnesium 1.7 1.7 - 2.4 mg/dL    Comment: Performed at [facility], [address], [place], [place] [address]  CBC WITH DIFFERENTIAL     Status: Abnormal   Collection Time: 09/18/21  4:40 AM  Result Value Ref Range   WBC 7.4 4.0 - 10.5 K/uL   RBC 3.64 (L) 3.87 - 5.11 MIL/uL   Hemoglobin 9.7 (L) 12.0 - 15.0 g/dL   HCT 32.1 (L) 36.0 - 46.0 %   MCV 88.2 80.0 - 100.0 fL   MCH 26.6 26.0 - 34.0 pg   MCHC 30.2 30.0 - 36.0 g/dL   RDW 13.5 11.5 - 15.5 %   Platelets 392 150 - 400 K/uL   nRBC 0.0 0.0 - 0.2 %   Neutrophils Relative % 71 %   Neutro Abs 5.3 1.7 - 7.7 K/uL   Lymphocytes Relative 16 %   Lymphs Abs 1.2 0.7 - 4.0 K/uL   Monocytes Relative 11 %   Monocytes Absolute 0.8 0.1 - 1.0 K/uL   Eosinophils Relative 1 %   Eosinophils Absolute 0.1 0.0 - 0.5 K/uL   Basophils Relative 0 %   Basophils Absolute 0.0 0.0 - 0.1 K/uL   Immature  Granulocytes 1 %   Abs Immature Granulocytes 0.05 0.00 - 0.07 K/uL    Comment: Performed at [facility], [address],  [place], [place] [address]  TSH     Status: None   Collection Time: 09/18/21  4:40 AM  Result Value Ref Range   TSH 0.711 0.350 - 4.500 uIU/mL    Comment: Performed by a 3rd Generation assay with a functional sensitivity of <=0.01 uIU/mL. Performed at 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 EXAM: MRI HEAD WITHOUT CONTRAST MRA HEAD WITHOUT CONTRAST TECHNIQUE: Multiplanar, multi-echo pulse sequences of the brain and surrounding structures were acquired without intravenous contrast. Angiographic images of the Circle of Willis were acquired using MRA technique without intravenous contrast. COMPARISON:  Head CT from 2 days ago FINDINGS: MRI HEAD FINDINGS Truncated study which is motion degraded. Brain: No acute infarction, hemorrhage, hydrocephalus, extra-axial collection or mass lesion. Cerebral volume loss which is generalized. No evidence of chronic hemorrhage, collection, or significant white matter disease Vascular: Unremarkable Skull and upper cervical spine: Unremarkable Sinuses/Orbits: Bilateral cataract resection. MRA HEAD FINDINGS Very motion degraded, limiting utility for detecting atheromatous plaques. Major vessels, including carotid, vertebral, basilar, and circle-of-Willis branches are patent. No gross aneurysm. IMPRESSION: Brain MRI: 1. Truncated and motion degraded brain MRI. 2. No acute or reversible finding.  No explanation for symptoms. Intracranial MRA: Significantly motion degraded study. No emergent finding or proximal occlusion/flow limiting stenosis. Electronically Signed   By: Jorje Guild M.D.   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 EXAM: MRI HEAD WITHOUT CONTRAST MRA HEAD WITHOUT CONTRAST TECHNIQUE: Multiplanar, multi-echo pulse sequences of the brain and surrounding structures were acquired without intravenous contrast. Angiographic images of the Circle of Willis were acquired using MRA technique without intravenous contrast. COMPARISON:  Head CT from 2 days ago FINDINGS: MRI HEAD FINDINGS Truncated study which is motion degraded. Brain: No acute infarction, hemorrhage, hydrocephalus, extra-axial collection or mass lesion. Cerebral volume loss which is generalized. No evidence of chronic hemorrhage, collection, or significant white matter disease Vascular: Unremarkable Skull and upper cervical spine: Unremarkable Sinuses/Orbits: Bilateral cataract resection. MRA HEAD FINDINGS Very motion degraded, limiting utility for detecting atheromatous plaques. Major vessels, including carotid, vertebral, basilar, and circle-of-Willis branches are patent. No gross aneurysm. IMPRESSION: Brain MRI: 1. Truncated and motion degraded brain MRI. 2. No acute or reversible finding.  No explanation for symptoms. Intracranial MRA: Significantly motion degraded study. No emergent finding or proximal occlusion/flow limiting stenosis. Electronically Signed   By: Jorje Guild M.D.   On: 09/19/2021 09:32   CT ABDOMEN PELVIS W CONTRAST  Result Date: 09/19/2021 CLINICAL DATA:  Sepsis. Persistent fever and tachycardia. Evaluate for source of infection or malignancy. EXAM: CT ABDOMEN AND PELVIS WITH CONTRAST TECHNIQUE: Multidetector CT imaging of the abdomen and pelvis was performed using the standard protocol following bolus administration of intravenous contrast. CONTRAST:  164mL OMNIPAQUE IOHEXOL 300 MG/ML  SOLN COMPARISON:  Chest CT 08/17/2021 FINDINGS: Lower chest: Small pleural-based densities are similar to the previous chest CT. No large pleural effusions. Coronary artery  calcifications. Hepatobiliary: Normal appearance of the liver, gallbladder and portal venous system. Pancreas: Unremarkable. No pancreatic ductal dilatation or surrounding inflammatory changes. Spleen: Normal in size without focal abnormality. Adrenals/Urinary Tract: Normal adrenal glands. Low-density structures in both kidneys are suggestive for cysts. Negative for hydronephrosis. Normal appearance of the urinary bladder. Stomach/Bowel:  Large amount of stool in the rectum. No evidence for bowel distention or obstruction. Mild wall thickening near the duodenal bulb is nonspecific. No gastric distension. Vascular/Lymphatic: Aortic atherosclerosis. No enlarged abdominal or pelvic lymph nodes. Reproductive: Status post hysterectomy. No adnexal masses. However, there appears to be trace fluid in the expected region of the right adnexa. Other: Trace fluid in the right adnexal region. Otherwise, no free fluid. There is perirectal and presacral edema. Musculoskeletal: Again noted are small sclerotic foci involving the lower sternum. There is lucency along the anterior aspect of the L5 vertebral body but suspect this represents a large Schmorl's node. Disc space narrowing with endplate sclerosis at L2 and L3. Grade 1 anterolisthesis of L4 on L5 is likely secondary to facet arthropathy. Disc space narrowing at L5-S1. IMPRESSION: 1. No findings in the abdomen or pelvis to explain sepsis or fever. 2. Again noted are small sclerotic foci in the sternum that are nonspecific. Findings are similar to the prior chest CT and metastatic disease cannot be excluded. 3. Trace fluid on the right side of the pelvis possibly involving the right adnexa. Based on the concern for infection and malignancy, consider a follow-up pelvic ultrasound to confirm that this is simple fluid. 4. Bilateral renal cysts. 5. Perirectal and presacral edema associated with a large amount of stool in the rectum. Findings could be related to constipation. 6.   Aortic Atherosclerosis (ICD10-I70.0). Electronically Signed   By: Markus Daft M.D.   On: 09/19/2021 15:18         ASSESSMENT:  1.  Sternal sclerotic lesions: - Incidental finding on CT angiogram on 08/17/2021 with multiple areas of increased sclerosis within the sternum and manubrium.  Posterior wall of the manubrium is ill-defined.  PLAN:  1.  Sternal sclerotic lesions: - I have reviewed myeloma panel from her last visit from 09/06/2021.  M spike is negative.  Immunofixation was normal.  Free light chain ratio is normal although kappa and lambda light chains are slightly elevated.  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 - She was unable to do the whole-body bone scan which was ordered at her visit in our clinic. - Calcium was within normal limits. - This is unlikely contributing to her mental status. - Consider bone scan if feasible during this admission.  2.  Sepsis: - Tachycardic and tachypneic with temperature 101 in the ER. - Started on broad-spectrum antibiotics.  Blood cultures and urine cultures negative.  She is on cefepime at this time. - CT abdomen and pelvis did not show any evidence of sepsis/fever etiologies.  Small sclerotic foci in the sternum are nonspecific and stable.  Trace fluid in the right side of the pelvis involving the right adnexa.  Bilateral renal cysts. - MRI/MRA did not show any significant symptoms although limited by motion artifact.  All questions were answered. The patient knows to call the clinic with any problems, questions or concerns. We can certainly see the patient much sooner if necessary.    Derek Jack

## 2021-09-20 DIAGNOSIS — G464 Cerebellar stroke syndrome: Secondary | ICD-10-CM

## 2021-09-20 DIAGNOSIS — R6520 Severe sepsis without septic shock: Secondary | ICD-10-CM | POA: Diagnosis not present

## 2021-09-20 DIAGNOSIS — G9340 Encephalopathy, unspecified: Secondary | ICD-10-CM | POA: Diagnosis not present

## 2021-09-20 DIAGNOSIS — F03911 Unspecified dementia, unspecified severity, with agitation: Secondary | ICD-10-CM | POA: Diagnosis not present

## 2021-09-20 DIAGNOSIS — Z515 Encounter for palliative care: Secondary | ICD-10-CM

## 2021-09-20 DIAGNOSIS — Z7189 Other specified counseling: Secondary | ICD-10-CM | POA: Diagnosis not present

## 2021-09-20 DIAGNOSIS — A419 Sepsis, unspecified organism: Secondary | ICD-10-CM | POA: Diagnosis not present

## 2021-09-20 DIAGNOSIS — I499 Cardiac arrhythmia, unspecified: Secondary | ICD-10-CM | POA: Diagnosis not present

## 2021-09-20 LAB — COMPREHENSIVE METABOLIC PANEL
ALT: 28 U/L (ref 0–44)
AST: 16 U/L (ref 15–41)
Albumin: 1.7 g/dL — ABNORMAL LOW (ref 3.5–5.0)
Alkaline Phosphatase: 42 U/L (ref 38–126)
Anion gap: 7 (ref 5–15)
BUN: 13 mg/dL (ref 8–23)
CO2: 24 mmol/L (ref 22–32)
Calcium: 8.4 mg/dL — ABNORMAL LOW (ref 8.9–10.3)
Chloride: 110 mmol/L (ref 98–111)
Creatinine, Ser: 0.44 mg/dL (ref 0.44–1.00)
GFR, Estimated: >60 mL/min (ref >60)
Glucose, Bld: 114 mg/dL — ABNORMAL HIGH (ref 70–99)
Potassium: 3.2 mmol/L — ABNORMAL LOW (ref 3.5–5.1)
Sodium: 141 mmol/L (ref 135–145)
Total Bilirubin: 0.8 mg/dL (ref 0.3–1.2)
Total Protein: 5.4 g/dL — ABNORMAL LOW (ref 6.5–8.1)

## 2021-09-20 LAB — MAGNESIUM: Magnesium: 1.7 mg/dL (ref 1.7–2.4)

## 2021-09-20 LAB — CBC WITH DIFFERENTIAL/PLATELET
Abs Immature Granulocytes: 0.06 10*3/uL (ref 0.00–0.07)
Basophils Absolute: 0 10*3/uL (ref 0.0–0.1)
Basophils Relative: 0 %
Eosinophils Absolute: 0 10*3/uL (ref 0.0–0.5)
Eosinophils Relative: 1 %
HCT: 34.1 % — ABNORMAL LOW (ref 36.0–46.0)
Hemoglobin: 10.5 g/dL — ABNORMAL LOW (ref 12.0–15.0)
Immature Granulocytes: 1 %
Lymphocytes Relative: 15 %
Lymphs Abs: 1.2 10*3/uL (ref 0.7–4.0)
MCH: 26.3 pg (ref 26.0–34.0)
MCHC: 30.8 g/dL (ref 30.0–36.0)
MCV: 85.5 fL (ref 80.0–100.0)
Monocytes Absolute: 0.8 10*3/uL (ref 0.1–1.0)
Monocytes Relative: 10 %
Neutro Abs: 6.1 10*3/uL (ref 1.7–7.7)
Neutrophils Relative %: 73 %
Platelets: 436 10*3/uL — ABNORMAL HIGH (ref 150–400)
RBC: 3.99 MIL/uL (ref 3.87–5.11)
RDW: 13.7 % (ref 11.5–15.5)
WBC: 8.3 10*3/uL (ref 4.0–10.5)
nRBC: 0 % (ref 0.0–0.2)

## 2021-09-20 LAB — PHOSPHORUS: Phosphorus: 2.5 mg/dL (ref 2.5–4.6)

## 2021-09-20 MED ORDER — POTASSIUM CHLORIDE CRYS ER 20 MEQ PO TBCR
EXTENDED_RELEASE_TABLET | ORAL | Status: AC
  Filled 2021-09-20: qty 2

## 2021-09-20 MED ORDER — METOPROLOL TARTRATE 50 MG PO TABS
50.0000 mg | ORAL_TABLET | Freq: Two times a day (BID) | ORAL | Status: AC
Start: 2021-09-20 — End: ?

## 2021-09-20 MED ORDER — POLYETHYLENE GLYCOL 3350 17 G PO PACK: 17.0000 g | PACK | Freq: Every day | ORAL | 0 refills | Status: AC

## 2021-09-20 MED ORDER — OXYCODONE HCL 5 MG PO TABS: 5.0000 mg | ORAL_TABLET | ORAL | 0 refills | Status: AC | PRN

## 2021-09-20 MED ORDER — QUETIAPINE FUMARATE 50 MG PO TABS: 50.0000 mg | ORAL_TABLET | Freq: Two times a day (BID) | ORAL | 0 refills | Status: AC

## 2021-09-20 MED ORDER — POTASSIUM CHLORIDE CRYS ER 20 MEQ PO TBCR
20.0000 meq | EXTENDED_RELEASE_TABLET | Freq: Two times a day (BID) | ORAL | 0 refills | Status: AC
Start: 2021-09-20 — End: 2021-09-27

## 2021-09-20 MED ORDER — POTASSIUM CHLORIDE CRYS ER 20 MEQ PO TBCR
40.0000 meq | EXTENDED_RELEASE_TABLET | Freq: Two times a day (BID) | ORAL | Status: AC
  Administered 2021-09-20 – 2021-09-21 (×4): 40 meq via ORAL
  Filled 2021-09-20 (×3): qty 2

## 2021-09-20 NOTE — Discharge Summary (Addendum)
Physician Discharge Summary  [name] TXM:[id number] DOB: [birth year] DOA: 09/17/2021  PCP: Joyice Faster, FNP  Admit date: 09/17/2021 Discharge date: 09/22/2021  Admitted From: Skilled nursing facility Disposition: Skilled nursing facility  Recommendations for Outpatient Follow-up:  Follow up with PCP in 1-2 weeks Please obtain BMP/CBC/magnesium/phosphorus in one week Outpatient follow-up with palliative care recommended to further discuss goals of care and advance care planning. Patient has followed up with neurology service. as recommended for further evaluation/treatment for dementia.  Home Health: N/A Equipment/Devices: N/A  Discharge Condition: Fair CODE STATUS: Full code Diet recommendation: Low-salt diet.  Assisted feeding.  Encourage feeding all the time.  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  Unable to give history.  In the emergency room she was tachycardic and tachypneic with temperature of 101.  Chest x-ray was normal.  Urine analysis with abnormal findings.  Cultures were drawn and patient was treated as sepsis protocol and admitted to the hospital. Sepsis ruled out and possible presentation from dehydration and low oral intake.     Assessment & Plan of recommended full-thickness goals:    Sepsis present on admission suspect secondary to UTI:  Blood cultures negative so far.  Urine cultures negative so far. Unknown source of infection.  Treated with 5 days of broad spectrum antibiotics. Consider culture negative UTI.  Since we have no positive cultures and patient has clinically improved.  Discontinue all antibiotics.  No need to continue more antibiotics.  CT scan abdomen pelvis with contrast was essentially normal.   Acute metabolic  encephalopathy/agitation and disorientation in a patient with dementia: -CT head negative.  Infection work-up as above.  Patient is with recently worsening behavioral issues due to underlying dementia. -All-time fall precautions.  Delirium precautions. Continue Seroquel 50 mg twice a day -Continue constant reorientation -Recent investigations including TSH, Q82 folic acid were normal. -MRI of the brain and MRA of the brain with poor quality but no significant abnormalities. -Patient will require outpatient follow-up with neurology service for further evaluation in the setting of underlying dementia. -Low-dose Namenda To try it if further behavioral disturbances appreciated.   History of DVT:  -Patient is chronically on Eliquis.  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   -Not a candidate for intervention or clot retrieval.   -Continue anticoagulation.  Severe protein calorie malnutrition:  -Patient encouraged to continue oral intake -Continue feeding supplements.   "Pressure Injury 09/17/21 Buttocks Medial Stage 2 -  Partial thickness loss of dermis presenting as a shallow open injury with a red, pink wound bed without slough. Present on admission: yes" No signs of superimposed infection.  Goal of care: Detail discussion and counseling done to patient's granddaughter at the bedside and on the phone, patient's son on the phone.  Patient does not have any formal healthcare power of attorney paperwork, however family designates [name] as her healthcare power of attorney. Patient is developing frequent illnesses, behavior issues and dementia.  Most of the work-up has been negative. She will need all-time support and assist at the skilled nursing facility.  She will benefit with attempt to physical therapy.  She has family history of progressive dementia on her sisters. Will need goal of care discussion.  Family has agreed to meet with palliative care at the skilled  nursing facility.  Please consult.  Currently remains  full code. Also encouraged family to prepare legal paperwork including healthcare power of attorney.   Patient is medically stable today to transfer to skilled level of care.  Goal of care discussion by palliative care will help.  High risk of readmission given chronic illnesses.   Discharge Diagnoses:  Principal Problem:   Sepsis ([place]) Active Problems:   Hypokalemia   Hyperlipidemia   Acute metabolic encephalopathy   Severe protein-calorie malnutrition (HCC)   Mood disorder (HCC)   Pressure injury of skin    Discharge Instructions  Discharge Instructions     Ambulatory referral to Neurology   Complete by: As directed    An appointment is requested in approximately: 4 weeks   Diet - low sodium heart healthy   Complete by: As directed    Discharge instructions   Complete by: As directed    Consult palliative care at the SNF   Discharge wound care:   Complete by: As directed    Barrier dressing   Increase activity slowly   Complete by: As directed       Allergies as of 09/20/2021   No Known Allergies      Medication List     STOP taking these medications    lovastatin 20 MG tablet Commonly known as: MEVACOR       TAKE these medications    acetaminophen 325 MG tablet Commonly known as: TYLENOL Take 650 mg by mouth every 6 (six) hours as needed for mild pain or moderate pain.   apixaban 5 MG Tabs tablet Commonly known as: ELIQUIS Take 10mg  po bid through 11/6 then take 5mg  po bid   cetirizine 10 MG tablet Commonly known as: ZYRTEC Take 10 mg by mouth daily.   diphenhydramine-acetaminophen 25-500 MG Tabs tablet Commonly known as: TYLENOL PM Take 1 tablet by mouth at bedtime as needed (for sleep).   metoprolol tartrate 50 MG tablet Commonly known as: LOPRESSOR Take 1 tablet (50 mg total) by mouth 2 (two) times daily. What changed:  medication strength how much to take   ondansetron 4 MG  tablet Commonly known as: ZOFRAN Take 1 tablet (4 mg total) by mouth every 6 (six) hours.   oxyCODONE 5 MG immediate release tablet Commonly known as: Oxy IR/ROXICODONE Take 1 tablet (5 mg total) by mouth every 4 (four) hours as needed for up to 5 days for moderate pain.   polyethylene glycol 17 g packet Commonly known as: MIRALAX / GLYCOLAX Take 17 g by mouth daily.   potassium chloride SA 20 MEQ tablet Commonly known as: KLOR-CON M Take 1 tablet (20 mEq total) by mouth 2 (two) times daily for 7 days.   QUEtiapine 50 MG tablet Commonly known as: SEROQUEL Take 1 tablet (50 mg total) by mouth 2 (two) times daily. What changed:  medication strength how much to take when to take this   rosuvastatin 5 MG tablet Commonly known as: CRESTOR Take 5 mg by mouth daily.   Vitamin D (Ergocalciferol) 1.25 MG (50000 UNIT) Caps capsule Commonly known as: DRISDOL Take 50,000 Units by mouth once a week.               Discharge Care Instructions  (From admission, onward)           Start     Ordered   09/20/21 0000  Discharge wound care:       Comments: Barrier dressing   09/20/21 5456  Consultations: None   Procedures/Studies: CT HEAD WO CONTRAST (5MM)  Result Date: 09/17/2021 CLINICAL DATA:  Mental status change, unknown cause. EXAM: CT HEAD WITHOUT CONTRAST TECHNIQUE: Contiguous axial images were obtained from the base of the skull through the vertex without intravenous contrast. COMPARISON:  Head CT 10/27/2016. FINDINGS: Brain: The examination is mild to moderately motion degraded, limiting evaluation. Cerebral volume is normal for age. Mild patchy and ill-defined hypoattenuation within the cerebral white matter, nonspecific but compatible with chronic small vessel ischemic disease. Within limitations of motion degradation, no acute intracranial hemorrhage, demarcated cortical infarct extra-axial fluid collection or intracranial mass is identified. No  midline shift. Vascular: No hyperdense vessel.  Atherosclerotic calcifications Skull: Normal. Negative for fracture or focal lesion. Sinuses/Orbits: Visualized orbits show no acute finding. Trace scattered paranasal sinus mucosal thickening. IMPRESSION: Motion degraded examination, limiting evaluation. No appreciable acute intracranial abnormality. Minimal chronic small-vessel ischemic changes within the cerebral white matter. Electronically Signed   By: Kellie Simmering D.O.   On: 09/17/2021 11:03   MR ANGIO HEAD WO CONTRAST  Result Date: 09/19/2021 CLINICAL DATA:  Mental status change with persistent or worsening rapid onset dementia. EXAM: MRI HEAD WITHOUT CONTRAST MRA HEAD WITHOUT CONTRAST TECHNIQUE: Multiplanar, multi-echo pulse sequences of the brain and surrounding structures were acquired without intravenous contrast. Angiographic images of the Circle of Willis were acquired using MRA technique without intravenous contrast. COMPARISON:  Head CT from 2 days ago FINDINGS: MRI HEAD FINDINGS Truncated study which is motion degraded. Brain: No acute infarction, hemorrhage, hydrocephalus, extra-axial collection or mass lesion. Cerebral volume loss which is generalized. No evidence of chronic hemorrhage, collection, or significant white matter disease Vascular: Unremarkable Skull and upper cervical spine: Unremarkable Sinuses/Orbits: Bilateral cataract resection. MRA HEAD FINDINGS Very motion degraded, limiting utility for detecting atheromatous plaques. Major vessels, including carotid, vertebral, basilar, and circle-of-Willis branches are patent. No gross aneurysm. IMPRESSION: Brain MRI: 1. Truncated and motion degraded brain MRI. 2. No acute or reversible finding.  No explanation for symptoms. Intracranial MRA: Significantly motion degraded study. No emergent finding or proximal occlusion/flow limiting stenosis. Electronically Signed   By: Jorje Guild M.D.   On: 09/19/2021 09:32   MR BRAIN WO  CONTRAST  Result Date: 09/19/2021 CLINICAL DATA:  Mental status change with persistent or worsening rapid onset dementia. EXAM: MRI HEAD WITHOUT CONTRAST MRA HEAD WITHOUT CONTRAST TECHNIQUE: Multiplanar, multi-echo pulse sequences of the brain and surrounding structures were acquired without intravenous contrast. Angiographic images of the Circle of Willis were acquired using MRA technique without intravenous contrast. COMPARISON:  Head CT from 2 days ago FINDINGS: MRI HEAD FINDINGS Truncated study which is motion degraded. Brain: No acute infarction, hemorrhage, hydrocephalus, extra-axial collection or mass lesion. Cerebral volume loss which is generalized. No evidence of chronic hemorrhage, collection, or significant white matter disease Vascular: Unremarkable Skull and upper cervical spine: Unremarkable Sinuses/Orbits: Bilateral cataract resection. MRA HEAD FINDINGS Very motion degraded, limiting utility for detecting atheromatous plaques. Major vessels, including carotid, vertebral, basilar, and circle-of-Willis branches are patent. No gross aneurysm. IMPRESSION: Brain MRI: 1. Truncated and motion degraded brain MRI. 2. No acute or reversible finding.  No explanation for symptoms. Intracranial MRA: Significantly motion degraded study. No emergent finding or proximal occlusion/flow limiting stenosis. Electronically Signed   By: Jorje Guild M.D.   On: 09/19/2021 09:32   CT ABDOMEN PELVIS W CONTRAST  Result Date: 09/19/2021 CLINICAL DATA:  Sepsis. Persistent fever and tachycardia. Evaluate for source of infection or malignancy. EXAM: CT ABDOMEN AND PELVIS WITH  CONTRAST TECHNIQUE: Multidetector CT imaging of the abdomen and pelvis was performed using the standard protocol following bolus administration of intravenous contrast. CONTRAST:  144mL OMNIPAQUE IOHEXOL 300 MG/ML  SOLN COMPARISON:  Chest CT 08/17/2021 FINDINGS: Lower chest: Small pleural-based densities are similar to the previous chest CT. No  large pleural effusions. Coronary artery calcifications. Hepatobiliary: Normal appearance of the liver, gallbladder and portal venous system. Pancreas: Unremarkable. No pancreatic ductal dilatation or surrounding inflammatory changes. Spleen: Normal in size without focal abnormality. Adrenals/Urinary Tract: Normal adrenal glands. Low-density structures in both kidneys are suggestive for cysts. Negative for hydronephrosis. Normal appearance of the urinary bladder. Stomach/Bowel: Large amount of stool in the rectum. No evidence for bowel distention or obstruction. Mild wall thickening near the duodenal bulb is nonspecific. No gastric distension. Vascular/Lymphatic: Aortic atherosclerosis. No enlarged abdominal or pelvic lymph nodes. Reproductive: Status post hysterectomy. No adnexal masses. However, there appears to be trace fluid in the expected region of the right adnexa. Other: Trace fluid in the right adnexal region. Otherwise, no free fluid. There is perirectal and presacral edema. Musculoskeletal: Again noted are small sclerotic foci involving the lower sternum. There is lucency along the anterior aspect of the L5 vertebral body but suspect this represents a large Schmorl's node. Disc space narrowing with endplate sclerosis at L2 and L3. Grade 1 anterolisthesis of L4 on L5 is likely secondary to facet arthropathy. Disc space narrowing at L5-S1. IMPRESSION: 1. No findings in the abdomen or pelvis to explain sepsis or fever. 2. Again noted are small sclerotic foci in the sternum that are nonspecific. Findings are similar to the prior chest CT and metastatic disease cannot be excluded. 3. Trace fluid on the right side of the pelvis possibly involving the right adnexa. Based on the concern for infection and malignancy, consider a follow-up pelvic ultrasound to confirm that this is simple fluid. 4. Bilateral renal cysts. 5. Perirectal and presacral edema associated with a large amount of stool in the rectum. Findings  could be related to constipation. 6.  Aortic Atherosclerosis (ICD10-I70.0). Electronically Signed   By: Markus Daft M.D.   On: 09/19/2021 15:18   US Venous Img Lower Unilateral Left (DVT)  Result Date: 09/17/2021 CLINICAL DATA:  Left lower extremity edema. History of previous DVT. Evaluate for acute or chronic DVT. EXAM: LEFT LOWER EXTREMITY VENOUS DOPPLER ULTRASOUND TECHNIQUE: Gray-scale sonography with graded compression, as well as color Doppler and duplex ultrasound were performed to evaluate the lower extremity deep venous systems from the level of the common femoral vein and including the common femoral, femoral, profunda femoral, popliteal and calf veins including the posterior tibial, peroneal and gastrocnemius veins when visible. The superficial great saphenous vein was also interrogated. Spectral Doppler was utilized to evaluate flow at rest and with distal augmentation maneuvers in the common femoral, femoral and popliteal veins. COMPARISON:  Left lower extremity venous Doppler ultrasound-08/17/2021 (positive for nonocclusive DVT involving the left femoral vein as well as SVT involving the gastrocnemius/soleus vein). FINDINGS: Contralateral Common Femoral Vein: Respiratory phasicity is normal and symmetric with the symptomatic side. No evidence of thrombus. Normal compressibility. Common Femoral Vein: There is an echogenic nonocclusive septation involving the left common femoral vein (image 2 and 3, progressed compared to the 07/2021 examination. Saphenofemoral Junction: Nonocclusive wall thickening involving the saphenofemoral junction (image 16), progressed compared to the 07/2021 examination. Profunda Femoral Vein: No evidence of thrombus. Normal compressibility and flow on color Doppler imaging. Femoral Vein: Redemonstrated nonocclusive wall thickening and septation involving the proximal (  image 21), mid (images 27 and 32) and distal (image 35) aspects of the left femoral vein, similar to the  07/2021 examination. There is a eccentric mural calcification involving the proximal aspect of the left femoral vein (image 28), suggestive of chronic DVT. Popliteal Vein: No evidence of acute or chronic thrombus. Normal compressibility, respiratory phasicity and response to augmentation. Calf Veins: No evidence of thrombus. Normal compressibility and flow on color Doppler imaging. Superficial Great Saphenous Vein: No evidence of thrombus. Normal compressibility. Other Findings: Note is made of an approximately 6.2 x 3.9 x 1.6 cm minimally complex left-sided Baker's cyst (images 44 through 49). IMPRESSION: 1. No evidence of acute DVT within the left lower extremity. 2. Slight progression of chronic nonocclusive left lower extremity DVT, with similar appearance of nonocclusive wall thickening and internal septation involving the left femoral vein though now with nonocclusive wall thickening involving the left common femoral vein and the saphenofemoral junction, new compared to the 07/2021 examination. Electronically Signed   By: Sandi Mariscal M.D.   On: 09/17/2021 14:45   DG Chest Port 1 View  Result Date: 09/17/2021 CLINICAL DATA:  Altered mental status with fever and tachycardia. EXAM: PORTABLE CHEST 1 VIEW COMPARISON:  August 18, 2021 FINDINGS: There is no evidence of acute infiltrate, pleural effusion or pneumothorax. The heart size and mediastinal contours are within normal limits. There is mild calcification of the aortic arch. The visualized skeletal structures are unremarkable. IMPRESSION: No active disease. Electronically Signed   By: Virgina Norfolk M.D.   On: 09/17/2021 02:40   DG Knee Complete 4 Views Left  Result Date: 09/04/2021 CLINICAL DATA:  Pain left knee EXAM: LEFT KNEE - COMPLETE 4+ VIEW COMPARISON:  08/17/2021 FINDINGS: No recent fracture is seen. There is no significant effusion. Osteopenia is seen in bony structures. Degenerative changes are noted with bony spurs in medial, lateral  and patellofemoral compartments, more severe in the patellofemoral compartment. In the lateral view, there is mild indentation in the anterior cortical margin of distal shaft of femur without break in the cortical margins. This finding may be a congenital variation or residual change from previous injury. Small smooth marginated calcifications along the lateral margin of patella may be residual from previous injury. IMPRESSION: No recent fracture or dislocation is seen. Significant degenerative changes are noted, more severe in the patellofemoral compartment. There is no significant effusion. Electronically Signed   By: Elmer Picker M.D.   On: 09/04/2021 10:59   DG HIP PORT UNILAT WITH PELVIS 1V LEFT  Result Date: 09/17/2021 CLINICAL DATA:  LEFT hip pain since this morning EXAM: DG HIP (WITH OR WITHOUT PELVIS) 1V PORT LEFT COMPARISON:  None FINDINGS: Osseous demineralization. Degenerative changes at pubic symphysis, mild. Mild narrowing of hip joints bilaterally. SI joints preserved. No acute fracture, dislocation, or bone destruction. Degenerative disc and facet disease changes at visualized lower lumbar spine. IMPRESSION: Mild degenerative changes of the hip joints and pubic symphysis. No acute osseous abnormalities. Degenerative disc and facet disease changes at visualized lower lumbar spine. Electronically Signed   By: Lavonia Dana M.D.   On: 09/17/2021 11:47   (Echo, Carotid, EGD, Colonoscopy, ERCP)    Subjective: Patient seen and examined.  Eyes closed.  She tells me that she has no problems.  She tells me that she is fine to go back to [facility].  She ate 50% of the meal when assisted by nurse technician.  Overnight no events.  She has not needed any Haldol for last  48 hours.  After increasing dose of Seroquel, she has been mostly quiet and cooperative.   Discharge Exam: Vitals:   09/20/21 0010 09/20/21 0534  BP: 116/65 132/78  Pulse: 95 (!) 110  Resp: 18 18  Temp: 98.4 F (36.9 C)  98.2 F (36.8 C)  SpO2: 100% 100%   Vitals:   09/19/21 1837 09/19/21 2139 09/20/21 0010 09/20/21 0534  BP: 115/63 132/69 116/65 132/78  Pulse: (!) 116 (!) 122 95 (!) 110  Resp:  18 18 18   Temp: 99.4 F (37.4 C) 98.7 F (37.1 C) 98.4 F (36.9 C) 98.2 F (36.8 C)  TempSrc: Oral     SpO2: 100% 99% 100% 100%  Weight:      Height:       General exam: Appears calm and comfortable while rested. Keeps eyes closed.  Answers basic questions.  Alert and awake but not oriented. Episodic agitation reported.  Not with this provider. Respiratory system: Clear to auscultation. Respiratory effort normal. Cardiovascular system: S1 & S2 heard, RRR. No JVD, murmurs, rubs, gallops or clicks.  Gastrointestinal system: Abdomen is nondistended, soft and nontender. No organomegaly or masses felt. Normal bowel sounds heard. Extremities: Symmetric 5 x 5 power.  Generalized weakness. Left leg is edematous, chronic venous stasis changes.  Distal pulses are present.      The results of significant diagnostics from this hospitalization (including imaging, microbiology, ancillary and laboratory) are listed below for reference.     Microbiology: Recent Results (from the past 240 hour(s))  Blood Culture (routine x 2)     Status: None (Preliminary result)   Collection Time: 09/17/21  1:50 AM   Specimen: Left Antecubital; Blood  Result Value Ref Range Status   Specimen Description   Final    LEFT ANTECUBITAL BOTTLES DRAWN AEROBIC AND ANAEROBIC   Special Requests Blood Culture adequate volume  Final   Culture   Final    NO GROWTH 3 DAYS Performed at [facility], [address], [place], [place] [address]    Report Status PENDING  Incomplete  Blood Culture (routine x 2)     Status: None (Preliminary result)   Collection Time: 09/17/21  1:56 AM   Specimen: BLOOD LEFT FOREARM  Result Value Ref Range Status   Specimen Description   Final    BLOOD LEFT FOREARM BOTTLES DRAWN AEROBIC AND ANAEROBIC    Special Requests Blood Culture adequate volume  Final   Culture   Final    NO GROWTH 3 DAYS Performed at [facility], [address], [place], [place] [address]    Report Status PENDING  Incomplete  Resp Panel by RT-PCR (Flu A&B, Covid) Nasopharyngeal Swab     Status: None   Collection Time: 09/17/21  3:18 AM   Specimen: Nasopharyngeal Swab; Nasopharyngeal(NP) swabs in vial transport medium  Result Value Ref Range Status   SARS Coronavirus 2 by RT PCR NEGATIVE NEGATIVE Final    Comment: (NOTE) SARS-CoV-2 target nucleic acids are NOT DETECTED.  The SARS-CoV-2 RNA is generally detectable in upper respiratory specimens during the acute phase of infection. The lowest concentration of SARS-CoV-2 viral copies this assay can detect is 138 copies/mL. A negative result does not preclude SARS-Cov-2 infection and should not be used as the sole basis for treatment or other patient management decisions. A negative result may occur with  improper specimen collection/handling, submission of specimen other than nasopharyngeal swab, presence of viral mutation(s) within the areas targeted by this assay, and inadequate number of viral  copies(<138 copies/mL). A negative result must be combined with clinical observations, patient history, and epidemiological information. The expected result is Negative.  Fact Sheet for Patients:  EntrepreneurPulse.com.au  Fact Sheet for Healthcare Providers:  IncredibleEmployment.be  This test is no t yet approved or cleared by the [place] FDA and  has been authorized for detection and/or diagnosis of SARS-CoV-2 by FDA under an Emergency Use Authorization (EUA). This EUA will remain  in effect (meaning this test can be used) for the duration of the COVID-19 declaration under Section 564(b)(1) of the Act, 21 U.S.C.section 360bbb-3(b)(1), unless the authorization is terminated  or revoked sooner.       Influenza A by PCR  NEGATIVE NEGATIVE Final   Influenza B by PCR NEGATIVE NEGATIVE Final    Comment: (NOTE) The Xpert Xpress SARS-CoV-2/FLU/RSV plus assay is intended as an aid in the diagnosis of influenza from Nasopharyngeal swab specimens and should not be used as a sole basis for treatment. Nasal washings and aspirates are unacceptable for Xpert Xpress SARS-CoV-2/FLU/RSV testing.  Fact Sheet for Patients: EntrepreneurPulse.com.au  Fact Sheet for Healthcare Providers: IncredibleEmployment.be  This test is not yet approved or cleared by the [place] FDA and has been authorized for detection and/or diagnosis of SARS-CoV-2 by FDA under an Emergency Use Authorization (EUA). This EUA will remain in effect (meaning this test can be used) for the duration of the COVID-19 declaration under Section 564(b)(1) of the Act, 21 U.S.C. section 360bbb-3(b)(1), unless the authorization is terminated or revoked.  Performed at [facility] [facility], [address], [place], [place] [address]   Urine Culture     Status: None   Collection Time: 09/17/21  4:20 AM   Specimen: In/Out Cath Urine  Result Value Ref Range Status   Specimen Description   Final    IN/OUT CATH URINE Performed at [facility], [address] [place], [place] [address]    Special Requests   Final    NONE Performed at [facility], [address], [place], [place] [address]    Culture   Final    NO GROWTH Performed at [facility], [place] [address], [place], [place] [address]    Report Status 09/18/2021 FINAL  Final  MRSA Next Gen by PCR, Nasal     Status: Abnormal   Collection Time: 09/17/21 11:19 AM   Specimen: Nasal Mucosa; Nasal Swab  Result Value Ref Range Status   MRSA by PCR Next Gen DETECTED (A) NOT DETECTED Final    Comment: RESULT CALLED TO, READ BACK BY AND VERIFIED WITH: PATRICIA RN @ 1302 ON [id number] BY HENDERSON L (NOTE) The GeneXpert MRSA Assay (FDA approved for NASAL specimens only), is  one component of a comprehensive MRSA colonization surveillance program. It is not intended to diagnose MRSA infection nor to guide or monitor treatment for MRSA infections. Test performance is not FDA approved in patients less than 63 years old. Performed at [facility], [address] [place], [place] [address]      Labs: BNP (last 3 results) No results for input(s): BNP in the last 8760 hours. Basic Metabolic Panel: Recent Labs  Lab 09/17/21 0149 09/18/21 0440 09/20/21 0536  NA 143 144 141  K 3.1* 3.4* 3.2*  CL 106 113* 110  CO2 23 24 24   GLUCOSE 125* 102* 114*  BUN 31* 20 13  CREATININE 0.61 0.52 0.44  CALCIUM 9.3 8.6* 8.4*  MG  --  1.7 1.7  PHOS  --   --  2.5   Liver Function Tests: Recent Labs  Lab 09/17/21 0149 09/18/21 0440 09/20/21 0536  AST 44* 32 16  ALT 43 36 28  ALKPHOS 53 41 42  BILITOT 1.1 0.7 0.8  PROT 6.8 5.4* 5.4*  ALBUMIN 2.2* 1.7* 1.7*   No results for input(s): LIPASE, AMYLASE in the last 168 hours. No results for input(s): AMMONIA in the last 168 hours. CBC: Recent Labs  Lab 09/17/21 0149 09/18/21 0440 09/20/21 0536  WBC 9.9 7.4 8.3  NEUTROABS 7.6 5.3 6.1  HGB 11.3* 9.7* 10.5*  HCT 35.9* 32.1* 34.1*  MCV 86.3 88.2 85.5  PLT 491* 392 436*    Thyroid function studies Recent Labs    09/18/21 0440  TSH 0.711   Urinalysis    Component Value Date/Time   COLORURINE YELLOW 09/17/2021 0420   APPEARANCEUR CLEAR 09/17/2021 0420   LABSPEC >1.030 (H) 09/17/2021 0420   PHURINE 6.0 09/17/2021 0420   GLUCOSEU NEGATIVE 09/17/2021 0420   HGBUR NEGATIVE 09/17/2021 0420   BILIRUBINUR SMALL (A) 09/17/2021 0420   KETONESUR 40 (A) 09/17/2021 0420   PROTEINUR 30 (A) 09/17/2021 0420   UROBILINOGEN 2.0 (H) 06/05/2013 1002   NITRITE NEGATIVE 09/17/2021 0420   LEUKOCYTESUR NEGATIVE 09/17/2021 0420   Microbiology Recent Results (from the past 240 hour(s))  Blood Culture (routine x 2)     Status: None (Preliminary result)   Collection Time:  09/17/21  1:50 AM   Specimen: Left Antecubital; Blood  Result Value Ref Range Status   Specimen Description   Final    LEFT ANTECUBITAL BOTTLES DRAWN AEROBIC AND ANAEROBIC   Special Requests Blood Culture adequate volume  Final   Culture   Final    NO GROWTH 3 DAYS Performed at [facility], [address] [place], [place] [address]    Report Status PENDING  Incomplete  Blood Culture (routine x 2)     Status: None (Preliminary result)   Collection Time: 09/17/21  1:56 AM   Specimen: BLOOD LEFT FOREARM  Result Value Ref Range Status   Specimen Description   Final    BLOOD LEFT FOREARM BOTTLES DRAWN AEROBIC AND ANAEROBIC   Special Requests Blood Culture adequate volume  Final   Culture   Final    NO GROWTH 3 DAYS Performed at [facility], [address], [place], [place] [address]    Report Status PENDING  Incomplete  Resp Panel by RT-PCR (Flu A&B, Covid) Nasopharyngeal Swab     Status: None   Collection Time: 09/17/21  3:18 AM   Specimen: Nasopharyngeal Swab; Nasopharyngeal(NP) swabs in vial transport medium  Result Value Ref Range Status   SARS Coronavirus 2 by RT PCR NEGATIVE NEGATIVE Final    Comment: (NOTE) SARS-CoV-2 target nucleic acids are NOT DETECTED.  The SARS-CoV-2 RNA is generally detectable in upper respiratory specimens during the acute phase of infection. The lowest concentration of SARS-CoV-2 viral copies this assay can detect is 138 copies/mL. A negative result does not preclude SARS-Cov-2 infection and should not be used as the sole basis for treatment or other patient management decisions. A negative result may occur with  improper specimen collection/handling, submission of specimen other than nasopharyngeal swab, presence of viral mutation(s) within the areas targeted by this assay, and inadequate number of viral copies(<138 copies/mL). A negative result must be combined with clinical observations, patient history, and epidemiological information. The  expected result is Negative.  Fact Sheet for Patients:  EntrepreneurPulse.com.au  Fact Sheet for Healthcare Providers:  IncredibleEmployment.be  This test is no t yet approved or cleared by the [place] and  has been authorized for detection and/or diagnosis of SARS-CoV-2 by FDA under an Emergency Use Authorization (EUA). This EUA will remain  in effect (meaning this test can be used) for the duration of the COVID-19 declaration under Section 564(b)(1) of the Act, 21 U.S.C.section 360bbb-3(b)(1), unless the authorization is terminated  or revoked sooner.       Influenza A by PCR NEGATIVE NEGATIVE Final   Influenza B by PCR NEGATIVE NEGATIVE Final    Comment: (NOTE) The Xpert Xpress SARS-CoV-2/FLU/RSV plus assay is intended as an aid in the diagnosis of influenza from Nasopharyngeal swab specimens and should not be used as a sole basis for treatment. Nasal washings and aspirates are unacceptable for Xpert Xpress SARS-CoV-2/FLU/RSV testing.  Fact Sheet for Patients: EntrepreneurPulse.com.au  Fact Sheet for Healthcare Providers: IncredibleEmployment.be  This test is not yet approved or cleared by the [place] FDA and has been authorized for detection and/or diagnosis of SARS-CoV-2 by FDA under an Emergency Use Authorization (EUA). This EUA will remain in effect (meaning this test can be used) for the duration of the COVID-19 declaration under Section 564(b)(1) of the Act, 21 U.S.C. section 360bbb-3(b)(1), unless the authorization is terminated or revoked.  Performed at [facility], [address] [place], [place] [address]   Urine Culture     Status: None   Collection Time: 09/17/21  4:20 AM   Specimen: In/Out Cath Urine  Result Value Ref Range Status   Specimen Description   Final    IN/OUT CATH URINE Performed at [facility], [address] [place], [place] [address]    Special  Requests   Final    NONE Performed at [facility], [address] [place], [place] [address]    Culture   Final    NO GROWTH Performed at [facility], [place] [address], [place], [place] [address]    Report Status 09/18/2021 FINAL  Final  MRSA Next Gen by PCR, Nasal     Status: Abnormal   Collection Time: 09/17/21 11:19 AM   Specimen: Nasal Mucosa; Nasal Swab  Result Value Ref Range Status   MRSA by PCR Next Gen DETECTED (A) NOT DETECTED Final    Comment: RESULT CALLED TO, READ BACK BY AND VERIFIED WITH: PATRICIA RN @ 1302 ON [id number] BY HENDERSON L (NOTE) The GeneXpert MRSA Assay (FDA approved for NASAL specimens only), is one component of a comprehensive MRSA colonization surveillance program. It is not intended to diagnose MRSA infection nor to guide or monitor treatment for MRSA infections. Test performance is not FDA approved in patients less than 21 years old. Performed at [facility], [address] [place], [place] [address]      Time coordinating discharge:  35 minutes  SIGNED: Barton Dubois MD  [facility] 09/20/2021, 10:32 AM

## 2021-09-20 NOTE — Progress Notes (Signed)
Updates on discharge:  Please see clinical details on my discharge summary today.  Patient had been fairly stable so deemed stable for discharge.  Apparently, she had an episode of agitation and did not take morning medications so was given 1 dose of Haldol intramuscular 5 mg.  SNF will not consider admitting her as she has received injectable medication.  Patient took her medicine and remained quiet and composed after the injectable Haldol.  Plan: We will use as needed haloperidol by mouth, avoid injectables.  Monitor today and possible discharge back to SNF tomorrow. I have discussed her care in detail with granddaughter and son on the phone. I have sent a neurology follow-up request. Since patient is going to stay in the hospital, as discussed with family, we will involve palliative care team in the hospital.

## 2021-09-20 NOTE — Plan of Care (Signed)
  Problem: Acute Rehab OT Goals (only OT should resolve) Goal: Pt. Will Perform Grooming Flowsheets (Taken 09/20/2021 1000) Pt Will Perform Grooming:  with min assist  sitting Goal: Pt. Will Perform Upper Body Dressing Flowsheets (Taken 09/20/2021 1000) Pt Will Perform Upper Body Dressing:  with modified independence  sitting Goal: Pt. Will Perform Lower Body Dressing Flowsheets (Taken 09/20/2021 1000) Pt Will Perform Lower Body Dressing:  with min assist  sitting/lateral leans  with adaptive equipment Goal: Pt. Will Transfer To Toilet Flowsheets (Taken 09/20/2021 1000) Pt Will Transfer to Toilet:  with mod assist  stand pivot transfer  bedside commode Goal: Pt/Caregiver Will Perform Home Exercise Program Flowsheets (Taken 09/20/2021 1000) Pt/caregiver will Perform Home Exercise Program:  Increased ROM  Increased strength  Both right and left upper extremity  With minimal assist  Monda Chastain OT, MOT

## 2021-09-20 NOTE — Consult Note (Addendum)
Consultation Note Date: 09/20/2021   Patient Name: [name]  DOB: Jan 13, 1941  MRN: [id number]  Age / Sex: 80 y.o., female  PCP: Joyice Faster, FNP Referring Physician: Barb Merino, MD  Reason for Consultation: Establishing goals of care  HPI/Patient Profile: 80 y.o. 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 Hospitalization complicated by underlying dementia and agitation. Also noted to have severe malnutrition and stage 2 sacral decubitus.   Clinical Assessment and Goals of Care: I met today at [name]'s bedside but no family present. She is very sleepy but does awaken slightly but only moans to me yes or no and then goes right back to sleep. She denies any discomfort. She does not speak to me anymore.   I reached out to granddaughter [name] who has been designated as family spokesperson per Dr. Nena [name] note. I introduced the role of palliative care as one of support and education of disease trajectory and expectations to help plan for the future and make decisions based on what is best for [name]. [name] shares that her grandmother was doing well and living independently until she had knee pain and was hospitalized with DVT 08/17/21. During that hospital stay [name] became confused and she went to rehab but confusion has been consistent since last hospitalization and impacted her ability to participate and improve in rehab. She has since transitioned to long term care. We discussed more about dementia and how this can be triggered and escalated by acute illness and being out of familiar home and environment.   We discussed the importance of quality of  life and considering what is important to [name] and what level of quality of life she would find acceptable. We discussed anticipation of decisions such as code status, artificial feeding, etc that should be thought through prior to an emergent situation so we are making a good and thoughtful decision for what we ask [name] to go through. [name] understands but is overwhelmed by all the new information. They are not prepared at this time to make decisions but understand the importance of ongoing conversations. They are open to continuing conversation with outpatient palliative care. I will leave Hard Choices booklet for [facility].   All questions/concerns addressed. Emotional support provided.   Primary Decision Maker No documented HCPOA Family has designated granddaughter Loss adjuster, chartered    SUMMARY OF RECOMMENDATIONS   - Ongoing discussions recommended - Refer to outpatient palliative care  Code Status/Advance Care Planning: Full code   Symptom Management:  Per attending.   Palliative Prophylaxis:  Aspiration, Bowel Regimen, Delirium Protocol, Oral Care, and Turn Reposition  Prognosis:  Overall poor prognosis with agitation impacting functional status due to likely underlying dementia.   Discharge Planning: [place] for rehab with Palliative care service follow-up      Primary Diagnoses:  Present on Admission:  Sepsis ([place])  Acute metabolic encephalopathy  Severe protein-calorie malnutrition (HCC)  Mood disorder (HCC)  Hyperlipidemia  Hypokalemia   I have reviewed the medical record, interviewed the patient and family, and examined the patient. The following aspects are pertinent.  Past Medical History:  Diagnosis Date   Arthritis    Depression    HTN (hypertension)    Hypercholesteremia    S/P colonoscopy 2009   adenoma, repeat 5 years   Social History   Socioeconomic History   Marital status: Divorced    Spouse name: Not on file   Number of  children: Not on file   Years of education: Not on file   Highest education level: Not on file  Occupational History   Occupation: CNA    Employer: [employer]    Comment: Nurse, learning disability, part-time  Tobacco Use   Smoking status: Never   Smokeless tobacco: Never  Vaping Use   Vaping Use: Never used  Substance and Sexual Activity   Alcohol use: No   Drug use: No   Sexual activity: Never    Birth control/protection: None  Other Topics Concern   Not on file  Social History Narrative   Not on file   Social Determinants of Health   Financial Resource Strain: Not on file  Food Insecurity: Not on file  Transportation Needs: Not on file  Physical Activity: Not on file  Stress: Not on file  Social Connections: Not on file   Family History  Problem Relation Age of Onset   Stroke Brother    Stroke Mother        deceased   Emphysema Father        deceased   Colon cancer Neg Hx    Scheduled Meds:  apixaban  5 mg Oral BID   Chlorhexidine Gluconate Cloth  6 each Topical Q0600   metoprolol tartrate  50 mg Oral BID   mupirocin ointment  1 application Nasal BID   polyethylene glycol  17 g Oral BID   potassium chloride  40 mEq Oral BID   pravastatin  20 mg Oral q1800   QUEtiapine  50 mg Oral BID   Vitamin D (Ergocalciferol)  50,000 Units Oral Weekly   Continuous Infusions: PRN Meds:.acetaminophen **OR** acetaminophen, haloperidol **OR** [DISCONTINUED] haloperidol lactate, ondansetron **OR** ondansetron (ZOFRAN) IV, oxyCODONE No Known Allergies Review of Systems  Unable to perform ROS: Dementia   Physical Exam Vitals and nursing note reviewed.  Constitutional:      General: She is sleeping. She is not in acute distress.    Appearance: She is ill-appearing.  Cardiovascular:     Rate and Rhythm: Tachycardia present.  Pulmonary:     Effort: No tachypnea, accessory muscle usage or respiratory distress.  Abdominal:     General: Abdomen is flat.     Palpations: Abdomen is soft.   Neurological:     Mental Status: She is disoriented and confused.    Vital Signs: BP 132/78 (BP Location: Right Arm)   Pulse (!) 110   Temp 98.2 F (36.8 C)   Resp 18   Ht 5' 9" (1.753 m)   Wt 74.2 kg   SpO2 100%   BMI 24.16 kg/m  Pain Scale: PAINAD   Pain Score: 0-No pain   SpO2: SpO2: 100 % O2 Device:SpO2: 100 % O2 Flow Rate: .   IO: Intake/output summary:  Intake/Output Summary (Last 24 hours) at 09/20/2021 1347 Last data filed at 09/20/2021 0700 Gross per 24  hour  Intake 200 ml  Output 700 ml  Net -500 ml    LBM: Last BM Date: 09/17/21 Baseline Weight: Weight: 84 kg Most recent weight: Weight: 74.2 kg     Palliative Assessment/Data:     Time In: 1540 Time Out: 1630 Time Total: 50 min Greater than 50%  of this time was spent counseling and coordinating care related to the above assessment and plan.  Signed by: Vinie Sill, NP Palliative Medicine Team Pager # [phone] (M-F 8a-5p) Team Phone # [phone] (Nights/Weekends)

## 2021-09-20 NOTE — Progress Notes (Signed)
Pharmacy Antibiotic Note  [name] is a 80 y.o. female admitted on 09/17/2021 with sepsis.  Pharmacy has been consulted for cefepime dosing. Blood Cultures no growth to date. UCX no growth. PCT <0.1.  Plan: Continue Cefepime 2g IV Q12H. F/U cxs and clinical progress F/U LOT and plan Monitor V/S, labs  Height: 5\' 9"  (175.3 cm) Weight: 74.2 kg (163 lb 9.3 oz) IBW/kg (Calculated) : 66.2  Temp (24hrs), Avg:98.7 F (37.1 C), Min:98.2 F (36.8 C), Max:99.4 F (37.4 C)  Recent Labs  Lab 09/17/21 0149 09/17/21 0327 09/18/21 0440 09/20/21 0536  WBC 9.9  --  7.4 8.3  CREATININE 0.61  --  0.52 0.44  LATICACIDVEN 2.3* 2.0*  --   --      Estimated Creatinine Clearance: 60.6 mL/min (by C-G formula based on SCr of 0.44 mg/dL).    No Known Allergies  Antimicrobials: Vanco 11/26 >>11/27 Cefepime 11/26 >>  Cultures:  11/26 Bcx: ngtd 11/26 Ucx: ng 11/26 Sputum Cx: pending MRSA PCR: positive  Thank you for allowing pharmacy to be a part of this patient's care. Isac Sarna, BS Pharm D, [place] Clinical Pharmacist Pager [phone] 09/20/2021 9:19 AM

## 2021-09-20 NOTE — TOC Progression Note (Signed)
Transition of Care [facility]) - Progression Note    Patient Details  Name: [name] MRN: [id number] Date of Birth: [birth year]  Transition of Care [facility]) CM/SW Contact  Shade Flood, LCSW Phone Number: 09/20/2021, 11:57 AM  Clinical Narrative:     TOC following. SNF insurance auth still pending. TOC will upload additional requested clinical. Bryson Ha at [facility] states that pt received Haldo at 1053 this AM and now they cannot accept her until she has been chemical and physical restraint-free for 48 hours. Updated MD and RN.  TOC will follow.  Expected Discharge Plan: [place] Barriers to Discharge: Continued Medical Work up  Expected Discharge Plan and Services Expected Discharge Plan: [place]         Expected Discharge Date: 09/20/21                                     Social Determinants of Health (SDOH) Interventions    Readmission Risk Interventions No flowsheet data found.

## 2021-09-20 NOTE — Evaluation (Signed)
Occupational Therapy Evaluation Patient Details Name: [name] MRN: [id number] DOB: 03-11-1941 Today's Date: 09/20/2021   History of Present Illness [name]  is a 80 y.o. female, with history of hypertension, hypercholesterolemia, depression, mood disorder, and more presents to ED with chief complaint of altered mental status.  Patient is responding to commands, and will answer some questions between episodes of falling asleep.  She is somnolent after Ativan.  She does not know why she is here in the ER.  When asked where she is she says she is in a group home.  She is oriented to herself but not to time or place.  Patient is not able to provide any history.  I attempted to reach staff at the [facility] without an answer.  When patient arrived in the ED she was tachycardic, tachypneic, febrile.  Sepsis protocol was started she was given a 3 L bolus.  She was started on vancomycin and cefepime.  Her chest x-ray shows no active disease.  Urine analysis is borderline.  Urine culture and blood cultures pending.  She has no leukocytosis but she does have a thrombocytosis of 491.  Her chemistry panel reveals a hypokalemia and a protein calorie malnutrition.  Her lactic acid was initially 2.3, was 2.0 on repeat but this was before the fluid bolus was finished.  She an EKG that showed sinus tachycardia with a heart rate of 131, QTc 454 no acute ischemic changes.  She was given Tylenol for fever, Ativan for agitation.  Geodon has been ordered but has not been used as of yet.  Admission was requested for work-up of sepsis.  Most likely source at this point is urine, but awaiting cultures.   Clinical Impression   Pt agreeable to OT evaluation but required extended time and convincing to engage in bed mobility. Pt appeared anxious about falling. Pt required max A for supine to sit mobility and mod to max A for sit to supine. Pt grimaced and moaned reporting "tailbone" pain with mobility. Pt  demonstrates general weakness but resisted P/ROM to shoulder. Pt will benefit from continued OT in the hospital and recommended venue below to increase strength, balance, and endurance for safe ADL's.         Recommendations for follow up therapy are one component of a multi-disciplinary discharge planning process, led by the attending physician.  Recommendations may be updated based on patient status, additional functional criteria and insurance authorization.   Follow Up Recommendations  Skilled nursing-short term rehab (<3 hours/day)    Assistance Recommended at Discharge Frequent or constant Supervision/Assistance  Functional Status Assessment  Patient has had a recent decline in their functional status and demonstrates the ability to make significant improvements in function in a reasonable and predictable amount of time.  Equipment Recommendations  None recommended by OT    Recommendations for Other Services       Precautions / Restrictions Precautions Precautions: Fall Restrictions Weight Bearing Restrictions: No      Mobility Bed Mobility Overal bed mobility: Needs Assistance Bed Mobility: Supine to Sit;Sit to Supine     Supine to sit: Max assist;HOB elevated Sit to supine: Mod assist;Max assist   General bed mobility comments: Pt frequently complained of pain and need to rest between attempts at bed mobility.    Transfers                          Balance Overall balance assessment: Needs assistance Sitting-balance  support: Bilateral upper extremity supported;Feet supported Sitting balance-Leahy Scale: Poor Sitting balance - Comments: R side lean on R UE. Seated at EOB poor to fair Postural control: Right lateral lean                                 ADL either performed or assessed with clinical judgement   ADL Overall ADL's : Needs assistance/impaired                     Lower Body Dressing: Maximal assistance Lower Body  Dressing Details (indicate cue type and reason): Per clinical judgement based on pt's refusal to attempt and difficulty with bed mobility.                     Vision Baseline Vision/History: 1 Wears glasses Ability to See in Adequate Light: 0 Adequate Patient Visual Report: No change from baseline Vision Assessment?: No apparent visual deficits Additional Comments: Pt did often have eyes nearly closed during session.                Pertinent Vitals/Pain Pain Assessment: Faces Faces Pain Scale: Hurts even more Pain Location: "tail bone" Pain Descriptors / Indicators: Grimacing;Guarding;Moaning Pain Intervention(s): Limited activity within patient's tolerance;Monitored during session;Repositioned     Hand Dominance Right   Extremity/Trunk Assessment Upper Extremity Assessment Upper Extremity Assessment: RUE deficits/detail;LUE deficits/detail RUE Deficits / Details: Limited to 2+/5 MMT for shoulder flexion supine with HOB elevated. Pt able to bear weight and grab rail for bed mobility. LUE Deficits / Details: 3-/5 shoulder flexion MMT supine in bed with HOB elevated. Able to bear weight through L UE while seated at EOB.   Lower Extremity Assessment Lower Extremity Assessment: Defer to PT evaluation       Communication Communication Communication: No difficulties   Cognition Arousal/Alertness: Awake/alert Behavior During Therapy: Anxious (Pt fearful of mobility at times.) Overall Cognitive Status: Within Functional Limits for tasks assessed                                                        Home Living Family/patient expects to be discharged to:: Skilled nursing facility                                 Additional Comments: Patient is a questionable historian stating she lives alone and is indepndent with ADL; Per chart she is coming from SNF      Prior Functioning/Environment Prior Level of Function :  Independent/Modified Independent             Mobility Comments: household ambulator without AD, per document review ADLs Comments: Pt reports independence.        OT Problem List: Decreased strength;Decreased range of motion;Decreased activity tolerance;Impaired balance (sitting and/or standing)      OT Treatment/Interventions: Self-care/ADL training;Therapeutic exercise;Patient/family education;Therapeutic activities;Balance training;DME and/or AE instruction    OT Goals(Current goals can be found in the care plan section) Acute Rehab OT Goals Patient Stated Goal: Pt open to rehab. OT Goal Formulation: With patient Time For Goal Achievement: 10/04/21 Potential to Achieve Goals: Fair  OT Frequency: Min 2X/week    End of Session Nurse Communication:  Other (comment) (Notified pt was in bed with purewick out.)  Activity Tolerance: Patient limited by pain Patient left: in bed;with call bell/phone within reach;with bed alarm set  OT Visit Diagnosis: Unsteadiness on feet (R26.81);Muscle weakness (generalized) (M62.81)                Time: 2824-1753 OT Time Calculation (min): 22 min Charges:  OT General Charges $OT Visit: 1 Visit OT Evaluation $OT Eval Moderate Complexity: 1 Mod  Faustino Luecke OT, MOT  Larey Seat 09/20/2021, 9:58 AM

## 2021-09-21 DIAGNOSIS — N390 Urinary tract infection, site not specified: Secondary | ICD-10-CM

## 2021-09-21 DIAGNOSIS — L89152 Pressure ulcer of sacral region, stage 2: Secondary | ICD-10-CM

## 2021-09-21 MED ORDER — HALOPERIDOL 5 MG PO TABS: 5.0000 mg | ORAL_TABLET | Freq: Three times a day (TID) | ORAL | Status: DC | PRN

## 2021-09-21 NOTE — TOC Progression Note (Signed)
Transition of Care [facility]) - Progression Note    Patient Details  Name: [name] MRN: [id number] Date of Birth: 1941/04/25  Transition of Care [facility] CM/SW Contact  Ihor Gully, LCSW Phone Number: 09/21/2021, 3:24 PM  Clinical Narrative:    [employer] denied SNF auth. Patient has medicaid. Ebony Hail at facility notified of denial. Patient has to be 48 hours chemical and physical restraint free before she can admit to facility.    Expected Discharge Plan: [place] Barriers to Discharge: Continued Medical Work up  Expected Discharge Plan and Services Expected Discharge Plan: [place]         Expected Discharge Date: 09/20/21                                     Social Determinants of Health (SDOH) Interventions    Readmission Risk Interventions No flowsheet data found.

## 2021-09-21 NOTE — Progress Notes (Signed)
OT Cancellation Note  Patient Details Name: [name] MRN: [id number] DOB: [birth year]   Cancelled Treatment:    Reason Eval/Treat Not Completed: Pain limiting ability to participate. Pt reporting 8/10 L knee pain and requested to not have an OT treatment session.   Arwa Yero OT, MOT   Larey Seat 09/21/2021, 10:16 AM

## 2021-09-21 NOTE — Progress Notes (Addendum)
Physical Therapy Treatment Patient Details Name: [name] MRN: [id number] DOB: 1941-05-08 Today's Date: 09/21/2021   History of Present Illness [name]  is a 80 y.o. female, with history of hypertension, hypercholesterolemia, depression, mood disorder, and more presents to ED with chief complaint of altered mental status.  Patient is responding to commands, and will answer some questions between episodes of falling asleep.  She is somnolent after Ativan.  She does not know why she is here in the ER.  When asked where she is she says she is in a group home.  She is oriented to herself but not to time or place.  Patient is not able to provide any history.  I attempted to reach staff at the [facility] without an answer.  When patient arrived in the ED she was tachycardic, tachypneic, febrile.  Sepsis protocol was started she was given a 3 L bolus.  She was started on vancomycin and cefepime.  Her chest x-ray shows no active disease.  Urine analysis is borderline.  Urine culture and blood cultures pending.  She has no leukocytosis but she does have a thrombocytosis of 491.  Her chemistry panel reveals a hypokalemia and a protein calorie malnutrition.  Her lactic acid was initially 2.3, was 2.0 on repeat but this was before the fluid bolus was finished.  She an EKG that showed sinus tachycardia with a heart rate of 131, QTc 454 no acute ischemic changes.  She was given Tylenol for fever, Ativan for agitation.  Geodon has been ordered but has not been used as of yet.  Admission was requested for work-up of sepsis.  Most likely source at this point is urine, but awaiting cultures.    PT Comments    Patient presents in bed awake and alert, required gentle encouragement to participate in therapy. Patient continues to require Mod/Max assist for bed mobility, demonstrated slow labored movement, continues to c/o pain in L knee. Patient demonstrated fair carryover for a seated exercise program at EOB,  requires verbal and visual cuing and gentle encouragement, demonstrates partial range of movement for each exercise. Patient unable to stand using RW due to generalized weakness and apprehension. Patient will benefit from continued skilled physical therapy in hospital and recommended venue below to increase strength, balance, endurance for safe ADLs and gait.   Recommendations for follow up therapy are one component of a multi-disciplinary discharge planning process, led by the attending physician.  Recommendations may be updated based on patient status, additional functional criteria and insurance authorization.  Follow Up Recommendations  Skilled nursing-short term rehab (<3 hours/day)     Assistance Recommended at Discharge Intermittent Supervision/Assistance  Equipment Recommendations  None recommended by PT    Recommendations for Other Services       Precautions / Restrictions Precautions Precautions: Fall Restrictions Weight Bearing Restrictions: No     Mobility  Bed Mobility Overal bed mobility: Needs Assistance Bed Mobility: Supine to Sit;Sit to Supine     Supine to sit: Mod assist;Max assist Sit to supine: Mod assist;Max assist   General bed mobility comments: HOB slightly elevated, Slow labored movement, required physical assist    Transfers                   General transfer comment: Patient unable to stand due to generalized weakness and apprehension    Ambulation/Gait                   Stairs  Wheelchair Mobility    Modified Rankin (Stroke Patients Only)       Balance Overall balance assessment: Needs assistance Sitting-balance support: Bilateral upper extremity supported;Feet supported Sitting balance-Leahy Scale: Poor Sitting balance - Comments: R side lean on R UE. Seated at EOB poor to fair Postural control: Right lateral lean                                  Cognition Arousal/Alertness:  Awake/alert Behavior During Therapy: Anxious Overall Cognitive Status: Within Functional Limits for tasks assessed                                          Exercises General Exercises - Lower Extremity Long Arc Quad: Seated;AROM;Strengthening;Both;10 reps Hip Flexion/Marching: Seated;AROM;Strengthening;Both;5 reps Toe Raises: Seated;AROM;Strengthening;Both;10 reps Heel Raises: Seated;AROM;Strengthening;Both;10 reps    General Comments        Pertinent Vitals/Pain Pain Assessment: Faces Faces Pain Scale: No hurt    Home Living                          Prior Function            PT Goals (current goals can now be found in the care plan section) Acute Rehab PT Goals Patient Stated Goal: decrease pain PT Goal Formulation: With patient Time For Goal Achievement: 10/02/21 Potential to Achieve Goals: Fair Progress towards PT goals: Progressing toward goals    Frequency    Min 3X/week      PT Plan Current plan remains appropriate    Co-evaluation              AM-PAC PT "6 Clicks" Mobility   Outcome Measure  Help needed turning from your back to your side while in a flat bed without using bedrails?: A Lot Help needed moving from lying on your back to sitting on the side of a flat bed without using bedrails?: A Lot Help needed moving to and from a bed to a chair (including a wheelchair)?: A Lot Help needed standing up from a chair using your arms (e.g., wheelchair or bedside chair)?: A Lot Help needed to walk in hospital room?: A Lot Help needed climbing 3-5 steps with a railing? : Total 6 Click Score: 11    End of Session Equipment Utilized During Treatment: Gait belt Activity Tolerance: Patient tolerated treatment well;Patient limited by pain Patient left: in bed;with call bell/phone within reach;with bed alarm set Nurse Communication: Mobility status PT Visit Diagnosis: Other abnormalities of gait and mobility (R26.89);Muscle  weakness (generalized) (M62.81)     Time: 8889-1694 PT Time Calculation (min) (ACUTE ONLY): 30 min  Charges:  $Therapeutic Exercise: 8-22 mins $Therapeutic Activity: 8-22 mins                     Cassie Jones, SPT  During this treatment session, the therapist was present, participating in and directing the treatment.  2:04 PM, 09/21/21 Lonell Grandchild, MPT Physical Therapist with [facility] 336 [phone] office [phone] mobile phone

## 2021-09-21 NOTE — Progress Notes (Signed)
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  Unable to give history.  In the emergency room she was tachycardic and tachypneic with temperature of 101.  Chest x-ray was normal.  Urine analysis with abnormal findings.  Cultures were drawn and patient was treated as sepsis protocol and admitted to the hospital.   Assessment & Plan:   Principal Problem:   Sepsis ([place]) Active Problems:   Hypokalemia   Hyperlipidemia   Acute metabolic encephalopathy   Severe protein-calorie malnutrition (HCC)   Mood disorder (HCC)   Pressure injury of skin  Sepsis present on admission suspect secondary to UTI: Blood cultures negative so far.   -Urine cultures negative so far.   -Afebrile and denies dysuria.   -Patient antibiotic has been discontinue after a total of 4 days treatment. -Maintain adequate hydration.  Acute metabolic encephalopathy/agitation and disorientation in a patient with dementia: -CT head negative.  Infection work-up as above.  Patient is with recently worsening behavioral issues due to underlying dementia. -All-time fall precautions.  Delirium precautions. Continue Seroquel 50 mg twice a day -Continue constant reorientation -Recent investigations including TSH, I69 folic acid were normal. -MRI of the brain and MRA of the brain with poor quality but no significant abnormalities. -Patient will require outpatient follow-up with neurology service for further evaluation in the setting of underlying dementia. -Low-dose Namenda To try it if further behavioral disturbances appreciated.  History of DVT:  -Patient is chronically on Eliquis.  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   -Not a candidate for intervention or clot retrieval.   -Continue anticoagulation.  Severe protein calorie malnutrition:  -Patient encouraged to continue oral intake -Continue feeding supplements.  "Pressure Injury 09/17/21 Buttocks Medial Stage 2 -  Partial thickness loss of dermis presenting as a shallow open injury with a red, pink wound bed without slough. Present on admission: yes" No signs of superimposed infection.  DVT prophylaxis: SCDs Start: 09/17/21 0655 apixaban (ELIQUIS) tablet 5 mg   Code Status: Full code Family Communication: Granddaughter at the bedside. Disposition Plan: Status is: Inpatient  Remains inpatient appropriate because: Altered mental status and physical deconditioning; unsafe to go home without 24-hour care monitoring/assistance.   Consultants:  Palliative care  Procedures:  None  Antimicrobials:  Vancomycin and cefepime 11/25----11/28 Cefepime 11/25---09/20/21  Subjective: Calmer, Following commands and in no major distress today.  Patient is afebrile and has been cooperative with her care and taking oral medications.  Objective: Vitals:   09/20/21 2058 09/21/21 0518 09/21/21 0745 09/21/21 1307  BP: (!) 109/52 121/69  124/85  Pulse: (!) 110 (!) 116 (!) 106 100  Resp: 19 19  16   Temp: (!) 97.2 F (36.2 C) 98 F (36.7 C)  98.3 F (36.8 C)  TempSrc:    Oral  SpO2: 97% 99%  97%  Weight:      Height:        Intake/Output Summary (Last 24 hours) at 09/21/2021 1915 Last data filed at 09/21/2021 1700 Gross per 24 hour  Intake 360 ml  Output 400 ml  Net -40 ml   Filed Weights   09/17/21 0129 09/17/21 0134 09/17/21 0649  Weight: 84 kg 76.4 kg 74.2 kg  Examination: General exam: Alert, awake, oriented x 1-2 intermittently; following simple commands and so far taking her medications and collaborating with care.  Patient experienced isolated event of significant agitation on 09/20/2021 but  ended requiring the use of Haldol injection.  No further as needed medications has been required since then. Respiratory system: Clear to auscultation bilaterally; no requiring oxygen supplementation.  Good saturation. Cardiovascular system: Irregular. No rubs or gallops; no JVD. Gastrointestinal system: Abdomen is nondistended, soft and nontender. No organomegaly or masses felt. Normal bowel sounds heard. Central nervous system: No focal neurological deficits. Extremities: No cyanosis or clubbing.  Left lower extremity chronically swollen due to venous stasis disease changes. Skin: No rashes, lesions or ulcers Psychiatry: Judgement and insight appear impaired secondary to underlying dementia; Mood & affect appropriate at this time.   Data Reviewed: I have personally reviewed following labs and imaging studies  CBC: Recent Labs  Lab 09/17/21 0149 09/18/21 0440 09/20/21 0536  WBC 9.9 7.4 8.3  NEUTROABS 7.6 5.3 6.1  HGB 11.3* 9.7* 10.5*  HCT 35.9* 32.1* 34.1*  MCV 86.3 88.2 85.5  PLT 491* 392 701*   Basic Metabolic Panel: Recent Labs  Lab 09/17/21 0149 09/18/21 0440 09/20/21 0536  NA 143 144 141  K 3.1* 3.4* 3.2*  CL 106 113* 110  CO2 23 24 24   GLUCOSE 125* 102* 114*  BUN 31* 20 13  CREATININE 0.61 0.52 0.44  CALCIUM 9.3 8.6* 8.4*  MG  --  1.7 1.7  PHOS  --   --  2.5   GFR: Estimated Creatinine Clearance: 60.6 mL/min (by C-G formula based on SCr of 0.44 mg/dL).  Liver Function Tests: Recent Labs  Lab 09/17/21 0149 09/18/21 0440 09/20/21 0536  AST 44* 32 16  ALT 43 36 28  ALKPHOS 53 41 42  BILITOT 1.1 0.7 0.8  PROT 6.8 5.4* 5.4*  ALBUMIN 2.2* 1.7* 1.7*    Coagulation Profile: Recent Labs  Lab 09/17/21 0149 09/18/21 0440  INR 2.5* 2.2*   Sepsis Labs: Recent Labs  Lab 09/17/21 0149 09/17/21 0327 09/17/21 0737  PROCALCITON  --   --  <0.10  LATICACIDVEN 2.3* 2.0*  --     Recent Results (from the past 240 hour(s))  Blood Culture (routine x 2)      Status: None (Preliminary result)   Collection Time: 09/17/21  1:50 AM   Specimen: Left Antecubital; Blood  Result Value Ref Range Status   Specimen Description   Final    LEFT ANTECUBITAL BOTTLES DRAWN AEROBIC AND ANAEROBIC   Special Requests Blood Culture adequate volume  Final   Culture   Final    NO GROWTH 4 DAYS Performed at [facility], [address], [place], [place] [address]    Report Status PENDING  Incomplete  Blood Culture (routine x 2)     Status: None (Preliminary result)   Collection Time: 09/17/21  1:56 AM   Specimen: BLOOD LEFT FOREARM  Result Value Ref Range Status   Specimen Description   Final    BLOOD LEFT FOREARM BOTTLES DRAWN AEROBIC AND ANAEROBIC   Special Requests Blood Culture adequate volume  Final   Culture   Final    NO GROWTH 4 DAYS Performed at [facility], [address], [place], [place] [address]    Report Status PENDING  Incomplete  Resp Panel by RT-PCR (Flu A&B, Covid) Nasopharyngeal Swab     Status: None   Collection Time: 09/17/21  3:18 AM   Specimen: Nasopharyngeal Swab; Nasopharyngeal(NP)  swabs in vial transport medium  Result Value Ref Range Status   SARS Coronavirus 2 by RT PCR NEGATIVE NEGATIVE Final    Comment: (NOTE) SARS-CoV-2 target nucleic acids are NOT DETECTED.  The SARS-CoV-2 RNA is generally detectable in upper respiratory specimens during the acute phase of infection. The lowest concentration of SARS-CoV-2 viral copies this assay can detect is 138 copies/mL. A negative result does not preclude SARS-Cov-2 infection and should not be used as the sole basis for treatment or other patient management decisions. A negative result may occur with  improper specimen collection/handling, submission of specimen other than nasopharyngeal swab, presence of viral mutation(s) within the areas targeted by this assay, and inadequate number of viral copies(<138 copies/mL). A negative result must be combined with clinical observations,  patient history, and epidemiological information. The expected result is Negative.  Fact Sheet for Patients:  EntrepreneurPulse.com.au  Fact Sheet for Healthcare Providers:  IncredibleEmployment.be  This test is no t yet approved or cleared by the [place] FDA and  has been authorized for detection and/or diagnosis of SARS-CoV-2 by FDA under an Emergency Use Authorization (EUA). This EUA will remain  in effect (meaning this test can be used) for the duration of the COVID-19 declaration under Section 564(b)(1) of the Act, 21 U.S.C.section 360bbb-3(b)(1), unless the authorization is terminated  or revoked sooner.       Influenza A by PCR NEGATIVE NEGATIVE Final   Influenza B by PCR NEGATIVE NEGATIVE Final    Comment: (NOTE) The Xpert Xpress SARS-CoV-2/FLU/RSV plus assay is intended as an aid in the diagnosis of influenza from Nasopharyngeal swab specimens and should not be used as a sole basis for treatment. Nasal washings and aspirates are unacceptable for Xpert Xpress SARS-CoV-2/FLU/RSV testing.  Fact Sheet for Patients: EntrepreneurPulse.com.au  Fact Sheet for Healthcare Providers: IncredibleEmployment.be  This test is not yet approved or cleared by the [place] FDA and has been authorized for detection and/or diagnosis of SARS-CoV-2 by FDA under an Emergency Use Authorization (EUA). This EUA will remain in effect (meaning this test can be used) for the duration of the COVID-19 declaration under Section 564(b)(1) of the Act, 21 U.S.C. section 360bbb-3(b)(1), unless the authorization is terminated or revoked.  Performed at [facility], [address] [place], [place] [address]   Urine Culture     Status: None   Collection Time: 09/17/21  4:20 AM   Specimen: In/Out Cath Urine  Result Value Ref Range Status   Specimen Description   Final    IN/OUT CATH URINE Performed at [facility], [address], [place], [place] [address]    Special Requests   Final    NONE Performed at [facility], [address], [place], [place] [address]    Culture   Final    NO GROWTH Performed at [facility], [place] [address], [place], [place] [address]    Report Status 09/18/2021 FINAL  Final  MRSA Next Gen by PCR, Nasal     Status: Abnormal   Collection Time: 09/17/21 11:19 AM   Specimen: Nasal Mucosa; Nasal Swab  Result Value Ref Range Status   MRSA by PCR Next Gen DETECTED (A) NOT DETECTED Final    Comment: RESULT CALLED TO, READ BACK BY AND VERIFIED WITH: PATRICIA RN @ 1302 ON [id number] BY HENDERSON L (NOTE) The GeneXpert MRSA Assay (FDA approved for NASAL specimens only), is one component of a comprehensive MRSA colonization surveillance program. It is not intended to diagnose MRSA  infection nor to guide or monitor treatment for MRSA infections. Test performance is not FDA approved in patients less than 30 years old. Performed at [facility], [address] [place], [place] [address]      Radiology Studies: No results found.   Scheduled Meds:  apixaban  5 mg Oral BID   Chlorhexidine Gluconate Cloth  6 each Topical Q0600   metoprolol tartrate  50 mg Oral BID   mupirocin ointment  1 application Nasal BID   polyethylene glycol  17 g Oral BID   potassium chloride  40 mEq Oral BID   pravastatin  20 mg Oral q1800   QUEtiapine  50 mg Oral BID   Vitamin D (Ergocalciferol)  50,000 Units Oral Weekly   Continuous Infusions:     LOS: 4 days    Time spent: 30 minutes    Barton Dubois, MD [facility] Pager [phone]

## 2021-09-22 ENCOUNTER — Telehealth: Payer: Self-pay

## 2021-09-22 ENCOUNTER — Inpatient Hospital Stay (INDEPENDENT_AMBULATORY_CARE_PROVIDER_SITE_OTHER): Payer: Medicare Other

## 2021-09-22 DIAGNOSIS — I499 Cardiac arrhythmia, unspecified: Secondary | ICD-10-CM

## 2021-09-22 LAB — CULTURE, BLOOD (ROUTINE X 2)
Culture: NO GROWTH
Culture: NO GROWTH
Special Requests: ADEQUATE
Special Requests: ADEQUATE

## 2021-09-22 NOTE — Care Management Important Message (Signed)
Important Message  Patient Details  Name: [name] MRN: [id number] Date of Birth: 02-08-1941   Medicare Important Message Given:  Yes (mailed to address on file, spoke with granddaughter, Evelean Bigler)     Tommy Medal 09/22/2021, 1:15 PM

## 2021-09-22 NOTE — TOC Transition Note (Signed)
Transition of Care [facility]) - CM/SW Discharge Note   Patient Details  Name: [name] MRN: [id number] Date of Birth: 1941-10-10  [facility] CM/SW Contact:  Ihor Gully, LCSW Phone Number: 09/22/2021, 11:34 AM   Clinical Narrative:    Discharge clinicals sent to facility. Granddaughter notified. EMS contacted to transport. RN to call report.    Final next level of care: Skilled Nursing Facility Barriers to Discharge: No Barriers Identified   Patient Goals and CMS Choice Patient states their goals for this hospitalization and ongoing recovery are:: Return to SNF CMS Medicare.gov Compare Post Acute Care list provided to:: Patient Choice offered to / list presented to : Patient  Discharge Placement              Patient chooses bed at: [facility] Patient to be transferred to facility by: [facility] Name of family member notified: granddaughter    Discharge Plan and Services                                     Social Determinants of Health (SDOH) Interventions     Readmission Risk Interventions No flowsheet data found.

## 2021-09-22 NOTE — Telephone Encounter (Signed)
30 day Prevent[employer] monitor placed on patient in room 329 yesterday for change in mental status, possible cardiac arrhythmia. Instructions  given to patient nurse, Hildred Alamin. Pt scheduled to go to SNF today.

## 2021-09-22 NOTE — Progress Notes (Signed)
Occupational Therapy Treatment Patient Details Name: [name] MRN: [id number] DOB: [birth year] Today's Date: 09/22/2021   History of present illness [name]  is a 80 y.o. female, with history of hypertension, hypercholesterolemia, depression, mood disorder, and more presents to ED with chief complaint of altered mental status.  Patient is responding to commands, and will answer some questions between episodes of falling asleep.  She is somnolent after Ativan.  She does not know why she is here in the ER.  When asked where she is she says she is in a group home.  She is oriented to herself but not to time or place.  Patient is not able to provide any history.  I attempted to reach staff at the [facility] without an answer.  When patient arrived in the ED she was tachycardic, tachypneic, febrile.  Sepsis protocol was started she was given a 3 L bolus.  She was started on vancomycin and cefepime.  Her chest x-ray shows no active disease.  Urine analysis is borderline.  Urine culture and blood cultures pending.  She has no leukocytosis but she does have a thrombocytosis of 491.  Her chemistry panel reveals a hypokalemia and a protein calorie malnutrition.  Her lactic acid was initially 2.3, was 2.0 on repeat but this was before the fluid bolus was finished.  She an EKG that showed sinus tachycardia with a heart rate of 131, QTc 454 no acute ischemic changes.  She was given Tylenol for fever, Ativan for agitation.  Geodon has been ordered but has not been used as of yet.  Admission was requested for work-up of sepsis.  Most likely source at this point is urine, but awaiting cultures.   OT comments  Pt agreeable to OT treatment. Pt able to complete supine to sit bed mobility with mod to max A with noted improvement in seated EOB balance with initial R side lean progressing to no lean and ability to sit at bedside without UE support. Pt was anxious and fearful of attempting to stand this date. Pt  required max A to complete sit to supine bed mobility with tactile and verbal encouragement. Pt left in bed with hospital staff present and bed alarm set. Pt will benefit from continued OT in the hospital and recommended venue below to increase strength, balance, and endurance for safe ADL's.      Recommendations for follow up therapy are one component of a multi-disciplinary discharge planning process, led by the attending physician.  Recommendations may be updated based on patient status, additional functional criteria and insurance authorization.    Follow Up Recommendations  Skilled nursing-short term rehab (<3 hours/day)    Assistance Recommended at Discharge Frequent or constant Supervision/Assistance  Equipment Recommendations  None recommended by OT    Recommendations for Other Services      Precautions / Restrictions Precautions Precautions: Fall Restrictions Weight Bearing Restrictions: No       Mobility Bed Mobility Overal bed mobility: Needs Assistance Bed Mobility: Supine to Sit;Sit to Supine     Supine to sit: Mod assist;Max assist Sit to supine: Max assist   General bed mobility comments: HOB elevated, Slow labored movement, required physical assist and encouragement via tactile and verbal cuing.    Transfers                         Balance Overall balance assessment: Needs assistance Sitting-balance support: Feet supported;No upper extremity supported Sitting balance-Leahy Scale: [place] Sitting  balance - Comments: fair to good seated at EOB                                   ADL either performed or assessed with clinical judgement   ADL Overall ADL's : Needs assistance/impaired     Grooming: Supervision/safety;Set up;Sitting Grooming Details (indicate cue type and reason): Pt able to wash face while seated at EOB.                                      Cognition Arousal/Alertness: Awake/alert Behavior During  Therapy: Anxious Overall Cognitive Status: No family/caregiver present to determine baseline cognitive functioning                                 General Comments: Pt fearful of falling.                            Pertinent Vitals/ Pain       Pain Assessment: Faces Faces Pain Scale: Hurts even more Pain Location: Seeming to be B LE with movement Pain Descriptors / Indicators: Grimacing;Guarding;Moaning Pain Intervention(s): Limited activity within patient's tolerance;Monitored during session;Repositioned                                                          Frequency  Min 2X/week        Progress Toward Goals  OT Goals(current goals can now be found in the care plan section)  Progress towards OT goals: Progressing toward goals  ADL Goals Pt Will Perform Grooming: with min assist;sitting Pt Will Perform Upper Body Dressing: with modified independence;sitting Pt Will Perform Lower Body Dressing: with min assist;sitting/lateral leans;with adaptive equipment Pt Will Transfer to Toilet: with mod assist;stand pivot transfer;bedside commode Pt/caregiver will Perform Home Exercise Program: Increased ROM;Increased strength;Both right and left upper extremity;With minimal assist  Plan Discharge plan remains appropriate                                    End of Session    OT Visit Diagnosis: Unsteadiness on feet (R26.81);Muscle weakness (generalized) (M62.81)   Activity Tolerance Patient limited by pain;Other (comment) (Pt also anxious and fearful of standing.)   Patient Left in bed;with call bell/phone within reach;with bed alarm set;with family/visitor present   Nurse Communication          Time: 9758-8325 OT Time Calculation (min): 19 min  Charges: OT General Charges $OT Visit: 1 Visit OT Treatments $Self Care/Home Management : 8-22 mins  Annalissa Murphey OT, MOT  Larey Seat 09/22/2021, 9:12 AM

## 2021-09-26 ENCOUNTER — Other Ambulatory Visit (HOSPITAL_COMMUNITY): Payer: Medicare Other

## 2021-09-26 ENCOUNTER — Encounter (HOSPITAL_COMMUNITY): Payer: Medicare Other

## 2021-09-26 ENCOUNTER — Encounter (HOSPITAL_COMMUNITY): Admission: RE | Admit: 2021-09-26 | Payer: Medicare Other | Source: Ambulatory Visit

## 2021-10-04 ENCOUNTER — Ambulatory Visit (HOSPITAL_COMMUNITY): Payer: Medicare Other | Admitting: Hematology

## 2021-10-23 DEATH — deceased

## 2021-11-03 ENCOUNTER — Ambulatory Visit: Payer: Medicare Other | Admitting: Neurology

## 2021-11-03 ENCOUNTER — Encounter: Payer: Self-pay | Admitting: Neurology

## 2022-06-07 IMAGING — MG DIGITAL SCREENING BILAT W/ TOMO W/ CAD
6 of 10 series · 6 of 30 positions shown · non-contrast
Comparison: Previous exam(s).

CLINICAL DATA: Screening.

EXAM:
DIGITAL SCREENING BILATERAL MAMMOGRAM WITH TOMO AND CAD

[L MLO synth-2D (1 of 2)]
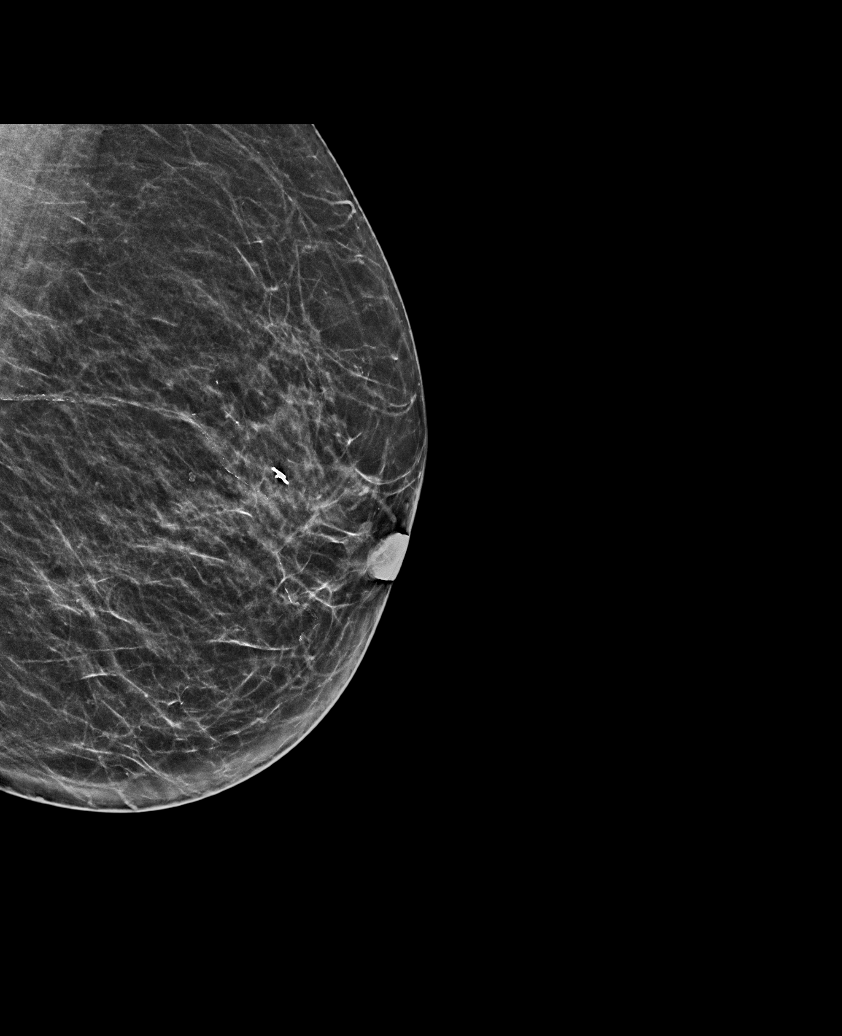

[R CC synth-2D]
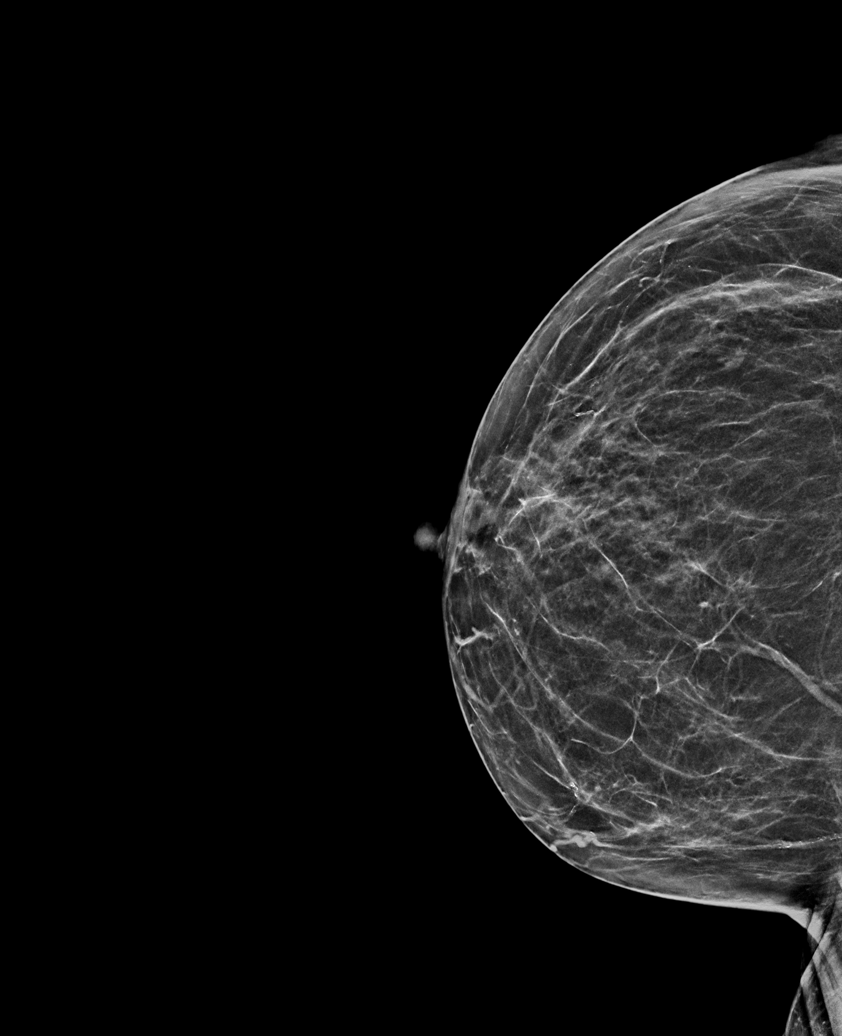

[L CC synth-2D]
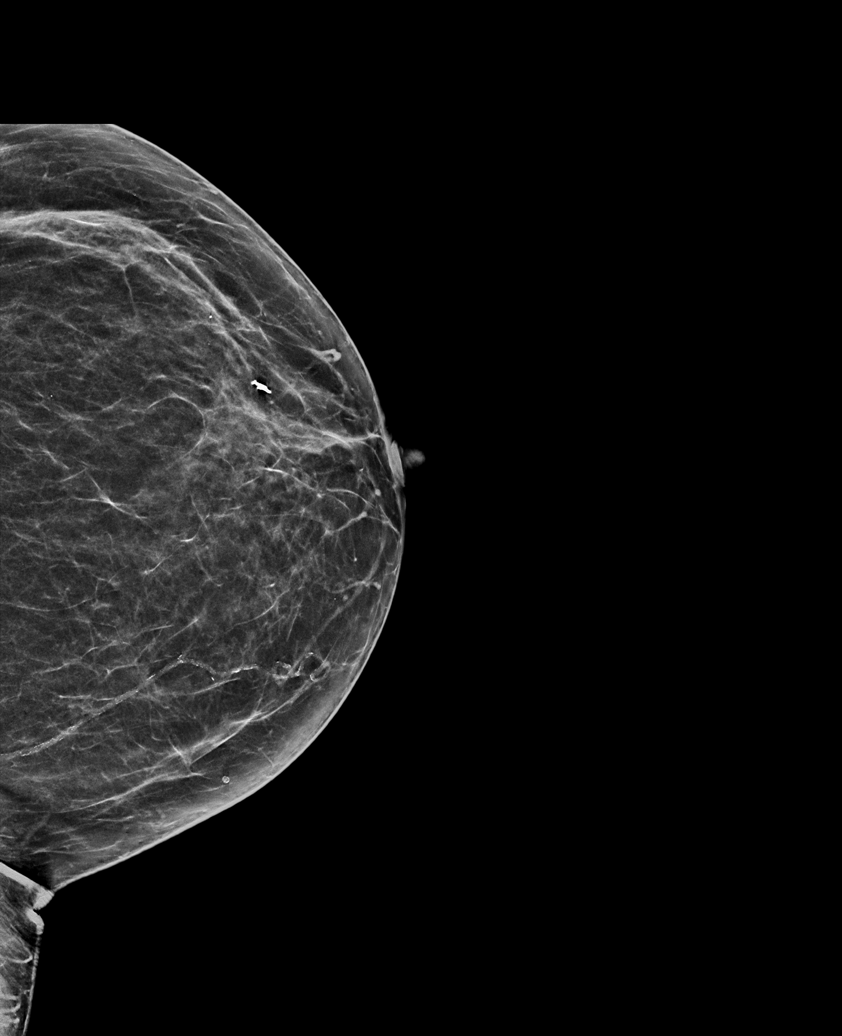

[R MLO synth-2D]
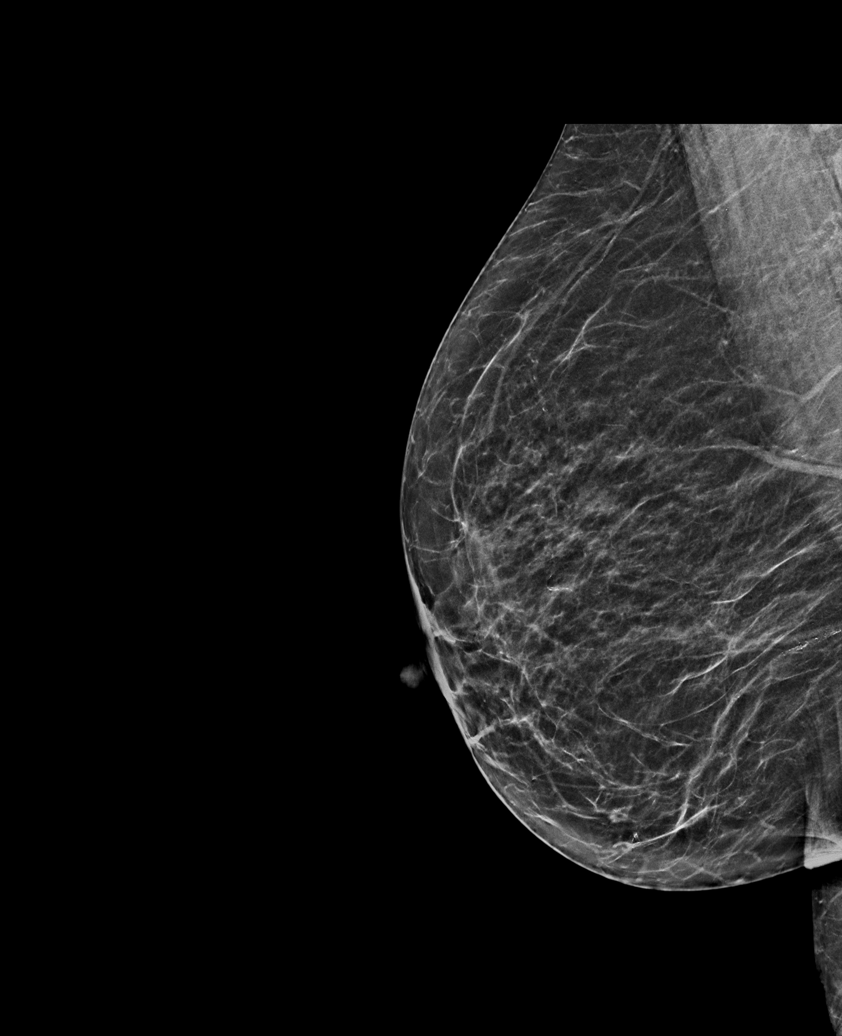

[L MLO synth-2D (2 of 2)]
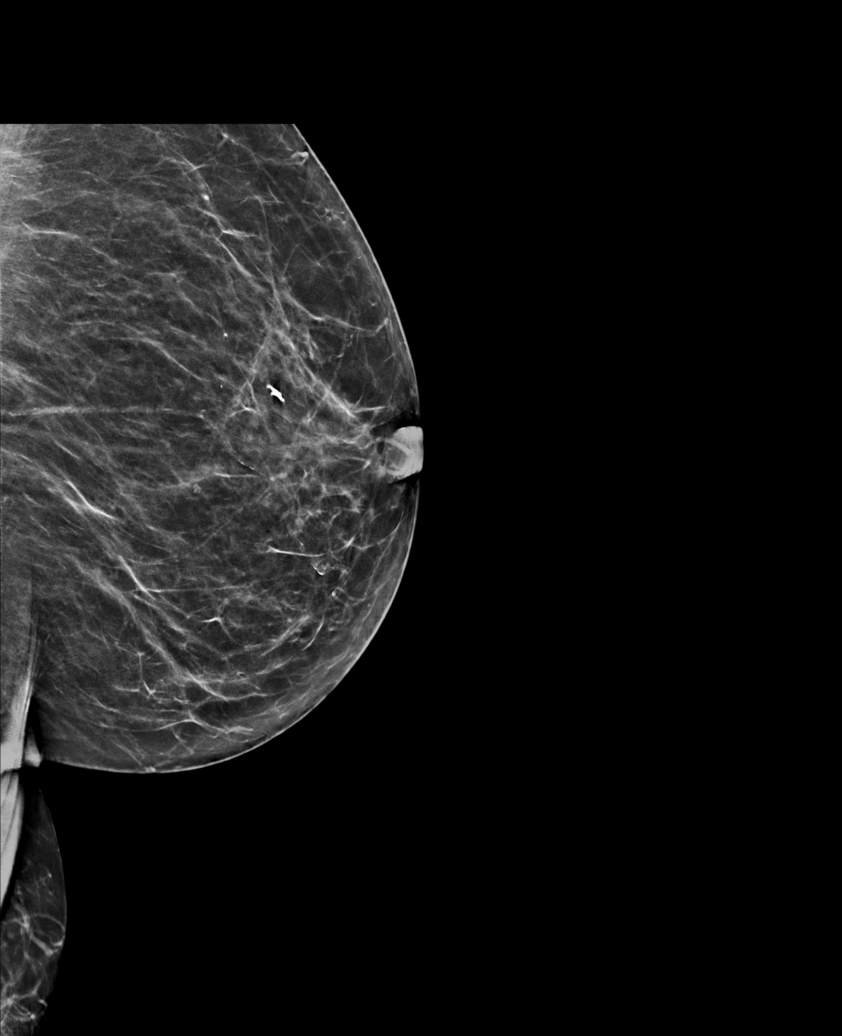

[R MLO tomo · tomo slice 29/58.0]
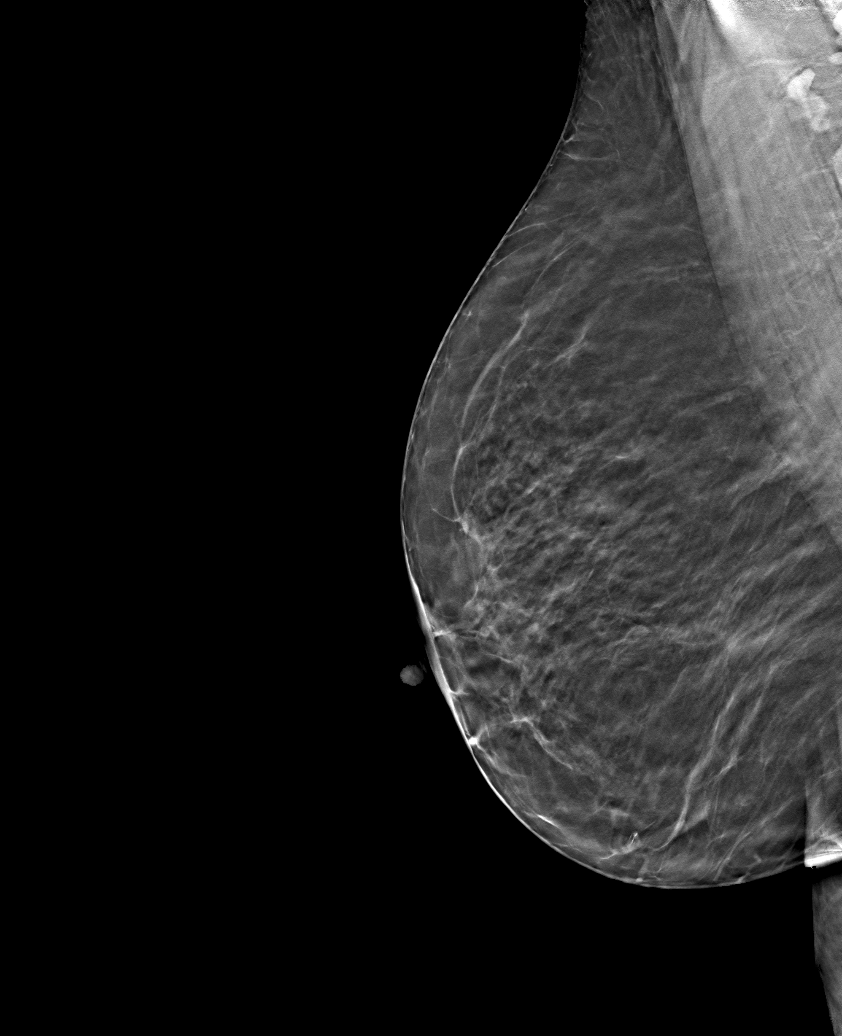

[6 of 30 positions shown; findings below may reference images not displayed]

ACR Breast Density Category b: There are scattered areas of
fibroglandular density.
FINDINGS: There are no findings suspicious for malignancy. Images were
processed with CAD.
IMPRESSION: No mammographic evidence of malignancy. A result letter of this
screening mammogram will be mailed directly to the patient.

RECOMMENDATION:
Screening mammogram in one year. (Code:[id number])

BI-RADS CATEGORY  1: Negative.
